# Patient Record
Sex: Male | Born: 1950 | Race: Black or African American | Hispanic: No | Marital: Single | Smoking: Current every day smoker
Health system: Southern US, Community
[De-identification: ages and names within clinical notes are randomized; demographics above are authoritative.]

## PROBLEM LIST (undated history)

## (undated) DIAGNOSIS — F209 Schizophrenia, unspecified: Secondary | ICD-10-CM

## (undated) DIAGNOSIS — E785 Hyperlipidemia, unspecified: Secondary | ICD-10-CM

## (undated) DIAGNOSIS — Z978 Presence of other specified devices: Secondary | ICD-10-CM

## (undated) DIAGNOSIS — R7309 Other abnormal glucose: Secondary | ICD-10-CM

## (undated) DIAGNOSIS — E119 Type 2 diabetes mellitus without complications: Secondary | ICD-10-CM

## (undated) DIAGNOSIS — I10 Essential (primary) hypertension: Secondary | ICD-10-CM

## (undated) DIAGNOSIS — R739 Hyperglycemia, unspecified: Secondary | ICD-10-CM

## (undated) HISTORY — DX: Type 2 diabetes mellitus without complications: E11.9

## (undated) HISTORY — PX: OTHER SURGICAL HISTORY: SHX169

---

## 2019-11-04 ENCOUNTER — Other Ambulatory Visit: Payer: Self-pay

## 2019-11-04 ENCOUNTER — Emergency Department (HOSPITAL_COMMUNITY)
Admission: EM | Admit: 2019-11-04 | Discharge: 2019-11-04 | Disposition: A | Discharge status: Home / Self Care | Payer: Medicare Other | Attending: Emergency Medicine | Admitting: Emergency Medicine

## 2019-11-04 ENCOUNTER — Encounter (HOSPITAL_COMMUNITY): Payer: Self-pay

## 2019-11-04 DIAGNOSIS — Z7689 Persons encountering health services in other specified circumstances: Secondary | ICD-10-CM

## 2019-11-04 DIAGNOSIS — F17210 Nicotine dependence, cigarettes, uncomplicated: Secondary | ICD-10-CM | POA: Insufficient documentation

## 2019-11-04 DIAGNOSIS — Z466 Encounter for fitting and adjustment of urinary device: Secondary | ICD-10-CM | POA: Diagnosis not present

## 2019-11-04 HISTORY — DX: Presence of other specified devices: Z97.8
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
## 2019-11-04 NOTE — Discharge Instructions (Addendum)
Your Foley bag was changed on today's visit.  You have a new appointment at the [facility], their address is attached to your chart.  This appointment is on 11/25/2019 at 9:30 am, please do not miss this appointment.

## 2019-11-04 NOTE — Discharge Planning (Signed)
Dore Oquin J. Clydene Laming, RN, BSN, [place] [phone]  [facility] set up appointment with [facility]  on 12/31 @ 0930.

## 2019-11-25 ENCOUNTER — Other Ambulatory Visit: Payer: Self-pay

## 2019-11-25 ENCOUNTER — Ambulatory Visit (INDEPENDENT_AMBULATORY_CARE_PROVIDER_SITE_OTHER): Payer: Medicare Other | Admitting: Primary Care

## 2019-11-25 DIAGNOSIS — Z9189 Other specified personal risk factors, not elsewhere classified: Secondary | ICD-10-CM | POA: Diagnosis not present

## 2019-11-25 DIAGNOSIS — Z09 Encounter for follow-up examination after completed treatment for conditions other than malignant neoplasm: Secondary | ICD-10-CM | POA: Diagnosis not present

## 2019-11-25 DIAGNOSIS — Z7689 Persons encountering health services in other specified circumstances: Secondary | ICD-10-CM

## 2019-11-25 DIAGNOSIS — R5381 Other malaise: Secondary | ICD-10-CM

## 2019-11-25 NOTE — Progress Notes (Signed)
Pt gives permission for cousin Woody Seller on his behalf
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
## 2019-12-03 ENCOUNTER — Telehealth: Payer: Self-pay

## 2019-12-03 NOTE — Telephone Encounter (Signed)
Call placed to patient # [phone] to discuss home health referral.  His cousin, Elise Benne answered. No DPR on file to speak to [name], he said that he is caring for the patient but was not at home at the time of this call. Instructed him to call this CM back when he is with the patient and he said he would call back to number provided.

## 2019-12-07 ENCOUNTER — Telehealth: Payer: Self-pay
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
## 2019-12-07 NOTE — Telephone Encounter (Signed)
Call received from patient' son, Loura Halt. Provided him with the information to apply for medicaid online : [email or web address]  Also instructed him to contact [facility] to inform them of patient's change in address from [facility] to [facility]. Also provided him the contact information for [facility]  # [phone].  instructed him to call regarding medicare advantage plans, medicare savings program and the extra help program.   Explained to him that he can call this CM back with any questions. He said that he lives in [place] and is trying to make sure that his father receives the services he needs

## 2019-12-08 ENCOUNTER — Telehealth: Payer: Self-pay

## 2019-12-08 NOTE — Telephone Encounter (Addendum)
Call placed to [facility] regarding the referral for SN and PT, spoke to [place] who said that they are not able to accept the referral.  Call placed to [facility], spoke to [place] who said that they are not accepting referrals   Call placed to [facility], spoke to Mike/sales rep who stated that he would reviewed patient information in Epic and determined that they are not able to accept the referral.  Call placed to [facility], spoke to [place], who  requested referral be faxed to # [phone] for review.  Referral faxed as requested

## 2019-12-09 ENCOUNTER — Telehealth: Payer: Self-pay

## 2019-12-09 NOTE — Telephone Encounter (Signed)
Call received from [place]/[facility] who stated that they are not able to accept the referral because they are not in network with patient's insurance.

## 2019-12-10 NOTE — Telephone Encounter (Signed)
Call placed to [facility], spoke to [facility] who said they are not able to accept the referral  Call placed to [facility], spoke to Maralyn Sago who said that they are not able to accept the referral.

## 2019-12-14 ENCOUNTER — Telehealth: Payer: Self-pay

## 2019-12-14 NOTE — Telephone Encounter (Signed)
Call placed to [facility], spoke to [facility] who checked with her manager and said that they can accept the referral.  She requested orders and demographic information be faxed to them # [phone].  Referral then faxed as requested.

## 2020-01-11 ENCOUNTER — Emergency Department (HOSPITAL_COMMUNITY)
Admission: EM | Admit: 2020-01-11 | Discharge: 2020-01-11 | Disposition: A | Discharge status: Home / Self Care | Payer: Medicare Other | Attending: Emergency Medicine | Admitting: Emergency Medicine

## 2020-01-11 ENCOUNTER — Encounter (HOSPITAL_COMMUNITY): Payer: Self-pay

## 2020-01-11 ENCOUNTER — Other Ambulatory Visit: Payer: Self-pay

## 2020-01-11 DIAGNOSIS — Z466 Encounter for fitting and adjustment of urinary device: Secondary | ICD-10-CM | POA: Diagnosis present

## 2020-01-11 DIAGNOSIS — F17210 Nicotine dependence, cigarettes, uncomplicated: Secondary | ICD-10-CM | POA: Diagnosis not present

## 2020-01-11 DIAGNOSIS — Z79899 Other long term (current) drug therapy: Secondary | ICD-10-CM | POA: Diagnosis not present

## 2020-01-11 NOTE — Discharge Instructions (Addendum)
We provided you with supplies today, please follow up for future catheter changes.

## 2020-01-11 NOTE — ED Provider Notes (Signed)
[facility] EMERGENCY DEPARTMENT Provider Note   CSN: [id number] Arrival date & time: 01/11/20  0848     History Chief Complaint  Patient presents with  . Colostomy Bag Leaking    Ricky Hanson is a 69 y.o. male.  69 y.o male with a PMH of chronic foley in place presents to the ED brought in by caregiver for replacement. Caregiver reports bag has been leaking for the past few fays, he is currently trying to establish appointment with a PCP. Caregiver reports urine catheter has not been change in about a month. He reports no complaints from patient, no fevers at home.   The history is provided by a caregiver.       Past Medical History:  Diagnosis Date  . Foley catheter in place     There are no problems to display for this patient.   History reviewed. No pertinent surgical history.     No family history on file.  Social History   Tobacco Use  . Smoking status: Current Every Day Smoker    Packs/day: 0.50    Types: Cigarettes  Substance Use Topics  . Alcohol use: Never  . Drug use: Never    Home Medications Prior to Admission medications   Medication Sig Start Date End Date Taking? Authorizing Provider  benztropine (COGENTIN) 1 MG tablet Take 1 mg by mouth at bedtime.    [provider]  haloperidol decanoate (HALDOL DECANOATE) 100 MG/ML injection Inject 100 mg into the muscle every 28 (twenty-eight) days. Inject 2 mLs every 4 weeks    [provider]    Allergies    Patient has no known allergies.  Review of Systems   Review of Systems  Unable to perform ROS: Patient nonverbal    Physical Exam Updated Vital Signs BP (!) 150/71 (BP Location: Right Arm)   Pulse 84   Temp 98.5 F (36.9 C) (Oral)   Resp 16   SpO2 99%   Physical Exam Vitals and nursing note reviewed. Exam conducted with a chaperone present.  Constitutional:      Appearance: Normal appearance.  HENT:     Head: Normocephalic and atraumatic.   Mouth/Throat:     Mouth: Mucous membranes are moist.  Cardiovascular:     Rate and Rhythm: Normal rate.  Pulmonary:     Effort: Pulmonary effort is normal.  Abdominal:     General: Abdomen is flat.     Tenderness: There is no abdominal tenderness.  Genitourinary:    Comments: Urine catheter in place.  Skin:    General: Skin is warm and dry.  Neurological:     Mental Status: Mental status is at baseline.     ED Results / Procedures / Treatments   Labs (all labs ordered are listed, but only abnormal results are displayed) Labs Reviewed - No data to display  EKG None  Radiology No results found.  Procedures Procedures (including critical care time)  Medications Ordered in ED Medications - No data to display  ED Course  I have reviewed the triage vital signs and the nursing notes.  Pertinent labs & imaging results that were available during my care of the patient were reviewed by me and considered in my medical decision making (see chart for details).    MDM Rules/Calculators/A&P  Patient with history of chronic urine catheter placement presents to the ED with request of catheter changes.  He is brought in by caregiver who reports back has been leaking not  at the site but in the middle of the connector.  He does report that patient is usually seen in the ED to have this bag removed.  I have reviewed his chart and see that he was attempted to be plugged into the system in order to obtain follow-up care.  Caregiver was instructed on how to change urine catheter, they are advised to continue to seek follow-up for recurrent needs.  They agree with plan and management, patient here with no complaints, fevers, none reported by caregiver.    Portions of this note were generated with Lobbyist. Dictation errors may occur despite best attempts at proofreading.  Final Clinical Impression(s) / ED Diagnoses Final diagnoses:  Catheter (urine) change required     Rx / DC Orders ED Discharge Orders    None       Janeece Fitting, PA-C 01/11/20 0930    Malvin Johns, MD 01/11/20 1008

## 2020-01-11 NOTE — ED Triage Notes (Signed)
Pt accompanied by caregiver who states they ran out of colostomy supplies and it is now leaking. Pt alert, nad noted, pt has no complaints

## 2020-01-11 NOTE — ED Notes (Signed)
Patient does not have colostomy bag, patient has foley catheter and leg bag is leaking. Bag changed out by RN and extra supplies given to caregiver for home use.
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
## 2020-02-02 ENCOUNTER — Other Ambulatory Visit: Payer: Self-pay

## 2020-02-02 ENCOUNTER — Ambulatory Visit: Payer: Medicare Other | Attending: Family Medicine | Admitting: Physician Assistant

## 2020-02-02 VITALS — BP 109/70 | HR 81 | Temp 97.5°F | Resp 16 | Ht 73.0 in | Wt 186.6 lb

## 2020-02-02 DIAGNOSIS — Z09 Encounter for follow-up examination after completed treatment for conditions other than malignant neoplasm: Secondary | ICD-10-CM

## 2020-02-02 DIAGNOSIS — Z978 Presence of other specified devices: Secondary | ICD-10-CM

## 2020-02-02 DIAGNOSIS — Z79899 Other long term (current) drug therapy: Secondary | ICD-10-CM

## 2020-02-02 DIAGNOSIS — Z13220 Encounter for screening for lipoid disorders: Secondary | ICD-10-CM

## 2020-02-02 DIAGNOSIS — Z960 Presence of urogenital implants: Secondary | ICD-10-CM | POA: Diagnosis not present

## 2020-02-02 DIAGNOSIS — H524 Presbyopia: Secondary | ICD-10-CM

## 2020-02-02 DIAGNOSIS — Q846 Other congenital malformations of nails: Secondary | ICD-10-CM

## 2020-02-02 DIAGNOSIS — Z131 Encounter for screening for diabetes mellitus: Secondary | ICD-10-CM

## 2020-02-03 LAB — LIPID PANEL
Chol/HDL Ratio: 2.8 ratio (ref 0.0–5.0)
Cholesterol, Total: 182 mg/dL (ref 100–199)
HDL: 64 mg/dL (ref >39)
LDL Chol Calc (NIH): 105 mg/dL — ABNORMAL HIGH (ref 0–99)
Triglycerides: 67 mg/dL (ref 0–149)
VLDL Cholesterol Cal: 13 mg/dL (ref 5–40)

## 2020-02-03 LAB — CBC WITH DIFFERENTIAL/PLATELET
Basophils Absolute: 0.1 10*3/uL (ref 0.0–0.2)
Basos: 1 %
EOS (ABSOLUTE): 0.2 10*3/uL (ref 0.0–0.4)
Eos: 3 %
Hematocrit: 37.3 % — ABNORMAL LOW (ref 37.5–51.0)
Hemoglobin: 12.1 g/dL — ABNORMAL LOW (ref 13.0–17.7)
Immature Grans (Abs): 0 10*3/uL (ref 0.0–0.1)
Immature Granulocytes: 0 %
Lymphocytes Absolute: 2 10*3/uL (ref 0.7–3.1)
Lymphs: 28 %
MCH: 28.7 pg (ref 26.6–33.0)
MCHC: 32.4 g/dL (ref 31.5–35.7)
MCV: 88 fL (ref 79–97)
Monocytes Absolute: 0.4 10*3/uL (ref 0.1–0.9)
Monocytes: 5 %
Neutrophils Absolute: 4.4 10*3/uL (ref 1.4–7.0)
Neutrophils: 63 %
Platelets: 221 10*3/uL (ref 150–450)
RBC: 4.22 x10E6/uL (ref 4.14–5.80)
RDW: 12.7 % (ref 11.6–15.4)
WBC: 7.1 10*3/uL (ref 3.4–10.8)

## 2020-02-03 LAB — COMPREHENSIVE METABOLIC PANEL
ALT: 9 IU/L (ref 0–44)
AST: 13 IU/L (ref 0–40)
Albumin/Globulin Ratio: 1.4 (ref 1.2–2.2)
Albumin: 4.1 g/dL (ref 3.8–4.8)
Alkaline Phosphatase: 102 IU/L (ref 39–117)
BUN/Creatinine Ratio: 12 (ref 10–24)
BUN: 16 mg/dL (ref 8–27)
Bilirubin Total: 0.5 mg/dL (ref 0.0–1.2)
CO2: 23 mmol/L (ref 20–29)
Calcium: 9.3 mg/dL (ref 8.6–10.2)
Chloride: 101 mmol/L (ref 96–106)
Creatinine, Ser: 1.32 mg/dL — ABNORMAL HIGH (ref 0.76–1.27)
GFR calc Af Amer: 65 mL/min/{1.73_m2} (ref >59)
GFR calc non Af Amer: 56 mL/min/{1.73_m2} — ABNORMAL LOW (ref >59)
Globulin, Total: 3 g/dL (ref 1.5–4.5)
Glucose: 137 mg/dL — ABNORMAL HIGH (ref 65–99)
Potassium: 4.6 mmol/L (ref 3.5–5.2)
Sodium: 140 mmol/L (ref 134–144)
Total Protein: 7.1 g/dL (ref 6.0–8.5)

## 2020-02-03 LAB — HEMOGLOBIN A1C
Est. average glucose Bld gHb Est-mCnc: 111 mg/dL
Hgb A1c MFr Bld: 5.5 % (ref 4.8–5.6)

## 2020-02-04 ENCOUNTER — Telehealth: Payer: Self-pay

## 2020-02-04 NOTE — Telephone Encounter (Signed)
Entry in error

## 2020-02-23 ENCOUNTER — Telehealth: Payer: Self-pay | Admitting: Licensed Clinical Social Worker

## 2020-02-23 NOTE — Telephone Encounter (Signed)
MSW Intern placed call to patient to follow up regarding social work referral placed by [employer] 02/02/20. MSW Intern introduced self and role at clinic. Patient states he is focusing on getting established with providers after recent move to [place]. This MSW Intern provided patient with contact information for Jenel Lucks, LCSW if additional support is needed.

## 2020-03-29 ENCOUNTER — Ambulatory Visit: Payer: Medicare Other | Attending: Family Medicine | Admitting: Family Medicine

## 2020-03-29 ENCOUNTER — Other Ambulatory Visit: Payer: Self-pay

## 2020-03-31 ENCOUNTER — Other Ambulatory Visit: Payer: Self-pay

## 2020-03-31 ENCOUNTER — Encounter: Payer: Self-pay | Admitting: Podiatry

## 2020-03-31 ENCOUNTER — Ambulatory Visit (INDEPENDENT_AMBULATORY_CARE_PROVIDER_SITE_OTHER): Payer: Medicare Other | Admitting: Podiatry

## 2020-03-31 DIAGNOSIS — M79675 Pain in left toe(s): Secondary | ICD-10-CM

## 2020-03-31 DIAGNOSIS — S98131S Complete traumatic amputation of one right lesser toe, sequela: Secondary | ICD-10-CM

## 2020-03-31 DIAGNOSIS — B351 Tinea unguium: Secondary | ICD-10-CM | POA: Diagnosis not present

## 2020-03-31 DIAGNOSIS — M2041 Other hammer toe(s) (acquired), right foot: Secondary | ICD-10-CM | POA: Insufficient documentation

## 2020-03-31 DIAGNOSIS — S98139A Complete traumatic amputation of one unspecified lesser toe, initial encounter: Secondary | ICD-10-CM | POA: Insufficient documentation

## 2020-03-31 DIAGNOSIS — M2042 Other hammer toe(s) (acquired), left foot: Secondary | ICD-10-CM

## 2020-03-31 DIAGNOSIS — M79674 Pain in right toe(s): Secondary | ICD-10-CM
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
## 2020-04-06 ENCOUNTER — Other Ambulatory Visit: Payer: Self-pay | Admitting: Podiatry

## 2020-04-06 ENCOUNTER — Other Ambulatory Visit: Payer: Self-pay

## 2020-04-06 ENCOUNTER — Ambulatory Visit (INDEPENDENT_AMBULATORY_CARE_PROVIDER_SITE_OTHER): Payer: Medicare Other

## 2020-04-06 ENCOUNTER — Encounter: Payer: Self-pay | Admitting: Podiatry

## 2020-04-06 ENCOUNTER — Telehealth: Payer: Self-pay | Admitting: *Deleted

## 2020-04-06 ENCOUNTER — Ambulatory Visit (INDEPENDENT_AMBULATORY_CARE_PROVIDER_SITE_OTHER): Payer: Medicare Other | Admitting: Podiatry

## 2020-04-06 VITALS — Temp 97.2°F

## 2020-04-06 DIAGNOSIS — M2042 Other hammer toe(s) (acquired), left foot: Secondary | ICD-10-CM

## 2020-04-06 DIAGNOSIS — L97511 Non-pressure chronic ulcer of other part of right foot limited to breakdown of skin: Secondary | ICD-10-CM

## 2020-04-06 DIAGNOSIS — I999 Unspecified disorder of circulatory system: Secondary | ICD-10-CM

## 2020-04-06 DIAGNOSIS — M79671 Pain in right foot: Secondary | ICD-10-CM

## 2020-04-06 DIAGNOSIS — M2041 Other hammer toe(s) (acquired), right foot: Secondary | ICD-10-CM | POA: Diagnosis not present

## 2020-04-06 MED ORDER — DOXYCYCLINE HYCLATE 100 MG PO TABS: 100.0000 mg | ORAL_TABLET | Freq: Two times a day (BID) | ORAL | 0 refills | Status: DC

## 2020-04-06 NOTE — Telephone Encounter (Signed)
Orders faxed to [facility]. 

## 2020-04-06 NOTE — Progress Notes (Signed)
Subjective:   Patient ID: Ricky Hanson, male   DOB: 69 y.o.   MRN: [id number]   HPI Patient presents stating he has developed some breakdown of tissue on the dorsum of the second digit right and he presents with his cousin who is his caregiver as he is not a good historian.  He does have diabetes and does not practice good hygiene   ROS      Objective:  Physical Exam  Neurovascular status showing diminishment of PT DP pulses right over left with moderate edema of the right foot and discoloration right second toe with a dorsal breakdown of tissue localized measuring 5 x 5 mm and is localized with skin breakdown with no subcutaneous exposure.  Has had amputation of the right hallux     Assessment:  Ulceration second digit right with probable trauma in a patient who is not a good historian     Plan:  H&P reviewed with cousin that this may require ultimate amputation and at this point I flushed the area I applied wet-to-dry dressing which she will do at home and surgical shoe to prevent pressure on this along with doxycycline.  We are getting get vascular studies to confirm circulatory status and they understand there is high risk that he is going to require amputation  X-rays were negative for signs of osteolysis currently appears so far to be soft tissue

## 2020-04-16 ENCOUNTER — Other Ambulatory Visit: Payer: Self-pay

## 2020-04-16 ENCOUNTER — Emergency Department (HOSPITAL_COMMUNITY): Payer: Medicare Other

## 2020-04-16 ENCOUNTER — Emergency Department (HOSPITAL_COMMUNITY)
Admission: EM | Admit: 2020-04-16 | Discharge: 2020-04-16 | Disposition: A | Discharge status: Home / Self Care | Payer: Medicare Other | Attending: Emergency Medicine | Admitting: Emergency Medicine

## 2020-04-16 ENCOUNTER — Encounter (HOSPITAL_COMMUNITY): Payer: Self-pay | Admitting: Emergency Medicine

## 2020-04-16 DIAGNOSIS — L97519 Non-pressure chronic ulcer of other part of right foot with unspecified severity: Secondary | ICD-10-CM | POA: Diagnosis not present

## 2020-04-16 DIAGNOSIS — E119 Type 2 diabetes mellitus without complications: Secondary | ICD-10-CM | POA: Diagnosis not present

## 2020-04-16 DIAGNOSIS — F17210 Nicotine dependence, cigarettes, uncomplicated: Secondary | ICD-10-CM | POA: Diagnosis not present

## 2020-04-16 DIAGNOSIS — Z466 Encounter for fitting and adjustment of urinary device: Secondary | ICD-10-CM | POA: Insufficient documentation

## 2020-04-16 DIAGNOSIS — T83018A Breakdown (mechanical) of other indwelling urethral catheter, initial encounter: Secondary | ICD-10-CM

## 2020-04-16 DIAGNOSIS — Z79899 Other long term (current) drug therapy: Secondary | ICD-10-CM | POA: Insufficient documentation

## 2020-04-16 LAB — CBG MONITORING, ED: Glucose-Capillary: 107 mg/dL — ABNORMAL HIGH (ref 70–99)
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
## 2020-04-16 NOTE — ED Notes (Signed)
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

## 2020-04-16 NOTE — ED Notes (Signed)
Pt refused blood work, dressing applied to wound and pt and family states they are ready for discharge.

## 2020-04-16 NOTE — Discharge Instructions (Addendum)
Follow-up closely with your foot/wound doctor and your urologist as previously arranged.  Return for fevers or new or worsening symptoms.

## 2020-04-16 NOTE — ED Triage Notes (Signed)
Patient requesting leg bag replacement that started leaking this afternoon .

## 2020-04-20 ENCOUNTER — Other Ambulatory Visit: Payer: Self-pay | Admitting: Podiatry

## 2020-04-20 ENCOUNTER — Ambulatory Visit (HOSPITAL_COMMUNITY)
Admission: RE | Admit: 2020-04-20 | Discharge: 2020-04-20 | Disposition: A | Discharge status: Home / Self Care | Payer: Medicare Other | Source: Ambulatory Visit | Attending: Cardiology | Admitting: Cardiology

## 2020-04-20 ENCOUNTER — Other Ambulatory Visit: Payer: Self-pay

## 2020-04-20 ENCOUNTER — Telehealth: Payer: Self-pay

## 2020-04-20 DIAGNOSIS — I999 Unspecified disorder of circulatory system: Secondary | ICD-10-CM

## 2020-04-20 DIAGNOSIS — L97511 Non-pressure chronic ulcer of other part of right foot limited to breakdown of skin: Secondary | ICD-10-CM | POA: Insufficient documentation

## 2020-04-20 NOTE — Telephone Encounter (Signed)
This CM spoke to patient's caregiver who was inquiring about personal care services for patient. He does not have medicaid.  Explained to him that that long term PCS are provided through medicaid.  He will first need to apply for medicaid for the patient. Provided him with the phone number to contact DSS for further information. He said that he did not have access to a computer to apply on line and requested that this CM mail him an application.   Application mailed as requested.

## 2020-04-21 ENCOUNTER — Ambulatory Visit (INDEPENDENT_AMBULATORY_CARE_PROVIDER_SITE_OTHER): Payer: Medicare Other | Admitting: Podiatry

## 2020-04-21 ENCOUNTER — Encounter: Payer: Self-pay | Admitting: Podiatry

## 2020-04-21 VITALS — Temp 97.0°F

## 2020-04-21 DIAGNOSIS — L97511 Non-pressure chronic ulcer of other part of right foot limited to breakdown of skin: Secondary | ICD-10-CM | POA: Diagnosis not present

## 2020-04-21 DIAGNOSIS — I999 Unspecified disorder of circulatory system: Secondary | ICD-10-CM

## 2020-04-25 ENCOUNTER — Ambulatory Visit: Payer: Medicare Other | Admitting: Cardiovascular Disease

## 2020-04-25 NOTE — Progress Notes (Signed)
Subjective:   Patient ID: Ricky Hanson, male   DOB: 69 y.o.   MRN: [id number]   HPI Patient presents stating that I still am having some breakdown of tissue on the second digit of the right foot and slightly on the top of the foot.  Patient states there is been not any pus or odors and the dressing is changed no fever chills is noted and patient is due to see a vascular doctor after review results of vascular studies   ROS      Objective:  Physical Exam  Neurovascular status significantly compromised with patient found to have a discoloration of the right second digit extending from the proximal phalanx distal with a small area of open dorsal tissue localized measuring about 4 x 4 millimeter with no proximal edema erythema drainage noted.  On top of the right foot on the medial side there is an approximate 1.5 cm x 1 cm area that is very superficial with no subcutaneous exposure it appears to be more due to irritation.  Patient is due to see the doctor on June 1     Assessment:  Patient with significant vascular disease who most likely is going to lose his second toe but it is stable currently and has a small skin ulceration dorsal     Plan:  H&P and cleaned all room wounds today and applied sterile dressings.  I reviewed with his caregiver that he is due to see the vascular doctor on Tuesday or hoping that increased blood flow can be brought to the area even though it is highly likely he is going to lose the second toe but it would be best if we can first get his circulation increased to ensure healing.  They understand this and they understand if anything were to worsen or if you develop any indication of systemic infection he is to go straight to the emergency room and they are given our numbers and are encouraged to call with questions concerns and we will see them back after he meets with the vascular surgeon on Tuesday

## 2020-04-28 ENCOUNTER — Telehealth: Payer: Self-pay

## 2020-04-28 NOTE — Telephone Encounter (Signed)
Patient called and he was given the address to the clinic so that he can mail back paperwork that was sent to him.

## 2020-05-08 LAB — HM DIABETES EYE EXAM

## 2020-05-15 ENCOUNTER — Other Ambulatory Visit: Payer: Self-pay

## 2020-05-15 ENCOUNTER — Encounter (HOSPITAL_COMMUNITY): Payer: Self-pay

## 2020-05-15 ENCOUNTER — Inpatient Hospital Stay (HOSPITAL_COMMUNITY)
Admission: AD | Admit: 2020-05-15 | Discharge: 2020-05-23 | DRG: 854 | Disposition: A | Discharge status: Skilled Nursing Facility | Payer: Medicare Other | Source: Ambulatory Visit | Attending: Family Medicine | Admitting: Family Medicine

## 2020-05-15 ENCOUNTER — Ambulatory Visit (INDEPENDENT_AMBULATORY_CARE_PROVIDER_SITE_OTHER): Payer: Medicare Other

## 2020-05-15 ENCOUNTER — Telehealth: Payer: Self-pay | Admitting: *Deleted

## 2020-05-15 ENCOUNTER — Ambulatory Visit (INDEPENDENT_AMBULATORY_CARE_PROVIDER_SITE_OTHER): Payer: Medicare Other | Admitting: Podiatry

## 2020-05-15 DIAGNOSIS — R609 Edema, unspecified: Secondary | ICD-10-CM | POA: Diagnosis present

## 2020-05-15 DIAGNOSIS — D509 Iron deficiency anemia, unspecified: Secondary | ICD-10-CM | POA: Diagnosis present

## 2020-05-15 DIAGNOSIS — I152 Hypertension secondary to endocrine disorders: Secondary | ICD-10-CM | POA: Diagnosis present

## 2020-05-15 DIAGNOSIS — A419 Sepsis, unspecified organism: Secondary | ICD-10-CM | POA: Diagnosis not present

## 2020-05-15 DIAGNOSIS — I1310 Hypertensive heart and chronic kidney disease without heart failure, with stage 1 through stage 4 chronic kidney disease, or unspecified chronic kidney disease: Secondary | ICD-10-CM | POA: Diagnosis present

## 2020-05-15 DIAGNOSIS — E861 Hypovolemia: Secondary | ICD-10-CM | POA: Diagnosis present

## 2020-05-15 DIAGNOSIS — E1159 Type 2 diabetes mellitus with other circulatory complications: Secondary | ICD-10-CM | POA: Diagnosis present

## 2020-05-15 DIAGNOSIS — N1831 Chronic kidney disease, stage 3a: Secondary | ICD-10-CM | POA: Diagnosis present

## 2020-05-15 DIAGNOSIS — R0602 Shortness of breath: Secondary | ICD-10-CM | POA: Diagnosis not present

## 2020-05-15 DIAGNOSIS — L03115 Cellulitis of right lower limb: Secondary | ICD-10-CM | POA: Diagnosis present

## 2020-05-15 DIAGNOSIS — L97519 Non-pressure chronic ulcer of other part of right foot with unspecified severity: Secondary | ICD-10-CM | POA: Diagnosis present

## 2020-05-15 DIAGNOSIS — E785 Hyperlipidemia, unspecified: Secondary | ICD-10-CM | POA: Diagnosis present

## 2020-05-15 DIAGNOSIS — D649 Anemia, unspecified: Secondary | ICD-10-CM | POA: Diagnosis present

## 2020-05-15 DIAGNOSIS — E1152 Type 2 diabetes mellitus with diabetic peripheral angiopathy with gangrene: Secondary | ICD-10-CM | POA: Diagnosis present

## 2020-05-15 DIAGNOSIS — L97511 Non-pressure chronic ulcer of other part of right foot limited to breakdown of skin: Secondary | ICD-10-CM

## 2020-05-15 DIAGNOSIS — Z89411 Acquired absence of right great toe: Secondary | ICD-10-CM

## 2020-05-15 DIAGNOSIS — E871 Hypo-osmolality and hyponatremia: Secondary | ICD-10-CM | POA: Diagnosis present

## 2020-05-15 DIAGNOSIS — I739 Peripheral vascular disease, unspecified: Secondary | ICD-10-CM | POA: Diagnosis not present

## 2020-05-15 DIAGNOSIS — I96 Gangrene, not elsewhere classified: Secondary | ICD-10-CM

## 2020-05-15 DIAGNOSIS — Z79899 Other long term (current) drug therapy: Secondary | ICD-10-CM

## 2020-05-15 DIAGNOSIS — F209 Schizophrenia, unspecified: Secondary | ICD-10-CM | POA: Diagnosis present

## 2020-05-15 DIAGNOSIS — E1122 Type 2 diabetes mellitus with diabetic chronic kidney disease: Secondary | ICD-10-CM | POA: Diagnosis present

## 2020-05-15 DIAGNOSIS — N183 Chronic kidney disease, stage 3 unspecified: Secondary | ICD-10-CM | POA: Diagnosis present

## 2020-05-15 DIAGNOSIS — E1169 Type 2 diabetes mellitus with other specified complication: Secondary | ICD-10-CM | POA: Diagnosis present

## 2020-05-15 DIAGNOSIS — N179 Acute kidney failure, unspecified: Secondary | ICD-10-CM | POA: Diagnosis present

## 2020-05-15 DIAGNOSIS — I70261 Atherosclerosis of native arteries of extremities with gangrene, right leg: Secondary | ICD-10-CM | POA: Diagnosis present

## 2020-05-15 DIAGNOSIS — R0989 Other specified symptoms and signs involving the circulatory and respiratory systems: Secondary | ICD-10-CM | POA: Diagnosis present

## 2020-05-15 DIAGNOSIS — E1165 Type 2 diabetes mellitus with hyperglycemia: Secondary | ICD-10-CM | POA: Diagnosis present

## 2020-05-15 DIAGNOSIS — E1140 Type 2 diabetes mellitus with diabetic neuropathy, unspecified: Secondary | ICD-10-CM | POA: Diagnosis present

## 2020-05-15 DIAGNOSIS — M869 Osteomyelitis, unspecified: Secondary | ICD-10-CM | POA: Diagnosis present

## 2020-05-15 DIAGNOSIS — Z20822 Contact with and (suspected) exposure to covid-19: Secondary | ICD-10-CM | POA: Diagnosis present

## 2020-05-15 DIAGNOSIS — F17210 Nicotine dependence, cigarettes, uncomplicated: Secondary | ICD-10-CM | POA: Diagnosis present

## 2020-05-15 HISTORY — DX: Hyperglycemia, unspecified: R73.9

## 2020-05-15 HISTORY — DX: Hyperlipidemia, unspecified: E78.5

## 2020-05-15 HISTORY — DX: Essential (primary) hypertension: I10

## 2020-05-15 HISTORY — DX: Schizophrenia, unspecified: F20.9

## 2020-05-15 HISTORY — DX: Other abnormal glucose: R73.09

## 2020-05-15 LAB — CBC WITH DIFFERENTIAL/PLATELET
Abs Immature Granulocytes: 0.11 10*3/uL — ABNORMAL HIGH (ref 0.00–0.07)
Basophils Absolute: 0.1 10*3/uL (ref 0.0–0.1)
Basophils Relative: 0 %
Eosinophils Absolute: 0.1 10*3/uL (ref 0.0–0.5)
Eosinophils Relative: 0 %
HCT: 31.8 % — ABNORMAL LOW (ref 39.0–52.0)
Hemoglobin: 10.3 g/dL — ABNORMAL LOW (ref 13.0–17.0)
Immature Granulocytes: 1 %
Lymphocytes Relative: 9 %
Lymphs Abs: 1.9 10*3/uL (ref 0.7–4.0)
MCH: 28 pg (ref 26.0–34.0)
MCHC: 32.4 g/dL (ref 30.0–36.0)
MCV: 86.4 fL (ref 80.0–100.0)
Monocytes Absolute: 1.3 10*3/uL — ABNORMAL HIGH (ref 0.1–1.0)
Monocytes Relative: 7 %
Neutro Abs: 17.1 10*3/uL — ABNORMAL HIGH (ref 1.7–7.7)
Neutrophils Relative %: 83 %
Platelets: 323 10*3/uL (ref 150–400)
RBC: 3.68 MIL/uL — ABNORMAL LOW (ref 4.22–5.81)
RDW: 13.4 % (ref 11.5–15.5)
WBC: 20.6 10*3/uL — ABNORMAL HIGH (ref 4.0–10.5)
nRBC: 0 % (ref 0.0–0.2)

## 2020-05-15 LAB — COMPREHENSIVE METABOLIC PANEL
ALT: 40 U/L (ref 0–44)
AST: 24 U/L (ref 15–41)
Albumin: 3 g/dL — ABNORMAL LOW (ref 3.5–5.0)
Alkaline Phosphatase: 140 U/L — ABNORMAL HIGH (ref 38–126)
Anion gap: 10 (ref 5–15)
BUN: 15 mg/dL (ref 8–23)
CO2: 21 mmol/L — ABNORMAL LOW (ref 22–32)
Calcium: 9 mg/dL (ref 8.9–10.3)
Chloride: 99 mmol/L (ref 98–111)
Creatinine, Ser: 1.57 mg/dL — ABNORMAL HIGH (ref 0.61–1.24)
GFR calc Af Amer: 52 mL/min — ABNORMAL LOW (ref >60)
GFR calc non Af Amer: 45 mL/min — ABNORMAL LOW (ref >60)
Glucose, Bld: 159 mg/dL — ABNORMAL HIGH (ref 70–99)
Potassium: 4.5 mmol/L (ref 3.5–5.1)
Sodium: 130 mmol/L — ABNORMAL LOW (ref 135–145)
Total Bilirubin: 0.8 mg/dL (ref 0.3–1.2)
Total Protein: 7.6 g/dL (ref 6.5–8.1)

## 2020-05-15 LAB — LACTIC ACID, PLASMA: Lactic Acid, Venous: 1.6 mmol/L (ref 0.5–1.9)

## 2020-05-15 MED ORDER — SODIUM CHLORIDE 0.9% FLUSH: 3.0000 mL | Freq: Once | INTRAVENOUS | Status: DC

## 2020-05-15 NOTE — ED Triage Notes (Signed)
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 Caregiver reports that patient has had ongoing foot infection since October and no improvement with antibiotics. Patient here for surgery, increased pain and swelling to toe

## 2020-05-15 NOTE — Telephone Encounter (Signed)
[facility] ED - Ricky Hanson states will transfer to Triage -Ricky Hanson was informed of pt's status on the way in private vehicle for gangrene in feet.

## 2020-05-15 NOTE — Progress Notes (Signed)
° °  Subjective: 69 year old male presents the office today for concerns of malodor, gangrene to his right second toe.  Been under the care of Dr. Charlsie Merles and last seen March 28.  He also had arterial studies performed on May 27.  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  Appears in the chart get an appointment with Dr. York Ram scheduled but it looks like the patient did not show up for this appointment. He presents today for concern of worsening of the wound, infection. Denies any systemic complaints such as fevers, chills, nausea, vomiting. No acute changes since last appointment, and no other complaints at this time.   Arterial duplex 04/20/2020 Summary:    Right: 30-49% stenosis noted in the superficial femoral artery. 50-74%  stenosis noted in the anterior tibial artery followed by suspected  occlusion vs. collateral flow distally. Total occlusion noted in the  posterior tibial artery. Total occlusion noted  in the peroneal artery.   Left: 50-74% stenosis noted in the superficial femoral artery. Total  occlusion noted in the anterior tibial artery with reconstitution of flow  in the distal anterior tibial artery. Total occlusion noted in the  posterior tibial artery. Total occlusion  noted in the peroneal artery.   Objective: NAD-presents the office with his cousin. Nonpalpable pulses present on the right side. Previous imitation of the hallux Extensive gangrenous changes present the second toe with malodor.  Also gangrenous changes present the fourth toe.  Odor coming from the wound.  Swelling to the foot as well as warmth of the foot.      Assessment: Gangrene right second toe, PAD, cellulitis  Plan: -All treatment options discussed with the patient including all alternatives, risks, complications.  -At this point given the extensive changes most concerning for infection I recommend him to be  admitted to the hospital.  He has been going to [facility] emergency department.  Vascular surgery needs to be consulted in order to evaluate circulation status and I will also be happy to follow the patient as well.  I discussed this at length with the patient as well as there is concern that this is a limb threatening issue.  Minimal need second toe amputation but possibly TMA versus proximal. -Patient encouraged to call the office with any questions, concerns, change in symptoms.   Vivi Barrack DPM

## 2020-05-16 ENCOUNTER — Emergency Department (HOSPITAL_COMMUNITY): Payer: Medicare Other

## 2020-05-16 ENCOUNTER — Telehealth: Payer: Self-pay | Admitting: Podiatry

## 2020-05-16 ENCOUNTER — Inpatient Hospital Stay (HOSPITAL_COMMUNITY): Payer: Medicare Other

## 2020-05-16 ENCOUNTER — Encounter (HOSPITAL_COMMUNITY): Payer: Self-pay | Admitting: Student in an Organized Health Care Education/Training Program

## 2020-05-16 DIAGNOSIS — M869 Osteomyelitis, unspecified: Secondary | ICD-10-CM | POA: Diagnosis present

## 2020-05-16 DIAGNOSIS — N1831 Chronic kidney disease, stage 3a: Secondary | ICD-10-CM

## 2020-05-16 DIAGNOSIS — Z20822 Contact with and (suspected) exposure to covid-19: Secondary | ICD-10-CM | POA: Diagnosis present

## 2020-05-16 DIAGNOSIS — L0390 Cellulitis, unspecified: Secondary | ICD-10-CM | POA: Diagnosis not present

## 2020-05-16 DIAGNOSIS — I10 Essential (primary) hypertension: Secondary | ICD-10-CM | POA: Diagnosis not present

## 2020-05-16 DIAGNOSIS — E1152 Type 2 diabetes mellitus with diabetic peripheral angiopathy with gangrene: Secondary | ICD-10-CM | POA: Diagnosis present

## 2020-05-16 DIAGNOSIS — E1140 Type 2 diabetes mellitus with diabetic neuropathy, unspecified: Secondary | ICD-10-CM | POA: Diagnosis present

## 2020-05-16 DIAGNOSIS — Z89411 Acquired absence of right great toe: Secondary | ICD-10-CM | POA: Diagnosis not present

## 2020-05-16 DIAGNOSIS — R0602 Shortness of breath: Secondary | ICD-10-CM | POA: Diagnosis present

## 2020-05-16 DIAGNOSIS — D649 Anemia, unspecified: Secondary | ICD-10-CM | POA: Diagnosis present

## 2020-05-16 DIAGNOSIS — N183 Chronic kidney disease, stage 3 unspecified: Secondary | ICD-10-CM | POA: Diagnosis present

## 2020-05-16 DIAGNOSIS — L97519 Non-pressure chronic ulcer of other part of right foot with unspecified severity: Secondary | ICD-10-CM | POA: Diagnosis present

## 2020-05-16 DIAGNOSIS — R609 Edema, unspecified: Secondary | ICD-10-CM | POA: Diagnosis present

## 2020-05-16 DIAGNOSIS — E1159 Type 2 diabetes mellitus with other circulatory complications: Secondary | ICD-10-CM | POA: Diagnosis not present

## 2020-05-16 DIAGNOSIS — E785 Hyperlipidemia, unspecified: Secondary | ICD-10-CM | POA: Diagnosis present

## 2020-05-16 DIAGNOSIS — M86679 Other chronic osteomyelitis, unspecified ankle and foot: Secondary | ICD-10-CM | POA: Diagnosis not present

## 2020-05-16 DIAGNOSIS — E1122 Type 2 diabetes mellitus with diabetic chronic kidney disease: Secondary | ICD-10-CM | POA: Diagnosis present

## 2020-05-16 DIAGNOSIS — N179 Acute kidney failure, unspecified: Secondary | ICD-10-CM | POA: Diagnosis not present

## 2020-05-16 DIAGNOSIS — I70261 Atherosclerosis of native arteries of extremities with gangrene, right leg: Secondary | ICD-10-CM | POA: Diagnosis present

## 2020-05-16 DIAGNOSIS — R6520 Severe sepsis without septic shock: Secondary | ICD-10-CM | POA: Diagnosis not present

## 2020-05-16 DIAGNOSIS — I96 Gangrene, not elsewhere classified: Secondary | ICD-10-CM

## 2020-05-16 DIAGNOSIS — M86171 Other acute osteomyelitis, right ankle and foot: Secondary | ICD-10-CM | POA: Diagnosis not present

## 2020-05-16 DIAGNOSIS — A419 Sepsis, unspecified organism: Principal | ICD-10-CM | POA: Diagnosis present

## 2020-05-16 DIAGNOSIS — E861 Hypovolemia: Secondary | ICD-10-CM | POA: Diagnosis present

## 2020-05-16 DIAGNOSIS — Z79899 Other long term (current) drug therapy: Secondary | ICD-10-CM | POA: Diagnosis not present

## 2020-05-16 DIAGNOSIS — E1169 Type 2 diabetes mellitus with other specified complication: Secondary | ICD-10-CM | POA: Diagnosis present

## 2020-05-16 DIAGNOSIS — I739 Peripheral vascular disease, unspecified: Secondary | ICD-10-CM | POA: Diagnosis not present

## 2020-05-16 DIAGNOSIS — R011 Cardiac murmur, unspecified: Secondary | ICD-10-CM | POA: Diagnosis not present

## 2020-05-16 DIAGNOSIS — L03115 Cellulitis of right lower limb: Secondary | ICD-10-CM | POA: Diagnosis present

## 2020-05-16 DIAGNOSIS — I152 Hypertension secondary to endocrine disorders: Secondary | ICD-10-CM | POA: Diagnosis present

## 2020-05-16 DIAGNOSIS — F17210 Nicotine dependence, cigarettes, uncomplicated: Secondary | ICD-10-CM | POA: Diagnosis present

## 2020-05-16 DIAGNOSIS — R0989 Other specified symptoms and signs involving the circulatory and respiratory systems: Secondary | ICD-10-CM | POA: Diagnosis present

## 2020-05-16 DIAGNOSIS — E871 Hypo-osmolality and hyponatremia: Secondary | ICD-10-CM | POA: Diagnosis present

## 2020-05-16 DIAGNOSIS — D509 Iron deficiency anemia, unspecified: Secondary | ICD-10-CM | POA: Diagnosis present

## 2020-05-16 DIAGNOSIS — E1165 Type 2 diabetes mellitus with hyperglycemia: Secondary | ICD-10-CM | POA: Diagnosis present

## 2020-05-16 DIAGNOSIS — F209 Schizophrenia, unspecified: Secondary | ICD-10-CM | POA: Diagnosis present

## 2020-05-16 DIAGNOSIS — I1310 Hypertensive heart and chronic kidney disease without heart failure, with stage 1 through stage 4 chronic kidney disease, or unspecified chronic kidney disease: Secondary | ICD-10-CM | POA: Diagnosis present

## 2020-05-16 LAB — URINALYSIS, ROUTINE W REFLEX MICROSCOPIC
Bilirubin Urine: NEGATIVE
Glucose, UA: NEGATIVE mg/dL
Ketones, ur: NEGATIVE mg/dL
Nitrite: NEGATIVE
Protein, ur: 30 mg/dL — AB
Specific Gravity, Urine: 1.01 (ref 1.005–1.030)
WBC, UA: >50 WBC/hpf — ABNORMAL HIGH (ref 0–5)
pH: 5 (ref 5.0–8.0)

## 2020-05-16 LAB — PROTIME-INR
INR: 1.2 (ref 0.8–1.2)
Prothrombin Time: 14.7 seconds (ref 11.4–15.2)

## 2020-05-16 LAB — LACTIC ACID, PLASMA
Lactic Acid, Venous: 1.7 mmol/L (ref 0.5–1.9)
Lactic Acid, Venous: 1.8 mmol/L (ref 0.5–1.9)

## 2020-05-16 LAB — SARS CORONAVIRUS 2 BY RT PCR (HOSPITAL ORDER, PERFORMED IN ~~LOC~~ HOSPITAL LAB): SARS Coronavirus 2: NEGATIVE

## 2020-05-16 LAB — TYPE AND SCREEN
ABO/RH(D): O POS
Antibody Screen: NEGATIVE

## 2020-05-16 LAB — CBC
HCT: 27.8 % — ABNORMAL LOW (ref 39.0–52.0)
Hemoglobin: 9.1 g/dL — ABNORMAL LOW (ref 13.0–17.0)
MCH: 27.2 pg (ref 26.0–34.0)
MCHC: 32.7 g/dL (ref 30.0–36.0)
MCV: 83.2 fL (ref 80.0–100.0)
Platelets: 319 10*3/uL (ref 150–400)
RBC: 3.34 MIL/uL — ABNORMAL LOW (ref 4.22–5.81)
RDW: 13.4 % (ref 11.5–15.5)
WBC: 17.5 10*3/uL — ABNORMAL HIGH (ref 4.0–10.5)
nRBC: 0 % (ref 0.0–0.2)

## 2020-05-16 LAB — BASIC METABOLIC PANEL
Anion gap: 12 (ref 5–15)
BUN: 20 mg/dL (ref 8–23)
CO2: 21 mmol/L — ABNORMAL LOW (ref 22–32)
Calcium: 9.1 mg/dL (ref 8.9–10.3)
Chloride: 98 mmol/L (ref 98–111)
Creatinine, Ser: 1.81 mg/dL — ABNORMAL HIGH (ref 0.61–1.24)
GFR calc Af Amer: 44 mL/min — ABNORMAL LOW (ref >60)
GFR calc non Af Amer: 38 mL/min — ABNORMAL LOW (ref >60)
Glucose, Bld: 179 mg/dL — ABNORMAL HIGH (ref 70–99)
Potassium: 4.7 mmol/L (ref 3.5–5.1)
Sodium: 131 mmol/L — ABNORMAL LOW (ref 135–145)

## 2020-05-16 LAB — GLUCOSE, CAPILLARY: Glucose-Capillary: 197 mg/dL — ABNORMAL HIGH (ref 70–99)

## 2020-05-16 LAB — HIV ANTIBODY (ROUTINE TESTING W REFLEX): HIV Screen 4th Generation wRfx: NONREACTIVE

## 2020-05-16 LAB — APTT: aPTT: 41 seconds — ABNORMAL HIGH (ref 24–36)

## 2020-05-16 LAB — ABO/RH: ABO/RH(D): O POS

## 2020-05-16 MED ORDER — INSULIN ASPART 100 UNIT/ML ~~LOC~~ SOLN
0.0000 [IU] | Freq: Three times a day (TID) | SUBCUTANEOUS | Status: DC
  Administered 2020-05-17: 1 [IU] via SUBCUTANEOUS
  Administered 2020-05-18 – 2020-05-19 (×3): 2 [IU] via SUBCUTANEOUS
  Administered 2020-05-20 – 2020-05-21 (×4): 1 [IU] via SUBCUTANEOUS
  Administered 2020-05-22: 2 [IU] via SUBCUTANEOUS
  Administered 2020-05-22 – 2020-05-23 (×3): 1 [IU] via SUBCUTANEOUS

## 2020-05-16 MED ORDER — SODIUM CHLORIDE 0.9 % IV SOLN
2.0000 g | Freq: Two times a day (BID) | INTRAVENOUS | Status: DC
  Administered 2020-05-16 – 2020-05-17 (×2): 2 g via INTRAVENOUS
  Filled 2020-05-16 (×2): qty 2

## 2020-05-16 MED ORDER — VANCOMYCIN HCL 750 MG/150ML IV SOLN: 750.0000 mg | Freq: Two times a day (BID) | INTRAVENOUS | Status: DC

## 2020-05-16 MED ORDER — VANCOMYCIN HCL IN DEXTROSE 1-5 GM/200ML-% IV SOLN: 1000.0000 mg | Freq: Once | INTRAVENOUS | Status: DC

## 2020-05-16 MED ORDER — BENZTROPINE MESYLATE 1 MG PO TABS
1.0000 mg | ORAL_TABLET | Freq: Every day | ORAL | Status: DC
  Administered 2020-05-16 – 2020-05-22 (×7): 1 mg via ORAL
  Filled 2020-05-16 (×11): qty 1

## 2020-05-16 MED ORDER — LISINOPRIL 10 MG PO TABS
10.0000 mg | ORAL_TABLET | Freq: Every day | ORAL | Status: DC
  Administered 2020-05-16: 10 mg via ORAL
  Filled 2020-05-16: qty 1

## 2020-05-16 MED ORDER — VANCOMYCIN HCL 2000 MG/400ML IV SOLN
2000.0000 mg | Freq: Once | INTRAVENOUS | Status: DC
  Administered 2020-05-16: 2000 mg via INTRAVENOUS
  Filled 2020-05-16: qty 400

## 2020-05-16 MED ORDER — ACETAMINOPHEN 500 MG PO TABS: 1000.0000 mg | ORAL_TABLET | Freq: Four times a day (QID) | ORAL | Status: DC | PRN

## 2020-05-16 MED ORDER — AMLODIPINE BESYLATE 10 MG PO TABS
10.0000 mg | ORAL_TABLET | Freq: Every day | ORAL | Status: DC
  Administered 2020-05-16: 10 mg via ORAL
  Filled 2020-05-16: qty 2

## 2020-05-16 MED ORDER — SODIUM CHLORIDE 0.9 % IV BOLUS (SEPSIS)
1000.0000 mL | Freq: Once | INTRAVENOUS | Status: AC
  Administered 2020-05-16: 1000 mL via INTRAVENOUS

## 2020-05-16 MED ORDER — ENOXAPARIN SODIUM 40 MG/0.4ML ~~LOC~~ SOLN
40.0000 mg | SUBCUTANEOUS | Status: DC
  Administered 2020-05-16: 40 mg via SUBCUTANEOUS
  Filled 2020-05-16: qty 0.4

## 2020-05-16 MED ORDER — AMLODIPINE BESYLATE 5 MG PO TABS: 5.0000 mg | ORAL_TABLET | Freq: Every day | ORAL | Status: DC

## 2020-05-16 MED ORDER — CHLORHEXIDINE GLUCONATE CLOTH 2 % EX PADS
6.0000 | MEDICATED_PAD | Freq: Every day | CUTANEOUS | Status: DC
  Administered 2020-05-17 – 2020-05-23 (×6): 6 via TOPICAL

## 2020-05-16 MED ORDER — VANCOMYCIN HCL 750 MG/150ML IV SOLN
750.0000 mg | Freq: Two times a day (BID) | INTRAVENOUS | Status: DC
  Administered 2020-05-16 – 2020-05-17 (×2): 750 mg via INTRAVENOUS
  Filled 2020-05-16 (×3): qty 150

## 2020-05-16 MED ORDER — SODIUM CHLORIDE 0.9 % IV SOLN
2.0000 g | Freq: Once | INTRAVENOUS | Status: AC
  Administered 2020-05-16: 2 g via INTRAVENOUS
  Filled 2020-05-16: qty 2

## 2020-05-16 MED ORDER — METRONIDAZOLE IN NACL 5-0.79 MG/ML-% IV SOLN
500.0000 mg | Freq: Three times a day (TID) | INTRAVENOUS | Status: AC
  Administered 2020-05-16 – 2020-05-20 (×12): 500 mg via INTRAVENOUS
  Filled 2020-05-16 (×14): qty 100

## 2020-05-16 MED ORDER — INSULIN ASPART 100 UNIT/ML ~~LOC~~ SOLN: 0.0000 [IU] | Freq: Every day | SUBCUTANEOUS | Status: DC

## 2020-05-16 MED ORDER — ATORVASTATIN CALCIUM 10 MG PO TABS
20.0000 mg | ORAL_TABLET | Freq: Every day | ORAL | Status: DC
  Administered 2020-05-16 – 2020-05-23 (×8): 20 mg via ORAL
  Filled 2020-05-16 (×8): qty 2

## 2020-05-16 MED ORDER — ASPIRIN 81 MG PO CHEW
81.0000 mg | CHEWABLE_TABLET | Freq: Every day | ORAL | Status: DC
  Administered 2020-05-16 – 2020-05-23 (×8): 81 mg via ORAL
  Filled 2020-05-16 (×8): qty 1

## 2020-05-16 MED ORDER — RIVAROXABAN 2.5 MG PO TABS
2.5000 mg | ORAL_TABLET | Freq: Two times a day (BID) | ORAL | Status: DC
  Administered 2020-05-16 – 2020-05-18 (×3): 2.5 mg via ORAL
  Filled 2020-05-16 (×5): qty 1

## 2020-05-16 NOTE — Consult Note (Addendum)
CARDIOLOGY CONSULT NOTE  Patient ID: Ricky Hanson MRN: [id number] DOB/AGE: [birth year] 67 y.o.  Admit date: 05/15/2020 Referring Physician  Jamelle Rushing Primary Physician:  Hoy Register, MD Reason for Consultation  PAD and wet gangrene  Patient ID: Ricky Hanson, male    DOB: 1951/03/11, 69 y.o.   MRN: [id number]  Chief Complaint  Patient presents with  . Foot Infection   HPI:    Ricky Hanson  is a 69 y.o. African-American male with history of hypertension, hyperlipidemia, diabetes mellitus, tobacco use disorder admitted via the emergency room after he was instructed by outpatient podiatry for worsening ulceration in his right second toe and foul-smelling discharge.  Patient has also been having pain in his foot for almost 2 weeks.  History is not very forthcoming but states that he started noticing bluish discoloration about 2 weeks ago.  He denies chest pain or shortness of breath.  His activity is limited, states that he lives in [place] with his cousin.  Smokes about 8 to 9 cigarettes a day.  Denies illicit drug use.  Past Medical History:  Diagnosis Date  . Foley catheter in place    History reviewed. No pertinent surgical history. Social History   Socioeconomic History  . Marital status: Single    Spouse name: Not on file  . Number of children: Not on file  . Years of education: Not on file  . Highest education level: Not on file  Occupational History  . Not on file  Tobacco Use  . Smoking status: Current Every Day Smoker    Packs/day: 0.50    Types: Cigarettes  . Smokeless tobacco: Never Used  Substance and Sexual Activity  . Alcohol use: Never  . Drug use: Never  . Sexual activity: Not on file  Other Topics Concern  . Not on file  Social History Narrative  . Not on file   Social Determinants of Health   Financial Resource Strain:   . Difficulty of Paying Living Expenses:   Food Insecurity:   . Worried About Programme researcher, broadcasting/film/video in the Last  Year:   . Barista in the Last Year:   Transportation Needs:   . Freight forwarder (Medical):   Marland Kitchen Lack of Transportation (Non-Medical):   Physical Activity:   . Days of Exercise per Week:   . Minutes of Exercise per Session:   Stress:   . Feeling of Stress :   Social Connections:   . Frequency of Communication with Friends and Family:   . Frequency of Social Gatherings with Friends and Family:   . Attends Religious Services:   . Active Member of Clubs or Organizations:   . Attends Banker Meetings:   Marland Kitchen Marital Status:   Intimate Partner Violence:   . Fear of Current or Ex-Partner:   . Emotionally Abused:   Marland Kitchen Physically Abused:   . Sexually Abused:    ROS  Review of Systems  Constitutional: Positive for malaise/fatigue.  Cardiovascular: Positive for leg swelling. Negative for chest pain and dyspnea on exertion.  Gastrointestinal: Negative for melena.  Genitourinary: Positive for bladder incontinence.  All other systems reviewed and are negative.  Objective   Vitals with BMI 05/16/2020 05/16/2020 05/16/2020  Height - - -  Weight - - -  BMI - - -  Systolic 116 151 993  Diastolic 90 60 67  Pulse 75 73 79    Blood pressure 116/90, pulse 75, temperature 98.9 F (37.2 C), temperature source  Oral, resp. rate 16, height 6\' 1"  (1.854 m), weight 90.7 kg, SpO2 100 %.    Physical Exam Constitutional:      General: He is not in acute distress.    Appearance: He is ill-appearing.  HENT:     Mouth/Throat:     Mouth: Mucous membranes are moist.  Cardiovascular:     Rate and Rhythm: Normal rate and regular rhythm.     Pulses: Intact distal pulses.          Carotid pulses are on the right side with bruit and on the left side with bruit.      Radial pulses are 2+ on the right side and 2+ on the left side.       Femoral pulses are 1+ on the right side and 1+ on the left side.      Popliteal pulses are 0 on the right side and 0 on the left side.        Dorsalis pedis pulses are 0 on the left side.       Posterior tibial pulses are 0 on the left side.     Heart sounds: Normal heart sounds. No murmur heard.  No gallop.      Comments: Bilateral legs appear chronically ischemic with thin shiny skin. Right foot ulcer noted as media and dressing not removed. Left leg skin intact. Right leg below knee 2 + pitting edema. Left leg is dry and wrinkled. Mild diffuse tenderness present Pulmonary:     Effort: Pulmonary effort is normal.     Breath sounds: Normal breath sounds.  Abdominal:     General: Bowel sounds are normal.     Palpations: Abdomen is soft.  Neurological:     Mental Status: He is alert.    Laboratory examination:    Recent Labs    02/02/20 1427 05/15/20 1559 05/16/20 0844  NA 140 130* 131*  K 4.6 4.5 4.7  CL 101 99 98  CO2 23 21* 21*  GLUCOSE 137* 159* 179*  BUN 16 15 20   CREATININE 1.32* 1.57* 1.81*  CALCIUM 9.3 9.0 9.1  GFRNONAA 56* 45* 38*  GFRAA 65 52* 44*   estimated creatinine clearance is 45.4 mL/min (A) (by C-G formula based on SCr of 1.81 mg/dL (H)).  CMP Latest Ref Rng & Units 05/16/2020 05/15/2020 02/02/2020  Glucose 70 - 99 mg/dL 179(H) 159(H) 137(H)  BUN 8 - 23 mg/dL 20 15 16   Creatinine 0.61 - 1.24 mg/dL 1.81(H) 1.57(H) 1.32(H)  Sodium 135 - 145 mmol/L 131(L) 130(L) 140  Potassium 3.5 - 5.1 mmol/L 4.7 4.5 4.6  Chloride 98 - 111 mmol/L 98 99 101  CO2 22 - 32 mmol/L 21(L) 21(L) 23  Calcium 8.9 - 10.3 mg/dL 9.1 9.0 9.3  Total Protein 6.5 - 8.1 g/dL - 7.6 7.1  Total Bilirubin 0.3 - 1.2 mg/dL - 0.8 0.5  Alkaline Phos 38 - 126 U/L - 140(H) 102  AST 15 - 41 U/L - 24 13  ALT 0 - 44 U/L - 40 9   CBC Latest Ref Rng & Units 05/16/2020 05/15/2020 02/02/2020  WBC 4.0 - 10.5 K/uL 17.5(H) 20.6(H) 7.1  Hemoglobin 13.0 - 17.0 g/dL 9.1(L) 10.3(L) 12.1(L)  Hematocrit 39 - 52 % 27.8(L) 31.8(L) 37.3(L)  Platelets 150 - 400 K/uL 319 323 221   Lipid Panel     Component Value Date/Time   CHOL 182 02/02/2020 1427    TRIG 67 02/02/2020 1427   HDL 64 02/02/2020 1427  CHOLHDL 2.8 02/02/2020 1427   LDLCALC 105 (H) 02/02/2020 1427   HEMOGLOBIN A1C Lab Results  Component Value Date   HGBA1C 5.5 02/02/2020   TSH No results for input(s): TSH in the last 8760 hours. BNP (last 3 results) No results for input(s): BNP in the last 8760 hours.  Medications and allergies  No Known Allergies    Current Outpatient Medications  Medication Instructions  . acetaminophen (TYLENOL) 1,000 mg, Oral, Every 6 hours PRN  . benztropine (COGENTIN) 1 mg, Oral, Daily at bedtime  . haloperidol decanoate (HALDOL DECANOATE) 100 mg, Intramuscular, Every 28 days, Inject 2 mLs every 4 weeks    Scheduled Meds: . amLODipine  10 mg Oral Daily  . atorvastatin  20 mg Oral Daily  . benztropine  1 mg Oral QHS  . enoxaparin (LOVENOX) injection  40 mg Subcutaneous Q24H  . sodium chloride flush  3 mL Intravenous Once   Continuous Infusions: . ceFEPime (MAXIPIME) IV    . metronidazole Stopped (05/16/20 1449)  . vancomycin     PRN Meds:.acetaminophen  No intake/output data recorded. Total I/O In: 7148.4 [IV Piggyback:7148.4] Out: -     Radiology:   Imaging: DG Chest 1 View  Result Date: 05/16/2020 CLINICAL DATA:  Shortness of breath. EXAM: CHEST  1 VIEW COMPARISON:  None. FINDINGS: There is no evidence of acute infiltrate, pleural effusion or pneumothorax. The heart size and mediastinal contours are within normal limits. There is mild calcification of the aortic arch. The visualized skeletal structures are unremarkable. IMPRESSION: No active disease. Electronically Signed   By: Aram Candela M.D.   On: 05/16/2020 16:36   DG Foot Complete Right  Result Date: 05/16/2020 CLINICAL DATA:  Foot pain EXAM: RIGHT FOOT COMPLETE - 3+ VIEW COMPARISON:  04/16/2020 FINDINGS: Postsurgical changes are noted with amputation of the first digit. Soft tissue swelling and subcutaneous air is noted in the second digit consistent with  localized infection. There has been interval bony destruction at the distal aspect of the second proximal phalanx. Persistent resorption at the phalangeal tuft of the second digit is noted. These changes are consistent with osteomyelitis. IMPRESSION: Progressive soft tissue swelling, subcutaneous air and bony destruction in the second digit as described. These changes are consistent with cellulitis and underlying osteomyelitis. Electronically Signed   By: Alcide Clever M.D.   On: 05/16/2020 08:44    Cardiac Studies:   Arterial duplex 04/20/2020 Summary:    Right: 30-49% stenosis noted in the superficial femoral artery. 50-74%  stenosis noted in the anterior tibial artery followed by suspected  occlusion vs. collateral flow distally. Total occlusion noted in the  posterior tibial artery. Total occlusion noted  in the peroneal artery.   Left: 50-74% stenosis noted in the superficial femoral artery. Total  occlusion noted in the anterior tibial artery with reconstitution of flow  in the distal anterior tibial artery. Total occlusion noted in the  posterior tibial artery. Total occlusion  noted in the peroneal artery.  Arterial wall calcification may preclude accurate ankle pressures and  ABIs.    Summary:  Right: Resting right ankle-brachial index indicates moderate right lower  extremity arterial disease.   Left: Resting left ankle-brachial index indicates mild left lower  extremity arterial disease. The left toe-brachial index is abnormal.  TPI bilateral is mildly abnormal and absent right 1st toe.  EKG:  EKG 05/16/2020: Normal sinus rhythm at rate of 84 bpm, normal axis, poor R wave progression, probably normal variant.  No evidence of ischemia.  Assessment   1.  Osteomyelitis of the right foot 2.  Peripheral arterial disease involving the small vessels with occluded small vessels below the knee, ABI moderately abnormal on the right and mildly abnormal on the left but medial  calcinosis noted and could be falsely elevated. 3.  Hypertension 4.  Hyperlipidemia 5.?  Diabetes mellitus/hyperglycemia 6.  Acute on chronic kidney disease, presently stage IIIb, previously stage IIIa. 7.  Chronic indwelling Foley catheter, abnormal UA 8.  Bilateral carotid artery bruit  Recommendations:   Patient has significant inflammation, foul-smelling discharge and wet gangrene of his right foot and includes osteomyelitis as well.  The tissue is not salvageable, the question is whether he needs transmetatarsal amputation or below-knee amputation.  With patient's elevated WBC, abnormal UA and acute renal failure, would probably recommend stabilization medically, and then proceed with amputation, we could certainly do peripheral arteriogram to see if we can limit tissue loss.  However in view of his acute metabolic issues and sepsis, will avoid instrumentation for now.  I will continue to follow along.  For now I would recommend starting ASA 81 mg daily,  Xarelto 2.5 mg p.o. twice daily in view of critical limb ischemia and gangrene, would reduce amlodipine to 1/2 tablet daily and have permissive hypertension and also to improve perfusion, I have discussed with Dr. Lajoyce Corners and informed him regarding evaluation for possible amputation as well.  He will be seeing the patient in the morning.  With regard to carotid artery disease, he will need Dopplers at some point.  At this time would like to address his acute issues and refrain from any cardiac testing.  There is no clinical evidence of heart failure, EKG is normal, denies symptoms suggestive of angina although he has high likelihood of underlying multivessel disease.  Check A1c and TSH. Venous duplex to exclude DVT.  Yates Decamp, MD, [facility] 05/16/2020, 6:28 PM [facility] [place] Pager: [phone] Office: [phone]

## 2020-05-16 NOTE — Progress Notes (Signed)
Notified provider of need to order lactic acid. ° °

## 2020-05-16 NOTE — Progress Notes (Signed)
FPTS Interim Progress Note  S:Patient states "'I'm fine"   O: BP 125/74 (BP Location: Right Arm)   Pulse 81   Temp 98.8 F (37.1 C) (Oral)   Resp 16   Ht 6\' 1"  (1.854 m)   Wt 90.7 kg   SpO2 100%   BMI 26.39 kg/m    GEN: resting comfortably in bed, in no acute distress  CVS: RRR  RESP: clear to ascultation bilaterally, no increased work of breathing  EXT: Right foot dressing, clean dry and intact   A/P: Right foot Gangrene  PM reassessment:  Pt stable. He refused his PM labs as he didn't want to get stuck again.  See full H&P for A/P.    , DO 05/16/2020, 11:05 PM PGY-1, [facility] Service pager [phone]

## 2020-05-16 NOTE — Telephone Encounter (Signed)
PA Graciella Freer from [facility] has called and really needs to consult with Dr.Wagoner on this pt she requested stat  Contact [facility] [phone]

## 2020-05-16 NOTE — Consult Note (Addendum)
Reason for Consult: gangrene/infection Referring Physician: Graciella Freer, PA-C  Lenward Able is an 69 y.o. male.  HPI:  69 year old male with symptomatic emergency room yesterday from clinic for concerns of infection, gangrene to his right second toe.  Is a history of previous hallux amputation.  This apparently is been ongoing for some time his previous had arterial studies which were abnormal.  He had an appointment with cardiology scheduled however he apparently did not show up.  Yesterday for worsening smell, swelling and pain to his foot.  White blood cell count significantly elevated and x-rays concerning for osteomyelitis.  Past Medical History:  Diagnosis Date  . Foley catheter in place     History reviewed. No pertinent surgical history.  No family history on file.  Social History:  reports that he has been smoking cigarettes. He has been smoking about 0.50 packs per day. He has never used smokeless tobacco. He reports that he does not drink alcohol and does not use drugs.  Allergies: No Known Allergies  Medications: I have reviewed the patient's current medications.  Results for orders placed or performed during the hospital encounter of 05/15/20 (from the past 48 hour(s))  Lactic acid, plasma     Status: None   Collection Time: 05/15/20  3:59 PM  Result Value Ref Range   Lactic Acid, Venous 1.6 0.5 - 1.9 mmol/L    Comment: Performed at [facility], [address], [place], [place] [address]  Comprehensive metabolic panel     Status: Abnormal   Collection Time: 05/15/20  3:59 PM  Result Value Ref Range   Sodium 130 (L) 135 - 145 mmol/L   Potassium 4.5 3.5 - 5.1 mmol/L   Chloride 99 98 - 111 mmol/L   CO2 21 (L) 22 - 32 mmol/L   Glucose, Bld 159 (H) 70 - 99 mg/dL    Comment: Glucose reference range applies only to samples taken after fasting for at least 8 hours.   BUN 15 8 - 23 mg/dL   Creatinine, Ser 2.44 (H) 0.61 - 1.24 mg/dL   Calcium 9.0 8.9 - 01.0  mg/dL   Total Protein 7.6 6.5 - 8.1 g/dL   Albumin 3.0 (L) 3.5 - 5.0 g/dL   AST 24 15 - 41 U/L   ALT 40 0 - 44 U/L   Alkaline Phosphatase 140 (H) 38 - 126 U/L   Total Bilirubin 0.8 0.3 - 1.2 mg/dL   GFR calc non Af Amer 45 (L) >60 mL/min   GFR calc Af Amer 52 (L) >60 mL/min   Anion gap 10 5 - 15    Comment: Performed at [facility], [address], [place], [place] [address]  CBC with Differential     Status: Abnormal   Collection Time: 05/15/20  3:59 PM  Result Value Ref Range   WBC 20.6 (H) 4.0 - 10.5 K/uL   RBC 3.68 (L) 4.22 - 5.81 MIL/uL   Hemoglobin 10.3 (L) 13.0 - 17.0 g/dL   HCT 66.4 (L) 39 - 52 %   MCV 86.4 80.0 - 100.0 fL   MCH 28.0 26.0 - 34.0 pg   MCHC 32.4 30.0 - 36.0 g/dL   RDW 40.3 47.4 - 25.9 %   Platelets 323 150 - 400 K/uL   nRBC 0.0 0.0 - 0.2 %   Neutrophils Relative % 83 %   Neutro Abs 17.1 (H) 1.7 - 7.7 K/uL   Lymphocytes Relative 9 %   Lymphs Abs 1.9 0.7 -  4.0 K/uL   Monocytes Relative 7 %   Monocytes Absolute 1.3 (H) 0 - 1 K/uL   Eosinophils Relative 0 %   Eosinophils Absolute 0.1 0 - 0 K/uL   Basophils Relative 0 %   Basophils Absolute 0.1 0 - 0 K/uL   Immature Granulocytes 1 %   Abs Immature Granulocytes 0.11 (H) 0.00 - 0.07 K/uL    Comment: Performed at [facility], [address] [place], [place] [address]  Lactic acid, plasma     Status: None   Collection Time: 05/16/20  8:44 AM  Result Value Ref Range   Lactic Acid, Venous 1.7 0.5 - 1.9 mmol/L    Comment: Performed at [facility], [address], [place], [place] [address]  SARS Coronavirus 2 by RT PCR (hospital order, performed in [facility] hospital lab) Nasopharyngeal Nasopharyngeal Swab     Status: None   Collection Time: 05/16/20  8:44 AM   Specimen: Nasopharyngeal Swab  Result Value Ref Range   SARS Coronavirus 2 NEGATIVE NEGATIVE    Comment: (NOTE) SARS-CoV-2 target nucleic acids are NOT DETECTED.  The SARS-CoV-2 RNA is generally detectable in upper and  lower respiratory specimens during the acute phase of infection. The lowest concentration of SARS-CoV-2 viral copies this assay can detect is 250 copies / mL. A negative result does not preclude SARS-CoV-2 infection and should not be used as the sole basis for treatment or other patient management decisions.  A negative result may occur with improper specimen collection / handling, submission of specimen other than nasopharyngeal swab, presence of viral mutation(s) within the areas targeted by this assay, and inadequate number of viral copies (<250 copies / mL). A negative result must be combined with clinical observations, patient history, and epidemiological information.  Fact Sheet for Patients:   BoilerBrush.com.cy  Fact Sheet for Healthcare Providers: https://pope.com/  This test is not yet approved or  cleared by the [place] FDA and has been authorized for detection and/or diagnosis of SARS-CoV-2 by FDA under an Emergency Use Authorization (EUA).  This EUA will remain in effect (meaning this test can be used) for the duration of the COVID-19 declaration under Section 564(b)(1) of the Act, 21 U.S.C. section 360bbb-3(b)(1), unless the authorization is terminated or revoked sooner.  Performed at [facility], [address], [place], [place] [address]   Basic metabolic panel     Status: Abnormal   Collection Time: 05/16/20  8:44 AM  Result Value Ref Range   Sodium 131 (L) 135 - 145 mmol/L   Potassium 4.7 3.5 - 5.1 mmol/L   Chloride 98 98 - 111 mmol/L   CO2 21 (L) 22 - 32 mmol/L   Glucose, Bld 179 (H) 70 - 99 mg/dL    Comment: Glucose reference range applies only to samples taken after fasting for at least 8 hours.   BUN 20 8 - 23 mg/dL   Creatinine, Ser 4.25 (H) 0.61 - 1.24 mg/dL   Calcium 9.1 8.9 - 95.6 mg/dL   GFR calc non Af Amer 38 (L) >60 mL/min   GFR calc Af Amer 44 (L) >60 mL/min   Anion gap 12 5 - 15     Comment: Performed at [facility], [address], [place], [place] [address]  APTT     Status: Abnormal   Collection Time: 05/16/20  8:44 AM  Result Value Ref Range   aPTT 41 (H) 24 - 36 seconds  Comment:        IF BASELINE aPTT IS ELEVATED, SUGGEST PATIENT RISK ASSESSMENT BE USED TO DETERMINE APPROPRIATE ANTICOAGULANT THERAPY. Performed at [facility], [place] [address], [place], [place] [address]   Protime-INR     Status: None   Collection Time: 05/16/20  8:44 AM  Result Value Ref Range   Prothrombin Time 14.7 11.4 - 15.2 seconds   INR 1.2 0.8 - 1.2    Comment: (NOTE) INR goal varies based on device and disease states. Performed at [facility], [place] [address], [place], [place] [address]   Urinalysis, Routine w reflex microscopic     Status: Abnormal   Collection Time: 05/16/20  9:45 AM  Result Value Ref Range   Color, Urine YELLOW YELLOW   APPearance CLOUDY (A) CLEAR   Specific Gravity, Urine 1.010 1.005 - 1.030   pH 5.0 5.0 - 8.0   Glucose, UA NEGATIVE NEGATIVE mg/dL   Hgb urine dipstick SMALL (A) NEGATIVE   Bilirubin Urine NEGATIVE NEGATIVE   Ketones, ur NEGATIVE NEGATIVE mg/dL   Protein, ur 30 (A) NEGATIVE mg/dL   Nitrite NEGATIVE NEGATIVE   Leukocytes,Ua LARGE (A) NEGATIVE   RBC / HPF 11-20 0 - 5 RBC/hpf   WBC, UA >50 (H) 0 - 5 WBC/hpf   Bacteria, UA MANY (A) NONE SEEN   Squamous Epithelial / LPF 0-5 0 - 5   WBC Clumps PRESENT    Mucus PRESENT    Hyaline Casts, UA PRESENT    Granular Casts, UA PRESENT     Comment: Performed at [facility], [place] [address], [place], [place] [address]    DG Foot Complete Right  Result Date: 05/16/2020 CLINICAL DATA:  Foot pain EXAM: RIGHT FOOT COMPLETE - 3+ VIEW COMPARISON:  04/16/2020 FINDINGS: Postsurgical changes are noted with amputation of the first digit. Soft tissue swelling and subcutaneous air is noted in the second digit consistent with localized infection. There has been interval bony  destruction at the distal aspect of the second proximal phalanx. Persistent resorption at the phalangeal tuft of the second digit is noted. These changes are consistent with osteomyelitis. IMPRESSION: Progressive soft tissue swelling, subcutaneous air and bony destruction in the second digit as described. These changes are consistent with cellulitis and underlying osteomyelitis. Electronically Signed   By: Inez Catalina M.D.   On: 05/16/2020 08:44    Review of Systems Blood pressure (!) 157/78, pulse 90, temperature 98.9 F (37.2 C), temperature source Oral, resp. rate 20, height 6\' 1"  (1.854 m), weight 90.7 kg, SpO2 99 %. Physical Exam  Dermatological: Significant gangrenous changes present of the second toe with malodor.  Also starting gangrenous changes of the fourth toe.  There is hyperpigmented changes on the forefoot.  Mild purulence coming from the second toe.  Superficial wound breakdown the dorsal aspect the foot.  There is no fluctuation crepitation.       Vascular: Nonpalpable pulses.  There is no pain with calf compression, swelling, warmth, erythema.   Neruologic: Decrease in station  Musculoskeletal: No tenderness.  Assessment/Plan: Gangrene, PAD right foot with cellulitis/osteomyelitis   I had a discussion with the patient today.  I also discussed with him and his cousin yesterday office need for amputation.  At minimum will need toe amputation but most likely TMA vs BKA.  I would like for him to be evaluated by vascular surgery prior to any definitive surgical plans.  Continue IV Antibiotics for now we will continue to  monitor closely. NPO for right now until surgical plans decided and vascular has not seen the patient yet.  Consider MRI to proximal infection, abscess.  X-ray is concerning for subcutaneous air of the second toe however there is extensive gangrenous change for the toe.  Vivi Barrack 05/16/2020, 12:07 PM   O: [phone] C: [phone]

## 2020-05-16 NOTE — Plan of Care (Signed)

## 2020-05-16 NOTE — ED Provider Notes (Addendum)
[facility] EMERGENCY DEPARTMENT Provider Note   CSN: [id number] Arrival date & time: 05/15/20  1505     History Chief Complaint  Patient presents with   Foot Infection    Ricky Hanson is a 69 y.o. male with PMH/o diabetes who presents today for evaluation of worsening pain, wound to second toe of right lower extremity.  He saw podiatry yesterday for evaluation of malodorous, gangrene to his right second toe.  They have been monitoring skin breakdown/wound to the right second toe.  He saw them yesterday for worsening concerns and was sent to the ED for further evaluation.  He states that it has gotten worse specifically in the last few weeks.  He had been referred to cardiology but did not show to the appointment.  He states he has not had any fevers, chills or vomiting.  He denies any chest pain, difficulty breathing.  He does have history of diabetes.  Patient is poor historian.  The history is provided by the patient.       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 No pertinent surgical history.     No family history on file.  Social History   Tobacco Use   Smoking status: Current Every Day Smoker    Packs/day: 0.50    Types: Cigarettes   Smokeless tobacco: Never Used  Substance Use Topics   Alcohol use: Never   Drug use: Never    Home Medications Prior to Admission medications   Medication Sig Start Date End Date Taking? Authorizing Provider  acetaminophen (TYLENOL) 500 MG tablet Take 1,000 mg by mouth every 6 (six) hours as needed for mild pain.   Yes [provider]  benztropine (COGENTIN) 1 MG  tablet Take 1 mg by mouth at bedtime.   Yes [provider]  haloperidol decanoate (HALDOL DECANOATE) 100 MG/ML injection Inject 100 mg into the muscle every 28 (twenty-eight) days. Inject 2 mLs every 4 weeks   Yes [provider]    Allergies    Patient has no known allergies.  Review of Systems   Review of Systems  Constitutional: Negative for fever.  Respiratory: Negative for shortness of breath.   Cardiovascular: Negative for chest pain.  Gastrointestinal: Negative for abdominal pain, nausea and vomiting.  Skin: Positive for color change and wound.  All other systems reviewed and are negative.   Physical Exam Updated Vital Signs BP 138/80 (BP Location: Right Arm)    Pulse 89    Temp 98.9 F (37.2 C) (Oral)    Resp 16    Ht 6\' 1"  (1.854 m)    Wt 90.7 kg    SpO2 98%    BMI 26.39 kg/m   Physical Exam Vitals and nursing note reviewed.  Constitutional:      Appearance: Normal appearance. He is well-developed.  HENT:     Head: Normocephalic and atraumatic.  Eyes:     General: Lids are normal.     Conjunctiva/sclera: Conjunctivae normal.     Pupils: Pupils are equal, round, and reactive to light.  Cardiovascular:     Rate and Rhythm: Normal rate and regular  rhythm.     Pulses:          Radial pulses are 2+ on the right side and 2+ on the left side.       Dorsalis pedis pulses are detected w/ Doppler on the right side and 1+ on the left side.     Heart sounds: Normal heart sounds. No murmur heard.  No friction rub. No gallop.      Comments: Difficulty obtaining palpable DP pulse on right lower extremity.  Faint DP pulse noted with Doppler. Pulmonary:     Effort: Pulmonary effort is normal.     Breath sounds: Normal breath sounds.     Comments: Lungs clear to auscultation bilaterally.  Symmetric chest rise.  No wheezing, rales, rhonchi. Abdominal:     Palpations: Abdomen is soft. Abdomen is not rigid.     Tenderness: There is no abdominal tenderness. There  is no guarding.     Comments: Abdomen is soft, non-distended, non-tender. No rigidity, No guarding. No peritoneal signs.  Musculoskeletal:        General: Normal range of motion.     Cervical back: Full passive range of motion without pain.  Skin:    General: Skin is warm and dry.     Capillary Refill: Capillary refill takes less than 2 seconds.  Neurological:     Mental Status: He is alert and oriented to person, place, and time.  Psychiatric:        Speech: Speech normal.          ED Results / Procedures / Treatments   Labs (all labs ordered are listed, but only abnormal results are displayed) Labs Reviewed  COMPREHENSIVE METABOLIC PANEL - Abnormal; Notable for the following components:      Result Value   Sodium 130 (*)    CO2 21 (*)    Glucose, Bld 159 (*)    Creatinine, Ser 1.57 (*)    Albumin 3.0 (*)    Alkaline Phosphatase 140 (*)    GFR calc non Af Amer 45 (*)    GFR calc Af Amer 52 (*)    All other components within normal limits  CBC WITH DIFFERENTIAL/PLATELET - Abnormal; Notable for the following components:   WBC 20.6 (*)    RBC 3.68 (*)    Hemoglobin 10.3 (*)    HCT 31.8 (*)    Neutro Abs 17.1 (*)    Monocytes Absolute 1.3 (*)    Abs Immature Granulocytes 0.11 (*)    All other components within normal limits  BASIC METABOLIC PANEL - Abnormal; Notable for the following components:   Sodium 131 (*)    CO2 21 (*)    Glucose, Bld 179 (*)    Creatinine, Ser 1.81 (*)    GFR calc non Af Amer 38 (*)    GFR calc Af Amer 44 (*)    All other components within normal limits  APTT - Abnormal; Notable for the following components:   aPTT 41 (*)    All other components within normal limits  URINALYSIS, ROUTINE W REFLEX MICROSCOPIC - Abnormal; Notable for the following components:   APPearance CLOUDY (*)    Hgb urine dipstick SMALL (*)    Protein, ur 30 (*)    Leukocytes,Ua LARGE (*)    WBC, UA >50 (*)    Bacteria, UA MANY (*)    All other components within  normal limits  SARS CORONAVIRUS 2 BY RT PCR (HOSPITAL ORDER, PERFORMED IN [facility]  LAB)  CULTURE, BLOOD (ROUTINE X 2)  CULTURE, BLOOD (ROUTINE X 2)  URINE CULTURE  LACTIC ACID, PLASMA  LACTIC ACID, PLASMA  PROTIME-INR  LACTIC ACID, PLASMA  LACTIC ACID, PLASMA  HIV ANTIBODY (ROUTINE TESTING W REFLEX)  CBC    EKG EKG Interpretation  Date/Time:  Tuesday May 16 2020 08:32:29 EDT Ventricular Rate:  84 PR Interval:    QRS Duration: 83 QT Interval:  368 QTC Calculation: 435 R Axis:   36 Text Interpretation: Sinus rhythm Anteroseptal infarct, old No STEMI Confirmed by Alvester Chou [phone]) on 05/16/2020 8:41:24 AM   Radiology DG Foot Complete Right  Result Date: 05/16/2020 CLINICAL DATA:  Foot pain EXAM: RIGHT FOOT COMPLETE - 3+ VIEW COMPARISON:  04/16/2020 FINDINGS: Postsurgical changes are noted with amputation of the first digit. Soft tissue swelling and subcutaneous air is noted in the second digit consistent with localized infection. There has been interval bony destruction at the distal aspect of the second proximal phalanx. Persistent resorption at the phalangeal tuft of the second digit is noted. These changes are consistent with osteomyelitis. IMPRESSION: Progressive soft tissue swelling, subcutaneous air and bony destruction in the second digit as described. These changes are consistent with cellulitis and underlying osteomyelitis. Electronically Signed   By: Alcide Clever M.D.   On: 05/16/2020 08:44    Procedures .Critical Care Performed by: Maxwell Caul, PA-C Authorized by: Maxwell Caul, PA-C   Critical care provider statement:    Critical care time (minutes):  35   Critical care was necessary to treat or prevent imminent or life-threatening deterioration of the following conditions:  Sepsis   Critical care was time spent personally by me on the following activities:  Discussions with consultants, evaluation of patient's response to treatment,  examination of patient, ordering and performing treatments and interventions, ordering and review of laboratory studies, ordering and review of radiographic studies, pulse oximetry, re-evaluation of patient's condition, obtaining history from patient or surrogate and review of old charts   (including critical care time)  Medications Ordered in ED Medications  sodium chloride flush (NS) 0.9 % injection 3 mL (has no administration in time range)  sodium chloride 0.9 % bolus 1,000 mL (0 mLs Intravenous Stopped 05/16/20 1037)    And  sodium chloride 0.9 % bolus 1,000 mL (1,000 mLs Intravenous New Bag/Given 05/16/20 1259)    And  sodium chloride 0.9 % bolus 1,000 mL (0 mLs Intravenous Stopped 05/16/20 1253)  ceFEPIme (MAXIPIME) 2 g in sodium chloride 0.9 % 100 mL IVPB (has no administration in time range)  metroNIDAZOLE (FLAGYL) IVPB 500 mg (500 mg Intravenous New Bag/Given 05/16/20 1300)  acetaminophen (TYLENOL) tablet 1,000 mg (has no administration in time range)  benztropine (COGENTIN) tablet 1 mg (has no administration in time range)  amLODipine (NORVASC) tablet 10 mg (10 mg Oral Given 05/16/20 1301)  atorvastatin (LIPITOR) tablet 20 mg (20 mg Oral Given 05/16/20 1301)  lisinopril (ZESTRIL) tablet 10 mg (10 mg Oral Given 05/16/20 1301)  enoxaparin (LOVENOX) injection 40 mg (has no administration in time range)  ceFEPIme (MAXIPIME) 2 g in sodium chloride 0.9 % 100 mL IVPB (0 g Intravenous Stopped 05/16/20 0947)    ED Course  I have reviewed the triage vital signs and the nursing notes.  Pertinent labs & imaging results that were available during my care of the patient were reviewed by me and considered in my medical decision making (see chart for details).  Clinical Course as of May 17 1315  Tue  May 16, 2020  [id number] 69 year old male with a history of diabetes presenting to ED with gangrenous right second toe.  He has a history of toe amputation on the right side for similar issues.  He was  referred in by his podiatrist for hospital admission for recommended amputation of this toe, which is clearly gangrenous.  On exam the patient appears overall comfortable.  He does have a blackened necrotic second toe.  There is some warmth of the surrounding tissue of the foot.  Labs are notable for WBC 20.6, lactate 1.6, CMP with Cr 1.57.  Plan to initiate sepsis work-up broad-spectrum antibiotics.  He will need admission and likely to amputation.   [MT]    Clinical Course User Index [MT] Trifan, Kermit Balo, MD   MDM Rules/Calculators/A&P                          69 year old male who presents for evaluation of discolored, malodorous right second toe.  He had wounds on the toe and been working with podiatry.  Saw podiatry yesterday and noticed gangrenous appearance.  He was sent to the ED for further evaluation.  Patient does have history of prior right first toe amputation that he states was done in [place].  He does have history of diabetes.  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  On initial arrival, he is afebrile, nontoxic-appearing.  Vitals are stable.  Difficulty obtaining DP pulses palpably but faint DP pulse noted with Doppler.  Concern for osteo-/gangrene.  Labs ordered at triage.  We will plan for x-ray.  Patient started on broad-spectrum antibiotics.  Will likely need admission.  CBC shows leukocytosis of 20.6.  Hemoglobin of 10.3.  Lactic is normal.  CMP shows BUN of 15, creatinine 1.57.  XR shows progressive soft tissue swelling with subcutaneous air and bony destruction consistent with osteomyelitis.   Discussed patient with Earney [place], PA-C (Ortho). He recommends we contact [facility] for management since patient is well known to them.   Discussed patient with Dr. Dareen Piano (Family Med) who accepts patient for admission.   Discussed patient with Dr. Ardelle Anton ([facility]). He  will plan to consult on patient and contact admitting team.  Admitting team notified.   Arterial duplex 04/20/2020 Summary:    Right: 30-49% stenosis noted in the superficial femoral artery. 50-74%  stenosis noted in the anterior tibial artery followed by suspected  occlusion vs. collateral flow distally. Total occlusion noted in the  posterior tibial artery. Total occlusion noted  in the peroneal artery.   Left: 50-74% stenosis noted in the superficial femoral artery. Total  occlusion noted in the anterior tibial artery with reconstitution of flow  in the distal anterior tibial artery. Total occlusion noted in the  posterior tibial artery. Total occlusion  noted in the peroneal artery.   Portions of this note were generated with Scientist, clinical (histocompatibility and immunogenetics). Dictation errors may occur despite best attempts at proofreading.  Final Clinical Impression(s) / ED Diagnoses Final diagnoses:  Osteomyelitis of right foot, unspecified type [facility])    Rx / DC Orders ED Discharge Orders    None       Maxwell Caul, PA-C 05/16/20 1316    Maxwell Caul, PA-C 05/16/20 1347    Terald Sleeper, MD 05/16/20 1407
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
## 2020-05-16 NOTE — Progress Notes (Signed)
Notified bedside nurse of need to draw lactic acid.  

## 2020-05-16 NOTE — ED Notes (Signed)
Pt was given sprite just prior to NPO order placed. Pt aware and will be NPO staring now.

## 2020-05-16 NOTE — Progress Notes (Signed)
Pharmacy Antibiotic Note  Ricky Hanson is a 69 y.o. male admitted on 05/15/2020 with wound infection.  Pharmacy has been consulted for vancomycin and cefepime dosing for R 2nd toe infection. Patient was seen as an outpatient on 6/21 with concern of malodor and gangrene R 2nd toe and was recommended to be admitted to be seen by vascular surgery. X-ray of foot indicative of cellulitis with underlying osteomyelitis. WBC 20.6. Tmax 100.8. Scr 1.81 with baseline Scr unknown.   Plan: Vancomycin 2000mg  x1 followed by vancomycin 750mg  IV q12h Cefepime 2g x1 followed by cefepime 2g IV q12h  Monitor renal function closely and need to change to vancomycin by random level  Follow up renal function, cultures/sensitivites, and clinical progression  Follow up vascular recommendations      Temp (24hrs), Avg:99.7 F (37.6 C), Min:98.9 F (37.2 C), Max:100.8 F (38.2 C)  Recent Labs  Lab 05/15/20 1559  WBC 20.6*  CREATININE 1.57*  LATICACIDVEN 1.6    CrCl cannot be calculated (Unknown ideal weight.).    No Known Allergies  Antimicrobials this admission: Vancomycin 6/22 >>  Cefepime 6/22 >>   Dose adjustments this admission: N/A  Microbiology results:   Thank you for allowing pharmacy to be a part of this patient's care.  7/22, PharmD PGY1 Pharmacy Resident [employer]: [phone]  05/16/2020 8:04 AM

## 2020-05-17 ENCOUNTER — Encounter (HOSPITAL_COMMUNITY)
Admission: AD | Disposition: A | Discharge status: Skilled Nursing Facility | Payer: Self-pay | Source: Ambulatory Visit | Attending: Family Medicine

## 2020-05-17 ENCOUNTER — Other Ambulatory Visit: Payer: Self-pay | Admitting: Physician Assistant

## 2020-05-17 ENCOUNTER — Telehealth: Payer: Self-pay | Admitting: Podiatry

## 2020-05-17 ENCOUNTER — Inpatient Hospital Stay (HOSPITAL_COMMUNITY): Payer: Medicare Other

## 2020-05-17 DIAGNOSIS — I96 Gangrene, not elsewhere classified: Secondary | ICD-10-CM

## 2020-05-17 DIAGNOSIS — M869 Osteomyelitis, unspecified: Secondary | ICD-10-CM

## 2020-05-17 DIAGNOSIS — R011 Cardiac murmur, unspecified: Secondary | ICD-10-CM

## 2020-05-17 HISTORY — PX: PERIPHERAL VASCULAR BALLOON ANGIOPLASTY: CATH118281

## 2020-05-17 HISTORY — PX: ABDOMINAL AORTOGRAM W/LOWER EXTREMITY: CATH118223

## 2020-05-17 LAB — BASIC METABOLIC PANEL
Anion gap: 9 (ref 5–15)
BUN: 16 mg/dL (ref 8–23)
CO2: 19 mmol/L — ABNORMAL LOW (ref 22–32)
Calcium: 8.4 mg/dL — ABNORMAL LOW (ref 8.9–10.3)
Chloride: 109 mmol/L (ref 98–111)
Creatinine, Ser: 1.24 mg/dL (ref 0.61–1.24)
GFR calc Af Amer: >60 mL/min (ref >60)
GFR calc non Af Amer: >60 mL/min (ref >60)
Glucose, Bld: 128 mg/dL — ABNORMAL HIGH (ref 70–99)
Potassium: 4.4 mmol/L (ref 3.5–5.1)
Sodium: 137 mmol/L (ref 135–145)

## 2020-05-17 LAB — IRON AND TIBC
Iron: 19 ug/dL — ABNORMAL LOW (ref 45–182)
Saturation Ratios: 12 % — ABNORMAL LOW (ref 17.9–39.5)
TIBC: 154 ug/dL — ABNORMAL LOW (ref 250–450)
UIBC: 135 ug/dL

## 2020-05-17 LAB — LIPID PANEL
Cholesterol: 114 mg/dL (ref 0–200)
HDL: 37 mg/dL — ABNORMAL LOW (ref >40)
LDL Cholesterol: 71 mg/dL (ref 0–99)
Total CHOL/HDL Ratio: 3.1 RATIO
Triglycerides: 31 mg/dL (ref <150)
VLDL: 6 mg/dL (ref 0–40)

## 2020-05-17 LAB — CBC
HCT: 25 % — ABNORMAL LOW (ref 39.0–52.0)
Hemoglobin: 8.3 g/dL — ABNORMAL LOW (ref 13.0–17.0)
MCH: 27.7 pg (ref 26.0–34.0)
MCHC: 33.2 g/dL (ref 30.0–36.0)
MCV: 83.3 fL (ref 80.0–100.0)
Platelets: 287 10*3/uL (ref 150–400)
RBC: 3 MIL/uL — ABNORMAL LOW (ref 4.22–5.81)
RDW: 13.4 % (ref 11.5–15.5)
WBC: 14.9 10*3/uL — ABNORMAL HIGH (ref 4.0–10.5)
nRBC: 0 % (ref 0.0–0.2)

## 2020-05-17 LAB — ECHOCARDIOGRAM COMPLETE
Height: 73 in
Weight: 3200 oz

## 2020-05-17 LAB — URINE CULTURE

## 2020-05-17 LAB — GLUCOSE, CAPILLARY
Glucose-Capillary: 125 mg/dL — ABNORMAL HIGH (ref 70–99)
Glucose-Capillary: 127 mg/dL — ABNORMAL HIGH (ref 70–99)
Glucose-Capillary: 113 mg/dL — ABNORMAL HIGH (ref 70–99)

## 2020-05-17 LAB — POCT ACTIVATED CLOTTING TIME
Activated Clotting Time: 213 seconds
Activated Clotting Time: 224 seconds
Activated Clotting Time: 224 seconds

## 2020-05-17 LAB — HEPATITIS C ANTIBODY: HCV Ab: NONREACTIVE

## 2020-05-17 LAB — SURGICAL PCR SCREEN
MRSA, PCR: NEGATIVE
Staphylococcus aureus: NEGATIVE

## 2020-05-17 LAB — FERRITIN: Ferritin: 625 ng/mL — ABNORMAL HIGH (ref 24–336)

## 2020-05-17 SURGERY — ABDOMINAL AORTOGRAM W/LOWER EXTREMITY
Anesthesia: LOCAL

## 2020-05-17 SURGERY — AMPUTATION, FOOT, TRANSMETATARSAL
Anesthesia: General | Site: Toe | Laterality: Right

## 2020-05-17 MED ORDER — SODIUM CHLORIDE 0.9 % IV SOLN: 250.0000 mL | INTRAVENOUS | Status: DC | PRN

## 2020-05-17 MED ORDER — HEPARIN SODIUM (PORCINE) 1000 UNIT/ML IJ SOLN
INTRAMUSCULAR | Status: AC
  Filled 2020-05-17: qty 1

## 2020-05-17 MED ORDER — FENTANYL CITRATE (PF) 100 MCG/2ML IJ SOLN
INTRAMUSCULAR | Status: DC | PRN
  Administered 2020-05-17 (×2): 25 ug via INTRAVENOUS

## 2020-05-17 MED ORDER — SODIUM CHLORIDE 0.9 % IV SOLN
2.0000 g | Freq: Three times a day (TID) | INTRAVENOUS | Status: AC
  Administered 2020-05-18 – 2020-05-20 (×8): 2 g via INTRAVENOUS
  Filled 2020-05-17 (×10): qty 2

## 2020-05-17 MED ORDER — SODIUM CHLORIDE 0.9% FLUSH
3.0000 mL | Freq: Two times a day (BID) | INTRAVENOUS | Status: DC
  Administered 2020-05-17: 3 mL via INTRAVENOUS

## 2020-05-17 MED ORDER — HEPARIN SODIUM (PORCINE) 1000 UNIT/ML IJ SOLN
INTRAMUSCULAR | Status: DC | PRN
  Administered 2020-05-17: 3000 [IU] via INTRAVENOUS
  Administered 2020-05-17: 7000 [IU] via INTRAVENOUS
  Administered 2020-05-17: 3000 [IU] via INTRAVENOUS

## 2020-05-17 MED ORDER — SODIUM CHLORIDE 0.9% FLUSH: 3.0000 mL | INTRAVENOUS | Status: DC | PRN

## 2020-05-17 MED ORDER — ACETAMINOPHEN 500 MG PO TABS
1000.0000 mg | ORAL_TABLET | Freq: Four times a day (QID) | ORAL | Status: DC
  Administered 2020-05-17: 1000 mg via ORAL
  Filled 2020-05-17: qty 2

## 2020-05-17 MED ORDER — LIDOCAINE HCL (PF) 1 % IJ SOLN
INTRAMUSCULAR | Status: AC
  Filled 2020-05-17: qty 30

## 2020-05-17 MED ORDER — OXYCODONE HCL 5 MG PO TABS
5.0000 mg | ORAL_TABLET | ORAL | Status: DC
  Administered 2020-05-17 – 2020-05-20 (×13): 5 mg via ORAL
  Filled 2020-05-17 (×13): qty 1

## 2020-05-17 MED ORDER — MIDAZOLAM HCL 2 MG/2ML IJ SOLN
INTRAMUSCULAR | Status: AC
  Filled 2020-05-17: qty 2

## 2020-05-17 MED ORDER — HEPARIN (PORCINE) IN NACL 1000-0.9 UT/500ML-% IV SOLN
INTRAVENOUS | Status: AC
  Filled 2020-05-17: qty 1000

## 2020-05-17 MED ORDER — SODIUM CHLORIDE 0.9 % WEIGHT BASED INFUSION: 1.0000 mL/kg/h | INTRAVENOUS | Status: AC

## 2020-05-17 MED ORDER — IODIXANOL 320 MG/ML IV SOLN
INTRAVENOUS | Status: DC | PRN
  Administered 2020-05-17: 130 mL via INTRA_ARTERIAL

## 2020-05-17 MED ORDER — ACETAMINOPHEN 325 MG PO TABS: 650.0000 mg | ORAL_TABLET | ORAL | Status: DC | PRN

## 2020-05-17 MED ORDER — ACETAMINOPHEN 500 MG PO TABS
1000.0000 mg | ORAL_TABLET | Freq: Three times a day (TID) | ORAL | Status: DC
  Administered 2020-05-17 – 2020-05-23 (×17): 1000 mg via ORAL
  Filled 2020-05-17 (×17): qty 2

## 2020-05-17 MED ORDER — ONDANSETRON HCL 4 MG/2ML IJ SOLN
4.0000 mg | Freq: Four times a day (QID) | INTRAMUSCULAR | Status: DC | PRN
  Administered 2020-05-19: 4 mg via INTRAVENOUS
  Filled 2020-05-17: qty 2

## 2020-05-17 MED ORDER — LIDOCAINE HCL (PF) 1 % IJ SOLN
INTRAMUSCULAR | Status: DC | PRN
  Administered 2020-05-17: 15 mL via SUBCUTANEOUS

## 2020-05-17 MED ORDER — FENTANYL CITRATE (PF) 100 MCG/2ML IJ SOLN
INTRAMUSCULAR | Status: AC
  Filled 2020-05-17: qty 2

## 2020-05-17 MED ORDER — VANCOMYCIN HCL IN DEXTROSE 1-5 GM/200ML-% IV SOLN
1000.0000 mg | Freq: Two times a day (BID) | INTRAVENOUS | Status: AC
  Administered 2020-05-17 – 2020-05-20 (×6): 1000 mg via INTRAVENOUS
  Filled 2020-05-17 (×7): qty 200

## 2020-05-17 MED ORDER — HEPARIN (PORCINE) IN NACL 1000-0.9 UT/500ML-% IV SOLN
INTRAVENOUS | Status: DC | PRN
  Administered 2020-05-17 (×2): 500 mL

## 2020-05-17 MED ORDER — SODIUM CHLORIDE 0.9 % IV SOLN: INTRAVENOUS | Status: DC

## 2020-05-17 MED ORDER — MIDAZOLAM HCL 2 MG/2ML IJ SOLN
INTRAMUSCULAR | Status: DC | PRN
  Administered 2020-05-17: 1 mg via INTRAVENOUS

## 2020-05-17 MED ORDER — OXYCODONE HCL 5 MG PO TABS: 5.0000 mg | ORAL_TABLET | ORAL | Status: DC | PRN

## 2020-05-17 MED ORDER — SODIUM CHLORIDE 0.9 % IV BOLUS
500.0000 mL | Freq: Once | INTRAVENOUS | Status: AC
  Administered 2020-05-17: 500 mL via INTRAVENOUS

## 2020-05-17 MED ORDER — SODIUM CHLORIDE 0.9% FLUSH
3.0000 mL | Freq: Two times a day (BID) | INTRAVENOUS | Status: DC
  Administered 2020-05-19 – 2020-05-22 (×2): 3 mL via INTRAVENOUS

## 2020-05-17 SURGICAL SUPPLY — 29 items
CATH CXI 2.3F 150 ST (CATHETERS) ×1 IMPLANT
CATH CXI 2.6F 65 ANG (CATHETERS) ×1
CATH OMNI FLUSH 5F 65CM (CATHETERS) ×1 IMPLANT
CATH SPRT ANG 65X2.3FR PLATN (CATHETERS) IMPLANT
CLOSURE MYNX CONTROL 6F/7F (Vascular Products) ×1 IMPLANT
DEVICE TORQUE .014-.018 (MISCELLANEOUS) IMPLANT
FILTER CO2 0.2 MICRON (VASCULAR PRODUCTS) ×1 IMPLANT
GUIDEWIRE ZILIENT 12G 014X300 (WIRE) ×1 IMPLANT
GUIDEWIRE ZILIENT 30G 014 (WIRE) ×1 IMPLANT
KIT ENCORE 26 ADVANTAGE (KITS) ×1 IMPLANT
KIT MICROPUNCTURE NIT STIFF (SHEATH) ×1 IMPLANT
KIT PV (KITS) ×3 IMPLANT
PATCH THROMBIX TOPICAL PLAIN (HEMOSTASIS) ×1 IMPLANT
RESERVOIR CO2 (VASCULAR PRODUCTS) ×1 IMPLANT
SET FLUSH CO2 (MISCELLANEOUS) ×1 IMPLANT
SHEATH GLIDE SLENDER 4/5FR (SHEATH) ×1 IMPLANT
SHEATH MICROPUNCTURE PEDAL 4FR (SHEATH) ×1 IMPLANT
SHEATH PINNACLE 5F 10CM (SHEATH) ×1 IMPLANT
SHEATH PINNACLE 6F 10CM (SHEATH) ×1 IMPLANT
SHEATH PINNACLE ST 6F 45CM (SHEATH) ×1 IMPLANT
SHEATH PROBE COVER 6X72 (BAG) ×1 IMPLANT
STOPCOCK MORSE 400PSI 3WAY (MISCELLANEOUS) ×1 IMPLANT
SYR MEDRAD MARK 7 150ML (SYRINGE) ×3 IMPLANT
TORQUE DEVICE .014-.018 (MISCELLANEOUS) ×3
TRANSDUCER W/STOPCOCK (MISCELLANEOUS) ×3 IMPLANT
TRAY PV CATH (CUSTOM PROCEDURE TRAY) ×3 IMPLANT
TUBING CIL FLEX 10 FLL-RA (TUBING) ×1 IMPLANT
WIRE HI TORQ COMMND ES.014X300 (WIRE) ×2 IMPLANT
WIRE STARTER BENTSON 035X150 (WIRE) ×1 IMPLANT

## 2020-05-17 NOTE — Telephone Encounter (Signed)
I spoke to her this morning. Thanks.

## 2020-05-17 NOTE — Progress Notes (Signed)
Dr. Lajoyce Corners asked vascular surgery to see this patient for second opinion regarding critical limb ischemia of the right lower extremity with tissue loss.  Plan was to proceed with arteriogram today with possible intervention.  He subsequently has been posted by Dr. Jacinto Halim for arteriogram who initially evaluated the patient yesterday.  Please call vascular if we can be of any further assistance.  Cephus Shelling, MD [facility] Office: [phone]   Cephus Shelling

## 2020-05-17 NOTE — Progress Notes (Signed)
Pt refused am labs.  MD notified.

## 2020-05-17 NOTE — Progress Notes (Signed)
PHARMACY NOTE:  ANTIMICROBIAL RENAL DOSAGE ADJUSTMENT  Current antimicrobial regimen includes a mismatch between antimicrobial dosage and estimated renal function.  As per policy approved by the Pharmacy & Therapeutics and Medical Executive Committees, the antimicrobial dosage will be adjusted accordingly.  Current antimicrobial dosage:   Vancomycin 750 mg IV q12hr Cefepime 2 gm IV q12hr  Indication: Osteomyelitis   Renal Function:  Estimated Creatinine Clearance: 66.2 mL/min (by C-G formula based on SCr of 1.24 mg/dL). Improving from admission.     Antimicrobial dosage has been changed to:   Vancomycin 1000 mg IV q12hr Cefepime 2 gm IV q8hr   Thank you for allowing pharmacy to be a part of this patient's care.  Charlett Nose, PharmD  PGY1 Acute Care Pharmacy Resident 05/17/2020 3:05 PM

## 2020-05-17 NOTE — Telephone Encounter (Signed)
Patient was admitted to [facility] and Provider would like to spoeak to Dr. Ardelle Anton about future appointments with orthopedics

## 2020-05-17 NOTE — Progress Notes (Signed)
Spoke with Dr. Lajoyce Corners.  Surgery for transmetatarsal amputation canceled for today as patient has three-vessel blockage and to the right leg.  Visited with patient and explained to him to remain n.p.o. as may have vascular intervention today.  Patient was sleepy but did respond to that he understood

## 2020-05-17 NOTE — Progress Notes (Signed)
I have discussed with the patient regarding recommendation from Dr. Duda and to proceed with Peripheral arteriogram to see if we can improve circulation so he can have transmetatarsal amputation. I have set him up for PV angio. Discussed with the patient and he is willing.   Will schedule for peripheral arteriogram and possible angioplasty given symptoms. Patient understands the risks, benefits, alternatives including medical therapy, CT angiography. Patient understands <1-2% risk of death, embolic complications, bleeding, infection, renal failure, urgent surgical revascularization, but not limited to these and wants to proceed.    Dela Sweeny, MD, FACC 05/17/2020, 9:36 AM Office: [phone]  

## 2020-05-17 NOTE — H&P (View-Only) (Signed)
° °ORTHOPAEDIC CONSULTATION ° °REQUESTING PHYSICIAN: Brown, Carina M, MD ° °Chief Complaint: Painful gangrene right foot second toe status post great toe amputation. ° °HPI: °Ricky Hanson is a 69 y.o. male who presents with painful gangrene of the second toe right foot with an ischemic ulcer over the dorsum of the right foot.  Patient is status post great toe amputation on the right that is healed well.  Patient has type 2 diabetes and is a smoker.  Patient has been seen by cardiology and not felt to be a good candidate for endovascular evaluation secondary to the gangrene of the foot. ° °Past Medical History:  °Diagnosis Date  °• Elevated random blood glucose level   °• Foley catheter in place   °• Hyperlipidemia   °• Hypertension   °• Schizophrenia (HCC)   ° °Past Surgical History:  °Procedure Laterality Date  °• urologic procedure after trauma    ° °Social History  ° °Socioeconomic History  °• Marital status: Single  °  Spouse name: Not on file  °• Number of children: Not on file  °• Years of education: Not on file  °• Highest education level: Not on file  °Occupational History  °• Not on file  °Tobacco Use  °• Smoking status: Current Every Day Smoker  °  Packs/day: 0.50  °  Types: Cigarettes  °• Smokeless tobacco: Never Used  °Substance and Sexual Activity  °• Alcohol use: Never  °• Drug use: Never  °• Sexual activity: Not on file  °Other Topics Concern  °• Not on file  °Social History Narrative  °• Not on file  ° °Social Determinants of Health  ° °Financial Resource Strain:   °• Difficulty of Paying Living Expenses:   °Food Insecurity:   °• Worried About Running Out of Food in the Last Year:   °• Ran Out of Food in the Last Year:   °Transportation Needs:   °• Lack of Transportation (Medical):   °• Lack of Transportation (Non-Medical):   °Physical Activity:   °• Days of Exercise per Week:   °• Minutes of Exercise per Session:   °Stress:   °• Feeling of Stress :   °Social Connections:   °• Frequency of  Communication with Friends and Family:   °• Frequency of Social Gatherings with Friends and Family:   °• Attends Religious Services:   °• Active Member of Clubs or Organizations:   °• Attends Club or Organization Meetings:   °• Marital Status:   ° °History reviewed. No pertinent family history. °- negative except otherwise stated in the family history section °No Known Allergies °Prior to Admission medications   °Medication Sig Start Date End Date Taking? Authorizing Provider  °acetaminophen (TYLENOL) 500 MG tablet Take 1,000 mg by mouth every 6 (six) hours as needed for mild pain.   Yes [provider]  °benztropine (COGENTIN) 1 MG tablet Take 1 mg by mouth at bedtime.   Yes [provider]  °haloperidol decanoate (HALDOL DECANOATE) 100 MG/ML injection Inject 100 mg into the muscle every 28 (twenty-eight) days. Inject 2 mLs every 4 weeks   Yes [provider]  ° °DG Chest 1 View ° °Result Date: 05/16/2020 °CLINICAL DATA:  Shortness of breath. EXAM: CHEST  1 VIEW COMPARISON:  None. FINDINGS: There is no evidence of acute infiltrate, pleural effusion or pneumothorax. The heart size and mediastinal contours are within normal limits. There is mild calcification of the aortic arch. The visualized skeletal structures are unremarkable. IMPRESSION: No   active disease. Electronically Signed   By: Thaddeus  Houston M.D.   On: 05/16/2020 16:36  ° °DG Foot Complete Right ° °Result Date: 05/16/2020 °CLINICAL DATA:  Foot pain EXAM: RIGHT FOOT COMPLETE - 3+ VIEW COMPARISON:  04/16/2020 FINDINGS: Postsurgical changes are noted with amputation of the first digit. Soft tissue swelling and subcutaneous air is noted in the second digit consistent with localized infection. There has been interval bony destruction at the distal aspect of the second proximal phalanx. Persistent resorption at the phalangeal tuft of the second digit is noted. These changes are consistent with osteomyelitis. IMPRESSION: Progressive  soft tissue swelling, subcutaneous air and bony destruction in the second digit as described. These changes are consistent with cellulitis and underlying osteomyelitis. Electronically Signed   By: Mark  Lukens M.D.   On: 05/16/2020 08:44  ° °- pertinent xrays, CT, MRI studies were reviewed and independently interpreted ° °Positive ROS: All other systems have been reviewed and were otherwise negative with the exception of those mentioned in the HPI and as above. ° °Physical Exam: °General: Alert, no acute distress °Psychiatric: Patient is competent for consent with normal mood and affect °Lymphatic: No axillary or cervical lymphadenopathy °Cardiovascular: No pedal edema °Respiratory: No cyanosis, no use of accessory musculature °GI: No organomegaly, abdomen is soft and non-tender ° ° ° °Images: ° °@ENCIMAGES@ ° °Labs: ° °Lab Results  °Component Value Date  ° HGBA1C 5.5 02/02/2020  ° ° °Lab Results  °Component Value Date  ° ALBUMIN 3.0 (L) 05/15/2020  ° ALBUMIN 4.1 02/02/2020  ° ° °Neurologic: Patient does not have protective sensation bilateral lower extremities. ° ° °MUSCULOSKELETAL:  ° °Skin: Examination patient has an ischemic ulcer dorsally over the right foot with dry gangrene of the second toe, with foul-smelling drainage, there is no ascending cellulitis. ° °Patient has a strong femoral pulse I cannot palpate a popliteal pulse or anterior tibial pulse.  Patient's ankle-brachial indices shows monophasic flow at the ankle with an ABI of approximately 60% on the right 70% on the left. 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 ° °Plan: °Cardiology does not feel that it is safe to proceed with endovascular evaluation secondary to the gangrene to the right foot.   With occlusion of all 3 tibial vessels and the ischemic ulcer dorsally over the  midfoot patient is a high risk for not healing a transmetatarsal amputation or a transtibial amputation.  After discussing treatment options with the patient I have recommended vascular study to try to improve circulation to the foot with an effort to heal a transmetatarsal amputation.  I will consult vascular vein surgery.  Will hold on transmetatarsal amputation at this time. ° ° °Thank you for the consult and the opportunity to see Mr. Porcelli ° °Kyilee Gregg, MD °[facility] °[phone] °7:15 AM ° ° ° ° ° °

## 2020-05-17 NOTE — Progress Notes (Signed)
  Echocardiogram 2D Echocardiogram has been performed.  Ricky Hanson G Saunders [place] 05/17/2020, 10:50 AM

## 2020-05-17 NOTE — Interval H&P Note (Signed)
History and Physical Interval Note:  05/17/2020 3:22 PM  Ricky Hanson  has presented today for surgery, with the diagnosis of PAD.  The various methods of treatment have been discussed with the patient and family. After consideration of risks, benefits and other options for treatment, the patient has consented to  Procedure(s): ABDOMINAL AORTOGRAM W/LOWER EXTREMITY (N/A) and possible angioplasty as a surgical intervention.  The patient's history has been reviewed, patient examined, no change in status, stable for surgery.  I have reviewed the patient's chart and labs.  Questions were answered to the patient's satisfaction.     Yates Decamp

## 2020-05-17 NOTE — Progress Notes (Signed)
Family Medicine Teaching Service Daily Progress Note Intern Pager: [phone]  Patient name: Ricky Hanson Medical record number: [id number] Date of birth: 04/14/1951 Age: 69 y.o. Gender: male  Primary Care Provider: Hoy Register, MD Consultants: Orthopedics, vascular, cardiology Code Status: Full code  Pt Overview and Major Events to Date:  6/22-patient admitted for gangrene of second toe on right lower extremity  Assessment and Plan: Ricky Hanson is a 69 y.o. male presenting with infection of right second toe. 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  He has continued on vancomycin, cefepime, Zosyn.  WBC improved at 14.9 from 17 yesterday.  Patient is followed by Dr. Shella Spearing.  After admission there is a consult for Dr. Jacinto Halim to evaluate with peripheral arteriogram to see if circumflex chelation can be improved prior to amputation.  Dr. Lajoyce Corners also on board and may be performing amputation depending on timing.  Peripheral arteriogram and possible angioplasty is being scheduled. -Ortho following, appreciate recommendations -Cardiology following, appreciate recommendations -Follow-up on peripheral arteriogram -Continue IV vancomycin and cefepime -N.p.o. at this time given possible procedures -Maintenance IV fluids while n.p.o. -Scheduled Tylenol 1000 mg every 8 hours -5 mg oxycodone every 4 hours as needed  Peripheral vascular disease Dr. Jacinto Halim following and consulted for possible revascularization prior to amputation.  Cardiology also following. -Aspirin 81 mg and rivaroxaban per cardiology -Follow-up on peripheral arteriogram  Hypertension Medications include amlodipine 10 mg daily, lisinopril 10 mg daily.  Patient's cousin helps with his medications and reports good adherence. -Continue home amlodipine and lisinopril -Vitals per  protocol  Hyperlipidemia Lipid panel on 01/2020 shows elevated total cholesterol at 182, HDL 64, LDL 1 5.  Patient is currently on atorvastatin 10 mg daily. -Atorvastatin increased to 20 mg daily on admission  Prediabetes Patient's hemoglobin A1c in March 2021 was 5.5. -Sliding scale insulin -Heart healthy diabetic diet  AKI on CKD 3A Creatinine 1.81 on admission.  This morning improved to 1.24. -Continue IV hydration -Morning BMPs -Avoid nephrotoxic agents  Normocytic anemia Hemoglobin 8.3 today.  Iron panel significant for low iron at 19, decreased total iron-binding capacity at 154, decreased saturation ratio and increased ferritin is consistent with iron deficiency anemia. -Consider Feraheme -Morning CBCs  Chronic indwelling Foley Patient denies any symptoms of UTI on admission.  Culture is still pending.  Patient has apparent soft tissue source.  Hx of presumed schizophrenia Records requested.  Patient on haloperidol per med rec.  Reports last injection was in May.   FEN/GI: N.p.o. at this time PPx: Patient is on Xarelto with SCD on one leg  Disposition: Pending evaluation by vascular then possible amputation by orthopedics  Subjective:  Patient is laying in bed looking uncomfortable and in the room.  He is unwilling to speak at length about anything.  He says that he is in pain and that is why he does not want to talk.  Rates the pain a 5 out of 10 on the pain scale.  Is requesting more pain medication.  Denies any shortness of breath or chest pain, denies any chills.  Objective: Temp:  [98.8 F (37.1 C)-99.4 F (37.4 C)] 99.4 F (37.4 C) (06/23 0332) Pulse Rate:  [71-90] 71 (06/23 0332) Resp:  [14-20] 14 (06/23 0332) BP: (100-157)/(52-90) 100/52 (06/23 0332) SpO2:  [97 %-100 %] 97 % (06/23 0332) Weight:  [90.7 kg] 90.7  kg (06/22 0800) Physical Exam: General: Under the covers, looking uncomfortable Cardiovascular: Regular rate and rhythm, no murmurs  appreciated Respiratory: Normal work of breathing, oxygen saturation Abdomen: Soft, nontender, positive bowel sounds Extremities: Right lower extremity with blackened second toe, right lower extremity is edematous and warm compared to the left lower extremity  Laboratory: Recent Labs  Lab 05/15/20 1559 05/16/20 1800  WBC 20.6* 17.5*  HGB 10.3* 9.1*  HCT 31.8* 27.8*  PLT 323 319   Recent Labs  Lab 05/15/20 1559 05/16/20 0844  NA 130* 131*  K 4.5 4.7  CL 99 98  CO2 21* 21*  BUN 15 20  CREATININE 1.57* 1.81*  CALCIUM 9.0 9.1  PROT 7.6  --   BILITOT 0.8  --   ALKPHOS 140*  --   ALT 40  --   AST 24  --   GLUCOSE 159* 179*   Results for STEED, KANAAN (MRN [id number]) as of 05/17/2020 14:27  Ref. Range 05/17/2020 12:05  Total CHOL/HDL Ratio Latest Units: RATIO 3.1  Cholesterol Latest Ref Range: 0 - 200 mg/dL 446  HDL Cholesterol Latest Ref Range: >40 mg/dL 37 (L)  LDL (calc) Latest Ref Range: 0 - 99 mg/dL 71  Triglycerides Latest Ref Range: <150 mg/dL 31  VLDL Latest Ref Range: 0 - 40 mg/dL 6  Iron Latest Ref Range: 45 - 182 ug/dL 19 (L)  UIBC Latest Units: ug/dL 286  TIBC Latest Ref Range: 250 - 450 ug/dL 381 (L)  Saturation Ratios Latest Ref Range: 17.9 - 39.5 % 12 (L)  Ferritin Latest Ref Range: 24 - 336 ng/mL 625 (H)    Imaging/Diagnostic Tests: DG Chest 1 View  Result Date: 05/16/2020 CLINICAL DATA:  Shortness of breath. EXAM: CHEST  1 VIEW COMPARISON:  None. FINDINGS: There is no evidence of acute infiltrate, pleural effusion or pneumothorax. The heart size and mediastinal contours are within normal limits. There is mild calcification of the aortic arch. The visualized skeletal structures are unremarkable. IMPRESSION: No active disease. Electronically Signed   By: Aram Candela M.D.   On: 05/16/2020 16:36   DG Foot Complete Right  Result Date: 05/16/2020 CLINICAL DATA:  Foot pain EXAM: RIGHT FOOT COMPLETE - 3+ VIEW COMPARISON:  04/16/2020 FINDINGS:  Postsurgical changes are noted with amputation of the first digit. Soft tissue swelling and subcutaneous air is noted in the second digit consistent with localized infection. There has been interval bony destruction at the distal aspect of the second proximal phalanx. Persistent resorption at the phalangeal tuft of the second digit is noted. These changes are consistent with osteomyelitis. IMPRESSION: Progressive soft tissue swelling, subcutaneous air and bony destruction in the second digit as described. These changes are consistent with cellulitis and underlying osteomyelitis. Electronically Signed   By: Alcide Clever M.D.   On: 05/16/2020 08:44   ECHOCARDIOGRAM COMPLETE  Result Date: 05/17/2020    ECHOCARDIOGRAM REPORT   Patient Name:   KAYDAN WONG Date of Exam: 05/17/2020 Medical Rec #:  [id number]      Height:       73.0 in Accession #:    [id number]     Weight:       200.0 lb Date of Birth:  [birth year]      BSA:          2.152 m Patient Age:    68 years       BP:           100/52 mmHg Patient Gender: M  HR:           65 bpm. Exam Location:  Inpatient Procedure: 2D Echo, Cardiac Doppler and Color Doppler Indications:    R01.1 Murmur  History:        Patient has no prior history of Echocardiogram examinations.  Sonographer:    Tiffany Dance Referring Phys: [id number] CARINA M BROWN IMPRESSIONS  1. Left ventricular ejection fraction, by estimation, is 65 to 70%. The left ventricle has normal function. The left ventricle has no regional wall motion abnormalities. There is mild asymmetric left ventricular hypertrophy. Left ventricular diastolic parameters were normal.  2. Right ventricular systolic function is normal. The right ventricular size is normal.  3. The mitral valve is normal in structure. Trivial mitral valve regurgitation. No evidence of mitral stenosis.  4. The aortic valve is normal in structure. Aortic valve regurgitation is not visualized. No aortic stenosis is present. FINDINGS  Left  Ventricle: Left ventricular ejection fraction, by estimation, is 65 to 70%. The left ventricle has normal function. The left ventricle has no regional wall motion abnormalities. The left ventricular internal cavity size was normal in size. There is  mild asymmetric left ventricular hypertrophy. Left ventricular diastolic parameters were normal. Right Ventricle: The right ventricular size is normal. No increase in right ventricular wall thickness. Right ventricular systolic function is normal. Left Atrium: Left atrial size was normal in size. Right Atrium: Right atrial size was normal in size. Pericardium: There is no evidence of pericardial effusion. Mitral Valve: The mitral valve is normal in structure. Trivial mitral valve regurgitation. No evidence of mitral valve stenosis. Tricuspid Valve: The tricuspid valve is normal in structure. Tricuspid valve regurgitation is not demonstrated. No evidence of tricuspid stenosis. Aortic Valve: The aortic valve is normal in structure. Aortic valve regurgitation is not visualized. No aortic stenosis is present. Pulmonic Valve: The pulmonic valve was normal in structure. Pulmonic valve regurgitation is not visualized. No evidence of pulmonic stenosis. Aorta: The aortic root and ascending aorta are structurally normal, with no evidence of dilitation. IAS/Shunts: The atrial septum is grossly normal.  LEFT VENTRICLE PLAX 2D LVIDd:         4.20 cm  Diastology LVIDs:         2.60 cm  LV e' lateral:   8.49 cm/s LV PW:         1.30 cm  LV E/e' lateral: 13.3 LV IVS:        1.10 cm  LV e' medial:    6.53 cm/s LVOT diam:     2.30 cm  LV E/e' medial:  17.3 LV SV:         98 LV SV Index:   46 LVOT Area:     4.15 cm  RIGHT VENTRICLE             IVC RV Basal diam:  2.20 cm     IVC diam: 1.60 cm RV S prime:     11.85 cm/s TAPSE (M-mode): 2.3 cm LEFT ATRIUM             Index       RIGHT ATRIUM           Index LA diam:        3.40 cm 1.58 cm/m  RA Area:     12.30 cm LA Vol (A2C):   72.1 ml  33.51 ml/m RA Volume:   24.40 ml  11.34 ml/m LA Vol (A4C):   39.6 ml 18.40 ml/m LA Biplane Vol: 55.3  ml 25.70 ml/m  AORTIC VALVE LVOT Vmax:   107.00 cm/s LVOT Vmean:  66.200 cm/s LVOT VTI:    0.237 m  AORTA Ao Root diam: 3.80 cm Ao Asc diam:  3.60 cm MITRAL VALVE MV Area (PHT): 3.65 cm     SHUNTS MV Decel Time: 208 msec     Systemic VTI:  0.24 m MV E velocity: 113.00 cm/s  Systemic Diam: 2.30 cm MV A velocity: 118.00 cm/s MV E/A ratio:  0.96 Kristeen Miss MD Electronically signed by Kristeen Miss MD Signature Date/Time: 05/17/2020/12:58:52 PM    Final      Derrel Nip, MD 05/17/2020, 5:38 AM PGY-1, [place] FPTS Intern pager: [phone], text pages welcome

## 2020-05-17 NOTE — Consult Note (Addendum)
ORTHOPAEDIC CONSULTATION  REQUESTING PHYSICIAN: Westley Chandler, MD  Chief Complaint: Painful gangrene right foot second toe status post great toe amputation.  HPI: Ricky Hanson is a 69 y.o. male who presents with painful gangrene of the second toe right foot with an ischemic ulcer over the dorsum of the right foot.  Patient is status post great toe amputation on the right that is healed well.  Patient has type 2 diabetes and is a smoker.  Patient has been seen by cardiology and not felt to be a good candidate for endovascular evaluation secondary to the gangrene of the foot.  Past Medical History:  Diagnosis Date   Elevated random blood glucose level    Foley catheter in place    Hyperlipidemia    Hypertension    Schizophrenia (HCC)    Past Surgical History:  Procedure Laterality Date   urologic procedure after trauma     Social History   Socioeconomic History   Marital status: Single    Spouse name: Not on file   Number of children: Not on file   Years of education: Not on file   Highest education level: Not on file  Occupational History   Not on file  Tobacco Use   Smoking status: Current Every Day Smoker    Packs/day: 0.50    Types: Cigarettes   Smokeless tobacco: Never Used  Substance and Sexual Activity   Alcohol use: Never   Drug use: Never   Sexual activity: Not on file  Other Topics Concern   Not on file  Social History Narrative   Not on file   Social Determinants of Health   Financial Resource Strain:    Difficulty of Paying Living Expenses:   Food Insecurity:    Worried About Programme researcher, broadcasting/film/video in the Last Year:    Barista in the Last Year:   Transportation Needs:    Freight forwarder (Medical):    Lack of Transportation (Non-Medical):   Physical Activity:    Days of Exercise per Week:    Minutes of Exercise per Session:   Stress:    Feeling of Stress :   Social Connections:    Frequency of  Communication with Friends and Family:    Frequency of Social Gatherings with Friends and Family:    Attends Religious Services:    Active Member of Clubs or Organizations:    Attends Banker Meetings:    Marital Status:    History reviewed. No pertinent family history. - negative except otherwise stated in the family history section No Known Allergies Prior to Admission medications   Medication Sig Start Date End Date Taking? Authorizing Provider  acetaminophen (TYLENOL) 500 MG tablet Take 1,000 mg by mouth every 6 (six) hours as needed for mild pain.   Yes [provider]  benztropine (COGENTIN) 1 MG tablet Take 1 mg by mouth at bedtime.   Yes [provider]  haloperidol decanoate (HALDOL DECANOATE) 100 MG/ML injection Inject 100 mg into the muscle every 28 (twenty-eight) days. Inject 2 mLs every 4 weeks   Yes [provider]   DG Chest 1 View  Result Date: 05/16/2020 CLINICAL DATA:  Shortness of breath. EXAM: CHEST  1 VIEW COMPARISON:  None. FINDINGS: There is no evidence of acute infiltrate, pleural effusion or pneumothorax. The heart size and mediastinal contours are within normal limits. There is mild calcification of the aortic arch. The visualized skeletal structures are unremarkable. IMPRESSION: No  active disease. Electronically Signed   By: Virgina Norfolk M.D.   On: 05/16/2020 16:36   DG Foot Complete Right  Result Date: 05/16/2020 CLINICAL DATA:  Foot pain EXAM: RIGHT FOOT COMPLETE - 3+ VIEW COMPARISON:  04/16/2020 FINDINGS: Postsurgical changes are noted with amputation of the first digit. Soft tissue swelling and subcutaneous air is noted in the second digit consistent with localized infection. There has been interval bony destruction at the distal aspect of the second proximal phalanx. Persistent resorption at the phalangeal tuft of the second digit is noted. These changes are consistent with osteomyelitis. IMPRESSION: Progressive  soft tissue swelling, subcutaneous air and bony destruction in the second digit as described. These changes are consistent with cellulitis and underlying osteomyelitis. Electronically Signed   By: Inez Catalina M.D.   On: 05/16/2020 08:44   - pertinent xrays, CT, MRI studies were reviewed and independently interpreted  Positive ROS: All other systems have been reviewed and were otherwise negative with the exception of those mentioned in the HPI and as above.  Physical Exam: General: Alert, no acute distress Psychiatric: Patient is competent for consent with normal mood and affect Lymphatic: No axillary or cervical lymphadenopathy Cardiovascular: No pedal edema Respiratory: No cyanosis, no use of accessory musculature GI: No organomegaly, abdomen is soft and non-tender    Images:  @ENCIMAGES @  Labs:  Lab Results  Component Value Date   HGBA1C 5.5 02/02/2020    Lab Results  Component Value Date   ALBUMIN 3.0 (L) 05/15/2020   ALBUMIN 4.1 02/02/2020    Neurologic: Patient does not have protective sensation bilateral lower extremities.   MUSCULOSKELETAL:   Skin: Examination patient has an ischemic ulcer dorsally over the right foot with dry gangrene of the second toe, with foul-smelling drainage, there is no ascending cellulitis.  Patient has a strong femoral pulse I cannot palpate a popliteal pulse or anterior tibial pulse.  Patient's ankle-brachial indices shows monophasic flow at the ankle with an ABI of approximately 60% on the right 70% on the left.  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  Plan: Cardiology does not feel that it is safe to proceed with endovascular evaluation secondary to the gangrene to the right foot.   With occlusion of all 3 tibial vessels and the ischemic ulcer dorsally over the  midfoot patient is a high risk for not healing a transmetatarsal amputation or a transtibial amputation.  After discussing treatment options with the patient I have recommended vascular study to try to improve circulation to the foot with an effort to heal a transmetatarsal amputation.  I will consult vascular vein surgery.  Will hold on transmetatarsal amputation at this time.   Thank you for the consult and the opportunity to see Mr. Vidal Lampkins, [place] [phone] 7:15 AM

## 2020-05-17 NOTE — H&P (View-Only) (Signed)
I have discussed with the patient regarding recommendation from Dr. Lajoyce Corners and to proceed with Peripheral arteriogram to see if we can improve circulation so he can have transmetatarsal amputation. I have set him up for PV angio. Discussed with the patient and he is willing.   Will schedule for peripheral arteriogram and possible angioplasty given symptoms. Patient understands the risks, benefits, alternatives including medical therapy, CT angiography. Patient understands <1-2% risk of death, embolic complications, bleeding, infection, renal failure, urgent surgical revascularization, but not limited to these and wants to proceed.    Yates Decamp, MD, [facility] 05/17/2020, 9:36 AM Office: [phone]

## 2020-05-17 NOTE — Progress Notes (Signed)
Saw the patient as he was getting back from his angio.  Since I saw him yesterday orthopedics has been consulted as well as vascular surgery.  I can attempt a transmetatarsal amputation but not sure this will heal based on current circulation status. Please let me know if I can assist in any way.   Ouida Sills, [place] O: [phone] C: [phone]

## 2020-05-18 ENCOUNTER — Inpatient Hospital Stay (HOSPITAL_COMMUNITY): Payer: Medicare Other

## 2020-05-18 ENCOUNTER — Encounter (HOSPITAL_COMMUNITY): Payer: Self-pay | Admitting: Cardiology

## 2020-05-18 ENCOUNTER — Other Ambulatory Visit: Payer: Self-pay | Admitting: Physician Assistant

## 2020-05-18 DIAGNOSIS — M869 Osteomyelitis, unspecified: Secondary | ICD-10-CM

## 2020-05-18 DIAGNOSIS — L0390 Cellulitis, unspecified: Secondary | ICD-10-CM

## 2020-05-18 DIAGNOSIS — R609 Edema, unspecified: Secondary | ICD-10-CM

## 2020-05-18 DIAGNOSIS — I10 Essential (primary) hypertension: Secondary | ICD-10-CM

## 2020-05-18 DIAGNOSIS — E1159 Type 2 diabetes mellitus with other circulatory complications: Secondary | ICD-10-CM

## 2020-05-18 DIAGNOSIS — M86171 Other acute osteomyelitis, right ankle and foot: Secondary | ICD-10-CM

## 2020-05-18 DIAGNOSIS — I739 Peripheral vascular disease, unspecified: Secondary | ICD-10-CM

## 2020-05-18 DIAGNOSIS — I96 Gangrene, not elsewhere classified: Secondary | ICD-10-CM

## 2020-05-18 LAB — CBC
HCT: 24.4 % — ABNORMAL LOW (ref 39.0–52.0)
Hemoglobin: 8.1 g/dL — ABNORMAL LOW (ref 13.0–17.0)
MCH: 28 pg (ref 26.0–34.0)
MCHC: 33.2 g/dL (ref 30.0–36.0)
MCV: 84.4 fL (ref 80.0–100.0)
Platelets: 306 10*3/uL (ref 150–400)
RBC: 2.89 MIL/uL — ABNORMAL LOW (ref 4.22–5.81)
RDW: 13.8 % (ref 11.5–15.5)
WBC: 13.4 10*3/uL — ABNORMAL HIGH (ref 4.0–10.5)
nRBC: 0 % (ref 0.0–0.2)

## 2020-05-18 LAB — BASIC METABOLIC PANEL
Anion gap: 8 (ref 5–15)
BUN: 11 mg/dL (ref 8–23)
CO2: 19 mmol/L — ABNORMAL LOW (ref 22–32)
Calcium: 8.3 mg/dL — ABNORMAL LOW (ref 8.9–10.3)
Chloride: 109 mmol/L (ref 98–111)
Creatinine, Ser: 1.21 mg/dL (ref 0.61–1.24)
GFR calc Af Amer: >60 mL/min (ref >60)
GFR calc non Af Amer: >60 mL/min (ref >60)
Glucose, Bld: 124 mg/dL — ABNORMAL HIGH (ref 70–99)
Potassium: 3.8 mmol/L (ref 3.5–5.1)
Sodium: 136 mmol/L (ref 135–145)

## 2020-05-18 LAB — GLUCOSE, CAPILLARY
Glucose-Capillary: 115 mg/dL — ABNORMAL HIGH (ref 70–99)
Glucose-Capillary: 197 mg/dL — ABNORMAL HIGH (ref 70–99)
Glucose-Capillary: 191 mg/dL — ABNORMAL HIGH (ref 70–99)
Glucose-Capillary: 161 mg/dL — ABNORMAL HIGH (ref 70–99)

## 2020-05-18 MED ORDER — HALOPERIDOL DECANOATE 100 MG/ML IM SOLN
100.0000 mg | Freq: Once | INTRAMUSCULAR | Status: DC
  Filled 2020-05-18: qty 1

## 2020-05-18 MED ORDER — CEFAZOLIN SODIUM-DEXTROSE 2-4 GM/100ML-% IV SOLN
2.0000 g | INTRAVENOUS | Status: AC
  Administered 2020-05-19: 2 g via INTRAVENOUS
  Filled 2020-05-18 (×2): qty 100

## 2020-05-18 MED ORDER — AMLODIPINE BESYLATE 10 MG PO TABS
10.0000 mg | ORAL_TABLET | Freq: Every day | ORAL | Status: DC
  Administered 2020-05-18 – 2020-05-23 (×6): 10 mg via ORAL
  Filled 2020-05-18 (×6): qty 1

## 2020-05-18 NOTE — TOC Initial Note (Signed)
Transition of Care [facility]) - Initial/Assessment Note    Patient Details  Name: Ricky Hanson MRN: [id number] Date of Birth: 1951-03-22  Transition of Care [facility]) CM/SW Contact:    Gala Lewandowsky, RN Phone Number: 05/18/2020, 11:26 AM  Clinical Narrative:  Patient presented for sepsis, gangrene, and osteomyelitis. Case Manager received a consult for medication assistance about his injections- where does he receive  injections and if he has a psych provider. Case Manager spoke with patient and he gave permission to the Case Manager to call his cousin Mr. [name]. Mr. [name] has been the caregiver for [name] since Julien Nordmann was previously in [place]. Per cousin- he takes the patient to [facility]  [address], [place], [place] [address] [phone]. Patient's next appointment for the injection is Friday 05-19-20. Case Manager tried to contact [facility]-unable to get in touch with agency on hold over 30 minutes. Patients' PCP is Dr. Alvis Lemmings. Prior to arrival cousin states patient was independent and did not use durable medical equipment (DME) for assistance. Case Manager will continue to follow for additional transition of care needs.     Expected Discharge Plan: Home w Home Health Services Barriers to Discharge: Continued Medical Work up   Expected Discharge Plan and Services Expected Discharge Plan: Home w Home Health Services In-house Referral: NA Discharge Planning Services: CM Consult Post Acute Care Choice: Home Health Living arrangements for the past 2 months: Single Family Home   Prior Living Arrangements/Services Living arrangements for the past 2 months: Single Family Home Lives with:: Relatives (Patient has lived with his cousin since October- Previously from [place]) Patient language and need for interpreter reviewed:: Yes Do you feel safe going back to the place where you live?: Yes      Need for Family Participation in Patient Care: Yes (Comment)      Criminal Activity/Legal Involvement Pertinent to Current Situation/Hospitalization: No - Comment as needed   Permission Sought/Granted Permission sought to share information with : Family Supports, Case Manager    Emotional Assessment Appearance:: Appears stated age Attitude/Demeanor/Rapport: Guarded Affect (typically observed): Quiet Orientation: : Oriented to Self, Oriented to Place Alcohol / Substance Use: Not Applicable Psych Involvement: No (comment)  Admission diagnosis:  SOB (shortness of breath) [R06.02] Osteomyelitis (HCC) [M86.9] Osteomyelitis of right foot, unspecified type [facility]) [M86.9] Patient Active Problem List   Diagnosis Date Noted  . Osteomyelitis (HCC) 05/16/2020  . Sepsis (HCC) 05/16/2020  . Dry gangrene (HCC) 05/16/2020  . Hypertension associated with diabetes (HCC) 05/16/2020  . Dyslipidemia 05/16/2020  . Peripheral vascular disease (HCC) 05/16/2020  . Normocytic anemia 05/16/2020  . CKD (chronic kidney disease), stage III 05/16/2020  . Acute kidney injury (HCC) 05/16/2020  . Pain due to onychomycosis of toenails of both feet 03/31/2020  . 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 Traumatic amputation of toe (HCC) 03/31/2020  . Chronic indwelling Foley catheter 02/02/2020   PCP:  Hoy Register, MD Pharmacy:   [facility] - [place], [place] - [place] [address] [place] [address] Phone: [phone] Fax: [phone]   Readmission Risk Interventions No flowsheet data found.

## 2020-05-18 NOTE — Progress Notes (Signed)
D/W Dr. Lajoyce Corners. Probably best option is lower 1/3rd trans-tibial amputation due to extensive micro collaterals at this site. Probably best healing option. If not, I could revascularize the popliteal artery later if there is no flap healing. Scheduled for amputation tomorrow.   Yates Decamp, MD, [facility] 05/18/2020, 10:12 PM Office: [phone]

## 2020-05-18 NOTE — Progress Notes (Signed)
Patient ID: Ricky Hanson, male   DOB: 26-Mar-1951, 69 y.o.   MRN: [id number] I have reviewed patient's arteriogram and have discussed patient arteriogram study with Dr. Jacinto Halim this morning.  All 3 vessels at the proximal calf are occluded.  Patient has a high-grade lesion of the popliteal vessel proximally.  Patient does have good collateral circulation.  I feel patient should be able to heal a transtibial amputation.  There is insufficient circulation to heal any type of foot salvage intervention.  I have discussed this with the patient and we will plan for a below the knee amputation tomorrow Friday.

## 2020-05-18 NOTE — Progress Notes (Signed)
Lower extremity venous RT study completed.   See Cv Proc for preliminary results.   Ricky Hanson  

## 2020-05-18 NOTE — Progress Notes (Signed)
Family Medicine Teaching Service Daily Progress Note Intern Pager: [phone]  Patient name: Ricky Hanson Medical record number: [id number] Date of birth: Aug 03, 1951 Age: 69 y.o. Gender: male  Primary Care Provider: Hoy Register, MD Consultants: Orthopedics, vascular, cardiology Code Status: Full code  Pt Overview and Major Events to Date:  6/22-patient admitted for gangrene of second toe on right lower extremity  Assessment and Plan: Ricky Hanson is a 69 y.o. male presenting with infection of right second toe. 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  He has continued on vancomycin, cefepime, Zosyn.  WBC improved at 13.4 from 14.9 yesterday patient is followed by Dr. Shella Spearing.  Patient underwent arteriogram with Dr. Jacinto Halim which showed all 3 vessels at proximal calf occluded.  Dr. Lajoyce Corners has also been consulted and feels that he will be able to heal a transtibial amputation but that any foot salvage is not possible given poor distal circulation.  BKA scheduled for 6/25. -Ortho following, appreciate recommendations -Cardiology following, appreciate recommendations -Continue IV vancomycin and cefepime until after BKA -Patient may have a diet at this time but n.p.o. at midnight for BKA tomorrow -Maintenance IV fluids while n.p.o. -Scheduled Tylenol 1000 mg every 8 hours -Continue 5 mg oxycodone every 4 hours  Peripheral vascular disease Dr. Jacinto Halim following and consulted for possible revascularization prior to amputation.  Cardiology also following. -Aspirin 81 mg and rivaroxaban per cardiology -Peripheral arteriogram showing occlusion of 3 vessels in proximal calf.  Hypertension Blood pressures have ranged from 127/63-1 44/62.  Medications include amlodipine 10 mg daily, lisinopril 10 mg daily.  Patient's cousin helps with his medications and  reports good adherence. -Continue home amlodipine and lisinopril -Vitals per protocol  Hyperlipidemia Lipid panel on 01/2020 shows elevated total cholesterol at 182, HDL 64, LDL 1 5.  Patient is currently on atorvastatin 10 mg daily. -Atorvastatin increased to 20 mg daily on admission  Prediabetes Patient's hemoglobin A1c in March 2021 was 5.5. -Sliding scale insulin -Heart healthy diabetic diet  AKI on CKD 3A Creatinine 1.81 on admission.  Creatinine is stable at 1.21 1 this morning -Continue IV hydration -Morning BMPs -Avoid nephrotoxic agents  Normocytic anemia Hemoglobin 8.1 today from 8.3 yesterday iron panel significant for low iron at 19, decreased total iron-binding capacity at 154, decreased saturation ratio and increased ferritin is consistent with iron deficiency anemia. -Consider Feraheme -Morning CBCs  Chronic indwelling Foley Patient denies any symptoms of UTI on admission.  Culture is still pending.  Patient has apparent soft tissue source.  Hx of presumed schizophrenia Records requested.  Patient on haloperidol per med rec.  Reports last injection was in May.  Spoke with patient's cousin today who said that [facility] had reached out to him about the patient receiving his next dose of haloperidol tomorrow 6/25. -Contact [facility] regarding dosage   FEN/GI: N.p.o. at this time PPx: Patient is on Xarelto with SCD on one leg  Disposition: Pending evaluation by vascular then possible amputation by orthopedics  Subjective:  Patient will answer very few questions for me this morning.  I asked how his pain is doing he says "it is okay".  He will not respond to other questions.  He was staring off but with prodding he will respond or look at you.  No acute distress at this time  Objective: Temp:  [97.6 F (36.4 C)-99 F (  37.2 C)] 98.5 F (36.9 C) (06/24 0753) Pulse Rate:  [55-69] 69 (06/24 0753) Resp:  [4-54] 13 (06/24 0753) BP: (98-142)/(49-69) 127/63 (06/24  0753) SpO2:  [0 %-100 %] 97 % (06/24 0753) Weight:  [84.5 kg] 84.5 kg (06/24 0444) Physical Exam: General: Sitting in bed, getting ready to have blood draw in another room Cardiovascular: Regular rate and rhythm, no murmurs appreciated Respiratory: Regular respiratory rate, normal work of breathing Abdomen: Soft, nontender, positive bowel sounds Extremities: Right lower extremity foot is wrapped in gauze, there is still marked edema and erythema and warmth of the right lower extremity at the ankle to the mid calf.  Laboratory: Recent Labs  Lab 05/16/20 1800 05/17/20 0856 05/18/20 0824  WBC 17.5* 14.9* 13.4*  HGB 9.1* 8.3* 8.1*  HCT 27.8* 25.0* 24.4*  PLT 319 287 306   Recent Labs  Lab 05/15/20 1559 05/15/20 1559 05/16/20 0844 05/17/20 0856 05/18/20 0824  NA 130*   < > 131* 137 136  K 4.5   < > 4.7 4.4 3.8  CL 99   < > 98 109 109  CO2 21*   < > 21* 19* 19*  BUN 15   < > 20 16 11   CREATININE 1.57*   < > 1.81* 1.24 1.21  CALCIUM 9.0   < > 9.1 8.4* 8.3*  PROT 7.6  --   --   --   --   BILITOT 0.8  --   --   --   --   ALKPHOS 140*  --   --   --   --   ALT 40  --   --   --   --   AST 24  --   --   --   --   GLUCOSE 159*   < > 179* 128* 124*   < > = values in this interval not displayed.   Results for DEAKON, FRIX (MRN [id number]) as of 05/17/2020 14:27  Ref. Range 05/17/2020 12:05  Total CHOL/HDL Ratio Latest Units: RATIO 3.1  Cholesterol Latest Ref Range: 0 - 200 mg/dL 114  HDL Cholesterol Latest Ref Range: >40 mg/dL 37 (L)  LDL (calc) Latest Ref Range: 0 - 99 mg/dL 71  Triglycerides Latest Ref Range: <150 mg/dL 31  VLDL Latest Ref Range: 0 - 40 mg/dL 6  Iron Latest Ref Range: 45 - 182 ug/dL 19 (L)  UIBC Latest Units: ug/dL 135  TIBC Latest Ref Range: 250 - 450 ug/dL 154 (L)  Saturation Ratios Latest Ref Range: 17.9 - 39.5 % 12 (L)  Ferritin Latest Ref Range: 24 - 336 ng/mL 625 (H)    Imaging/Diagnostic Tests: DG Chest 1 View  Result Date: 05/16/2020 CLINICAL  DATA:  Shortness of breath. EXAM: CHEST  1 VIEW COMPARISON:  None. FINDINGS: There is no evidence of acute infiltrate, pleural effusion or pneumothorax. The heart size and mediastinal contours are within normal limits. There is mild calcification of the aortic arch. The visualized skeletal structures are unremarkable. IMPRESSION: No active disease. Electronically Signed   By: Virgina Norfolk M.D.   On: 05/16/2020 16:36   PERIPHERAL VASCULAR CATHETERIZATION  Result Date: 05/17/2020  Prox R Popliteal to Mid R Popliteal lesion is 70% stenosed.  Mid R ATA to Dist R ATA lesion is 100% stenosed.  Prox R TP Trunk to Dist R TP Trunk lesion is 100% stenosed.  Ost R PTA to Prox R PTA lesion is 100% stenosed.  Mid R Peroneal to Dist R Peroneal lesion  is 100% stenosed.  Abdominal aortogram and limited bifemoral arteriogram and right femoral arteriogram with distal runoff 05/17/2020: Abdominal aorta is widely patent, 2 renal arteries 1 on either sides widely patent.  Minimal atherosclerotic changes noted.  Bilateral aortoiliac vessels are widely patent. Right femoral artery is widely patent.  Right superficial femoral artery has mild disease throughout.  Right distal popliteal artery has a 70 to 75% stenosis with brisk flow. Right anterior tibial artery occludes in the mid segment and reconstitutes just above the right ankle, extensive collaterals are noted from below. Right TP trunk is occluded.  Peroneal artery is diffusely diseased and is occluded in the proximal segment.  Posterior tibial artery reconstitutes at the level of the left mid calf.  The collateralized anterior tibial and posterior tibial arteries do form pedal arch with brisk flow. Interventional data: Unsuccessful attempt at both antegrade and retrograde recanalization of CTO right TP trunk and posterior tibial artery. Extensive collaterals are evident and we could certainly attempt transmetatarsal amputation and if the wound does not heal, we could  certainly reattempt revascularization of the right PT now that we know the anatomy and possibly right AT/DP.  Right AT appears to be difficult in view of extensive collateralization even with pedal access. 130 mL contrast utilized.  Patient tolerated the procedure well.  Access closed with minx on the left, with excellent hemostasis.  Manual pressure held for the right posterior tibial access.   DG Foot Complete Right  Result Date: 05/17/2020 Please see detailed radiograph report in office note.  ECHOCARDIOGRAM COMPLETE  Result Date: 05/17/2020    ECHOCARDIOGRAM REPORT   Patient Name:   LUCA DYAR Date of Exam: 05/17/2020 Medical Rec #:  [id number]      Height:       73.0 in Accession #:    [id number]     Weight:       200.0 lb Date of Birth:  01-Dec-1950      BSA:          2.152 m Patient Age:    68 years       BP:           100/52 mmHg Patient Gender: M              HR:           65 bpm. Exam Location:  Inpatient Procedure: 2D Echo, Cardiac Doppler and Color Doppler Indications:    R01.1 Murmur  History:        Patient has no prior history of Echocardiogram examinations.  Sonographer:    Tiffany Dance Referring Phys: [id number] CARINA M BROWN IMPRESSIONS  1. Left ventricular ejection fraction, by estimation, is 65 to 70%. The left ventricle has normal function. The left ventricle has no regional wall motion abnormalities. There is mild asymmetric left ventricular hypertrophy. Left ventricular diastolic parameters were normal.  2. Right ventricular systolic function is normal. The right ventricular size is normal.  3. The mitral valve is normal in structure. Trivial mitral valve regurgitation. No evidence of mitral stenosis.  4. The aortic valve is normal in structure. Aortic valve regurgitation is not visualized. No aortic stenosis is present. FINDINGS  Left Ventricle: Left ventricular ejection fraction, by estimation, is 65 to 70%. The left ventricle has normal function. The left ventricle has no regional  wall motion abnormalities. The left ventricular internal cavity size was normal in size. There is  mild asymmetric left ventricular hypertrophy. Left ventricular diastolic parameters were normal. Right Ventricle:  The right ventricular size is normal. No increase in right ventricular wall thickness. Right ventricular systolic function is normal. Left Atrium: Left atrial size was normal in size. Right Atrium: Right atrial size was normal in size. Pericardium: There is no evidence of pericardial effusion. Mitral Valve: The mitral valve is normal in structure. Trivial mitral valve regurgitation. No evidence of mitral valve stenosis. Tricuspid Valve: The tricuspid valve is normal in structure. Tricuspid valve regurgitation is not demonstrated. No evidence of tricuspid stenosis. Aortic Valve: The aortic valve is normal in structure. Aortic valve regurgitation is not visualized. No aortic stenosis is present. Pulmonic Valve: The pulmonic valve was normal in structure. Pulmonic valve regurgitation is not visualized. No evidence of pulmonic stenosis. Aorta: The aortic root and ascending aorta are structurally normal, with no evidence of dilitation. IAS/Shunts: The atrial septum is grossly normal.  LEFT VENTRICLE PLAX 2D LVIDd:         4.20 cm  Diastology LVIDs:         2.60 cm  LV e' lateral:   8.49 cm/s LV PW:         1.30 cm  LV E/e' lateral: 13.3 LV IVS:        1.10 cm  LV e' medial:    6.53 cm/s LVOT diam:     2.30 cm  LV E/e' medial:  17.3 LV SV:         98 LV SV Index:   46 LVOT Area:     4.15 cm  RIGHT VENTRICLE             IVC RV Basal diam:  2.20 cm     IVC diam: 1.60 cm RV S prime:     11.85 cm/s TAPSE (M-mode): 2.3 cm LEFT ATRIUM             Index       RIGHT ATRIUM           Index LA diam:        3.40 cm 1.58 cm/m  RA Area:     12.30 cm LA Vol (A2C):   72.1 ml 33.51 ml/m RA Volume:   24.40 ml  11.34 ml/m LA Vol (A4C):   39.6 ml 18.40 ml/m LA Biplane Vol: 55.3 ml 25.70 ml/m  AORTIC VALVE LVOT Vmax:   107.00  cm/s LVOT Vmean:  66.200 cm/s LVOT VTI:    0.237 m  AORTA Ao Root diam: 3.80 cm Ao Asc diam:  3.60 cm MITRAL VALVE MV Area (PHT): 3.65 cm     SHUNTS MV Decel Time: 208 msec     Systemic VTI:  0.24 m MV E velocity: 113.00 cm/s  Systemic Diam: 2.30 cm MV A velocity: 118.00 cm/s MV E/A ratio:  0.96 Kristeen Miss MD Electronically signed by Kristeen Miss MD Signature Date/Time: 05/17/2020/12:58:52 PM    Final      Derrel Nip, MD 05/18/2020, 11:47 AM PGY-1, [facility] FPTS Intern pager: [phone], text pages welcome

## 2020-05-19 ENCOUNTER — Inpatient Hospital Stay (HOSPITAL_COMMUNITY): Payer: Medicare Other | Admitting: Certified Registered"

## 2020-05-19 ENCOUNTER — Encounter (HOSPITAL_COMMUNITY)
Admission: AD | Disposition: A | Discharge status: Skilled Nursing Facility | Payer: Self-pay | Source: Ambulatory Visit | Attending: Family Medicine

## 2020-05-19 ENCOUNTER — Encounter (HOSPITAL_COMMUNITY): Payer: Self-pay | Admitting: Family Medicine

## 2020-05-19 HISTORY — PX: AMPUTATION: SHX166

## 2020-05-19 LAB — GLUCOSE, CAPILLARY
Glucose-Capillary: 183 mg/dL — ABNORMAL HIGH (ref 70–99)
Glucose-Capillary: 278 mg/dL — ABNORMAL HIGH (ref 70–99)
Glucose-Capillary: 152 mg/dL — ABNORMAL HIGH (ref 70–99)
Glucose-Capillary: 159 mg/dL — ABNORMAL HIGH (ref 70–99)
Glucose-Capillary: 150 mg/dL — ABNORMAL HIGH (ref 70–99)

## 2020-05-19 LAB — CBC
HCT: 25.9 % — ABNORMAL LOW (ref 39.0–52.0)
Hemoglobin: 8.6 g/dL — ABNORMAL LOW (ref 13.0–17.0)
MCH: 27.7 pg (ref 26.0–34.0)
MCHC: 33.2 g/dL (ref 30.0–36.0)
MCV: 83.3 fL (ref 80.0–100.0)
Platelets: 349 10*3/uL (ref 150–400)
RBC: 3.11 MIL/uL — ABNORMAL LOW (ref 4.22–5.81)
RDW: 13.9 % (ref 11.5–15.5)
WBC: 15.7 10*3/uL — ABNORMAL HIGH (ref 4.0–10.5)
nRBC: 0 % (ref 0.0–0.2)

## 2020-05-19 LAB — BASIC METABOLIC PANEL
Anion gap: 9 (ref 5–15)
BUN: 5 mg/dL — ABNORMAL LOW (ref 8–23)
CO2: 20 mmol/L — ABNORMAL LOW (ref 22–32)
Calcium: 8.7 mg/dL — ABNORMAL LOW (ref 8.9–10.3)
Chloride: 105 mmol/L (ref 98–111)
Creatinine, Ser: 1.17 mg/dL (ref 0.61–1.24)
GFR calc Af Amer: >60 mL/min (ref >60)
GFR calc non Af Amer: >60 mL/min (ref >60)
Glucose, Bld: 138 mg/dL — ABNORMAL HIGH (ref 70–99)
Potassium: 3.7 mmol/L (ref 3.5–5.1)
Sodium: 134 mmol/L — ABNORMAL LOW (ref 135–145)

## 2020-05-19 SURGERY — AMPUTATION BELOW KNEE
Anesthesia: General | Site: Knee | Laterality: Right

## 2020-05-19 MED ORDER — LACTATED RINGERS IV SOLN: INTRAVENOUS | Status: DC

## 2020-05-19 MED ORDER — HALOPERIDOL DECANOATE 100 MG/ML IM SOLN
100.0000 mg | Freq: Once | INTRAMUSCULAR | Status: AC
  Administered 2020-05-19: 100 mg via INTRAMUSCULAR
  Filled 2020-05-19 (×2): qty 1

## 2020-05-19 MED ORDER — FENTANYL CITRATE (PF) 100 MCG/2ML IJ SOLN: 25.0000 ug | INTRAMUSCULAR | Status: DC | PRN

## 2020-05-19 MED ORDER — METOCLOPRAMIDE HCL 5 MG PO TABS: 5.0000 mg | ORAL_TABLET | Freq: Three times a day (TID) | ORAL | Status: DC | PRN

## 2020-05-19 MED ORDER — EPHEDRINE SULFATE-NACL 50-0.9 MG/10ML-% IV SOSY
PREFILLED_SYRINGE | INTRAVENOUS | Status: DC | PRN
  Administered 2020-05-19: 10 mg via INTRAVENOUS
  Administered 2020-05-19: 5 mg via INTRAVENOUS

## 2020-05-19 MED ORDER — OXYCODONE HCL 5 MG PO TABS: 5.0000 mg | ORAL_TABLET | Freq: Once | ORAL | Status: DC | PRN

## 2020-05-19 MED ORDER — FENTANYL CITRATE (PF) 100 MCG/2ML IJ SOLN
INTRAMUSCULAR | Status: AC
  Administered 2020-05-19: 50 ug via INTRAVENOUS
  Filled 2020-05-19: qty 2

## 2020-05-19 MED ORDER — ACETAMINOPHEN 325 MG PO TABS: 325.0000 mg | ORAL_TABLET | Freq: Four times a day (QID) | ORAL | Status: DC | PRN

## 2020-05-19 MED ORDER — INSULIN ASPART 100 UNIT/ML ~~LOC~~ SOLN
8.0000 [IU] | Freq: Once | SUBCUTANEOUS | Status: AC
  Filled 2020-05-19: qty 0.08

## 2020-05-19 MED ORDER — INSULIN ASPART 100 UNIT/ML ~~LOC~~ SOLN
SUBCUTANEOUS | Status: AC
  Administered 2020-05-19: 8 [IU] via SUBCUTANEOUS
  Filled 2020-05-19: qty 1

## 2020-05-19 MED ORDER — SODIUM CHLORIDE 0.9 % IV SOLN: INTRAVENOUS | Status: DC

## 2020-05-19 MED ORDER — ENSURE PRE-SURGERY PO LIQD
296.0000 mL | Freq: Once | ORAL | Status: AC
  Administered 2020-05-19: 296 mL via ORAL
  Filled 2020-05-19: qty 296

## 2020-05-19 MED ORDER — MIDAZOLAM HCL 2 MG/2ML IJ SOLN
INTRAMUSCULAR | Status: AC
  Filled 2020-05-19: qty 2

## 2020-05-19 MED ORDER — EPHEDRINE 5 MG/ML INJ
INTRAVENOUS | Status: AC
  Filled 2020-05-19: qty 10

## 2020-05-19 MED ORDER — HYDROMORPHONE HCL 1 MG/ML IJ SOLN: 0.5000 mg | INTRAMUSCULAR | Status: DC | PRN

## 2020-05-19 MED ORDER — SODIUM CHLORIDE 0.9 % IR SOLN
Status: DC | PRN
  Administered 2020-05-19: 1000 mL

## 2020-05-19 MED ORDER — DOCUSATE SODIUM 100 MG PO CAPS
100.0000 mg | ORAL_CAPSULE | Freq: Two times a day (BID) | ORAL | Status: DC
  Administered 2020-05-19 – 2020-05-23 (×8): 100 mg via ORAL
  Filled 2020-05-19 (×8): qty 1

## 2020-05-19 MED ORDER — PROPOFOL 10 MG/ML IV BOLUS
INTRAVENOUS | Status: AC
  Filled 2020-05-19: qty 20

## 2020-05-19 MED ORDER — PHENYLEPHRINE 40 MCG/ML (10ML) SYRINGE FOR IV PUSH (FOR BLOOD PRESSURE SUPPORT)
PREFILLED_SYRINGE | INTRAVENOUS | Status: AC
  Filled 2020-05-19: qty 10

## 2020-05-19 MED ORDER — PHENYLEPHRINE 40 MCG/ML (10ML) SYRINGE FOR IV PUSH (FOR BLOOD PRESSURE SUPPORT)
PREFILLED_SYRINGE | INTRAVENOUS | Status: AC
  Filled 2020-05-19: qty 20

## 2020-05-19 MED ORDER — METOCLOPRAMIDE HCL 5 MG/ML IJ SOLN: 5.0000 mg | Freq: Three times a day (TID) | INTRAMUSCULAR | Status: DC | PRN

## 2020-05-19 MED ORDER — FENTANYL CITRATE (PF) 100 MCG/2ML IJ SOLN: 50.0000 ug | Freq: Once | INTRAMUSCULAR | Status: AC

## 2020-05-19 MED ORDER — BUPIVACAINE LIPOSOME 1.3 % IJ SUSP
INTRAMUSCULAR | Status: DC | PRN
  Administered 2020-05-19 (×2): 5 mL via PERINEURAL

## 2020-05-19 MED ORDER — CHLORHEXIDINE GLUCONATE 0.12 % MT SOLN
15.0000 mL | Freq: Once | OROMUCOSAL | Status: AC
  Administered 2020-05-19: 15 mL via OROMUCOSAL

## 2020-05-19 MED ORDER — PROMETHAZINE HCL 25 MG/ML IJ SOLN: 6.2500 mg | INTRAMUSCULAR | Status: DC | PRN

## 2020-05-19 MED ORDER — BUPIVACAINE-EPINEPHRINE (PF) 0.5% -1:200000 IJ SOLN
INTRAMUSCULAR | Status: DC | PRN
  Administered 2020-05-19: 10 mL via PERINEURAL
  Administered 2020-05-19: 20 mL via PERINEURAL

## 2020-05-19 MED ORDER — OXYCODONE HCL 5 MG/5ML PO SOLN: 5.0000 mg | Freq: Once | ORAL | Status: DC | PRN

## 2020-05-19 MED ORDER — PHENYLEPHRINE 40 MCG/ML (10ML) SYRINGE FOR IV PUSH (FOR BLOOD PRESSURE SUPPORT)
PREFILLED_SYRINGE | INTRAVENOUS | Status: DC | PRN
  Administered 2020-05-19 (×3): 80 ug via INTRAVENOUS

## 2020-05-19 MED ORDER — SUCCINYLCHOLINE CHLORIDE 200 MG/10ML IV SOSY
PREFILLED_SYRINGE | INTRAVENOUS | Status: DC | PRN
  Administered 2020-05-19: 100 mg via INTRAVENOUS

## 2020-05-19 MED ORDER — LIDOCAINE 2% (20 MG/ML) 5 ML SYRINGE
INTRAMUSCULAR | Status: DC | PRN
  Administered 2020-05-19: 100 mg via INTRAVENOUS

## 2020-05-19 MED ORDER — ORAL CARE MOUTH RINSE: 15.0000 mL | Freq: Once | OROMUCOSAL | Status: AC

## 2020-05-19 MED ORDER — ONDANSETRON HCL 4 MG/2ML IJ SOLN
INTRAMUSCULAR | Status: DC | PRN
  Administered 2020-05-19: 4 mg via INTRAVENOUS

## 2020-05-19 MED ORDER — PROPOFOL 10 MG/ML IV BOLUS
INTRAVENOUS | Status: DC | PRN
  Administered 2020-05-19: 40 mg via INTRAVENOUS
  Administered 2020-05-19: 50 mg via INTRAVENOUS
  Administered 2020-05-19: 160 mg via INTRAVENOUS
  Administered 2020-05-19: 40 mg via INTRAVENOUS
  Administered 2020-05-19: 50 mg via INTRAVENOUS

## 2020-05-19 SURGICAL SUPPLY — 39 items
BLADE SAW RECIP 87.9 MT (BLADE) ×2 IMPLANT
BLADE SURG 21 STRL SS (BLADE) ×2 IMPLANT
BNDG COHESIVE 6X5 TAN STRL LF (GAUZE/BANDAGES/DRESSINGS) IMPLANT
CANISTER WOUND CARE 500ML ATS (WOUND CARE) ×2 IMPLANT
COVER SURGICAL LIGHT HANDLE (MISCELLANEOUS) ×2 IMPLANT
COVER WAND RF STERILE (DRAPES) IMPLANT
CUFF TOURN SGL QUICK 34 (TOURNIQUET CUFF) ×1
CUFF TRNQT CYL 34X4.125X (TOURNIQUET CUFF) ×1 IMPLANT
DRAPE INCISE IOBAN 66X45 STRL (DRAPES) ×2 IMPLANT
DRAPE U-SHAPE 47X51 STRL (DRAPES) ×2 IMPLANT
DRESSING PREVENA PLUS CUSTOM (GAUZE/BANDAGES/DRESSINGS) ×1 IMPLANT
DRSG PREVENA PLUS CUSTOM (GAUZE/BANDAGES/DRESSINGS) ×2
DURAPREP 26ML APPLICATOR (WOUND CARE) ×2 IMPLANT
ELECT REM PT RETURN 9FT ADLT (ELECTROSURGICAL) ×2
ELECTRODE REM PT RTRN 9FT ADLT (ELECTROSURGICAL) ×1 IMPLANT
GLOVE BIOGEL PI IND STRL 9 (GLOVE) ×1 IMPLANT
GLOVE BIOGEL PI INDICATOR 9 (GLOVE) ×1
GLOVE SURG ORTHO 9.0 STRL STRW (GLOVE) ×2 IMPLANT
GOWN STRL REUS W/ TWL XL LVL3 (GOWN DISPOSABLE) ×2 IMPLANT
GOWN STRL REUS W/TWL XL LVL3 (GOWN DISPOSABLE) ×2
KIT BASIN OR (CUSTOM PROCEDURE TRAY) ×2 IMPLANT
KIT TURNOVER KIT B (KITS) ×2 IMPLANT
MANIFOLD NEPTUNE II (INSTRUMENTS) ×2 IMPLANT
NS IRRIG 1000ML POUR BTL (IV SOLUTION) ×2 IMPLANT
PACK ORTHO EXTREMITY (CUSTOM PROCEDURE TRAY) ×2 IMPLANT
PAD ARMBOARD 7.5X6 YLW CONV (MISCELLANEOUS) ×1 IMPLANT
PREVENA RESTOR ARTHOFORM 46X30 (CANNISTER) ×2 IMPLANT
SPONGE LAP 18X18 RF (DISPOSABLE) ×1 IMPLANT
STAPLER VISISTAT 35W (STAPLE) ×1 IMPLANT
STOCKINETTE IMPERVIOUS LG (DRAPES) ×2 IMPLANT
SUT ETHILON 2 0 PSLX (SUTURE) ×1 IMPLANT
SUT SILK 2 0 (SUTURE) ×1
SUT SILK 2-0 18XBRD TIE 12 (SUTURE) ×1 IMPLANT
SUT VIC AB 1 CTX 27 (SUTURE) ×4 IMPLANT
SUT VIC AB 1 CTX 36 (SUTURE) ×2
SUT VIC AB 1 CTX36XBRD ANBCTR (SUTURE) IMPLANT
TOWEL GREEN STERILE (TOWEL DISPOSABLE) ×2 IMPLANT
TUBE CONNECTING 12X1/4 (SUCTIONS) ×2 IMPLANT
YANKAUER SUCT BULB TIP NO VENT (SUCTIONS) ×2 IMPLANT

## 2020-05-19 NOTE — Anesthesia Procedure Notes (Signed)
Anesthesia Regional Block: Popliteal block   Pre-Anesthetic Checklist: ,, timeout performed, Correct Patient, Correct Site, Correct Laterality, Correct Procedure, Correct Position, site marked, Risks and benefits discussed, pre-op evaluation,  At surgeon's request and post-op pain management  Laterality: Right  Prep: Maximum Sterile Barrier Precautions used, chloraprep       Needles:  Injection technique: Single-shot  Needle Type: Echogenic Stimulator Needle     Needle Length: 9cm  Needle Gauge: 22     Additional Needles:   Procedures:,,,, ultrasound used (permanent image in chart),,,,  Narrative:  Start time: 05/19/2020 10:56 AM End time: 05/19/2020 10:58 AM Injection made incrementally with aspirations every 5 mL.  Performed by: Personally  Anesthesiologist: Kaylyn Layer, MD  Additional Notes: Risks, benefits, and alternative discussed. Patient gave consent for procedure. Patient prepped and draped in sterile fashion. Sedation administered, patient remains easily responsive to voice. Relevant anatomy identified with ultrasound guidance. Local anesthetic given in 5cc increments with no signs or symptoms of intravascular injection. No pain or paraesthesias with injection. Patient monitored throughout procedure with signs of LAST or immediate complications. Tolerated well. Ultrasound image placed in chart.  Ricky Greenhouse, MD

## 2020-05-19 NOTE — Progress Notes (Signed)
Surgical notes reviewed.  I can f/u in the OP basis for CV risk modification once discharged.   Yates Decamp, MD, [facility] 05/19/2020, 4:19 PM Office: [phone]

## 2020-05-19 NOTE — Anesthesia Postprocedure Evaluation (Signed)
Anesthesia Post Note  Patient: Ricky Hanson  Procedure(s) Performed: RIGHT BELOW KNEE AMPUTATION (Right Knee)     Patient location during evaluation: PACU Anesthesia Type: General Level of consciousness: awake and alert and oriented Pain management: pain level controlled Vital Signs Assessment: post-procedure vital signs reviewed and stable Respiratory status: spontaneous breathing, nonlabored ventilation and respiratory function stable Cardiovascular status: blood pressure returned to baseline Postop Assessment: no apparent nausea or vomiting Anesthetic complications: no   No complications documented.  Last Vitals:  Vitals:   05/19/20 1330 05/19/20 1345  BP: 116/70   Pulse: 74   Resp: 19   Temp:  (!) 36.1 C  SpO2: 100%     Last Pain:  Vitals:   05/19/20 1345  TempSrc:   PainSc: 0-No pain                 Kaylyn Layer

## 2020-05-19 NOTE — Anesthesia Procedure Notes (Signed)
Anesthesia Regional Block: Adductor canal block   Pre-Anesthetic Checklist: ,, timeout performed, Correct Patient, Correct Site, Correct Laterality, Correct Procedure, Correct Position, site marked, Risks and benefits discussed, pre-op evaluation,  At surgeon's request and post-op pain management  Laterality: Right  Prep: Maximum Sterile Barrier Precautions used, chloraprep       Needles:  Injection technique: Single-shot  Needle Type: Echogenic Stimulator Needle     Needle Length: 9cm  Needle Gauge: 22     Additional Needles:   Procedures:,,,, ultrasound used (permanent image in chart),,,,  Narrative:  Start time: 05/19/2020 10:53 AM End time: 05/19/2020 10:55 AM Injection made incrementally with aspirations every 5 mL.  Performed by: Personally  Anesthesiologist: Kaylyn Layer, MD  Additional Notes: Risks, benefits, and alternative discussed. Patient gave consent for procedure. Patient prepped and draped in sterile fashion. Sedation administered, patient remains easily responsive to voice. Relevant anatomy identified with ultrasound guidance. Local anesthetic given in 5cc increments with no signs or symptoms of intravascular injection. No pain or paraesthesias with injection. Patient monitored throughout procedure with signs of LAST or immediate complications. Tolerated well. Ultrasound image placed in chart.  Amalia Greenhouse, MD

## 2020-05-19 NOTE — Anesthesia Procedure Notes (Signed)
Procedure Name: Intubation Date/Time: 05/19/2020 12:28 PM Performed by: Marny Lowenstein, CRNA Pre-anesthesia Checklist: Patient identified, Emergency Drugs available, Suction available and Patient being monitored Patient Re-evaluated:Patient Re-evaluated prior to induction Oxygen Delivery Method: Circle system utilized Preoxygenation: Pre-oxygenation with 100% oxygen Induction Type: IV induction Ventilation: Mask ventilation without difficulty and Oral airway inserted - appropriate to patient size Laryngoscope Size: Hyacinth Meeker and 2 Grade View: Grade I Tube type: Oral Tube size: 7.0 mm Number of attempts: 1 Airway Equipment and Method: Stylet Placement Confirmation: ETT inserted through vocal cords under direct vision,  positive ETCO2 and breath sounds checked- equal and bilateral Secured at: 21 cm Tube secured with: Tape Dental Injury: Teeth and Oropharynx as per pre-operative assessment

## 2020-05-19 NOTE — Op Note (Signed)
   Date of Surgery: 05/19/2020  INDICATIONS: Ricky Hanson is a 69 y.o.-year-old male who has dry gangrene of the right foot with osteomyelitis.  Revascularization was attempted and patient did not have any revascularization options with complete occlusion of all 3 vessels distal to the popliteal fossa.  Recommendation was to proceed with a transtibial amputation discussed that patient is still an increased risk of the wound not healing.  This was discussed with the patient and his son who is the power of attorney they both state they understand and wish to proceed with surgery.Marland Kitchen  PREOPERATIVE DIAGNOSIS: Gangrene right foot with complete occlusion of all 3 vessels to the foot  POSTOPERATIVE DIAGNOSIS: Same.  PROCEDURE: Transtibial amputation Application of Prevena wound VAC  SURGEON: Lajoyce Corners, M.D.  ANESTHESIA:  general  IV FLUIDS AND URINE: See anesthesia.  ESTIMATED BLOOD LOSS: Minimal mL.  COMPLICATIONS: None.  DESCRIPTION OF PROCEDURE: The patient was brought to the operating room and underwent a general anesthetic. After adequate levels of anesthesia were obtained patient's lower extremity was prepped using DuraPrep draped into a sterile field. A timeout was called. The foot was draped out of the sterile field with impervious stockinette. A transverse incision was made 11 cm distal to the tibial tubercle. This curved proximally and a large posterior flap was created. The tibia was transected 1 cm proximal to the skin incision. The fibula was transected just proximal to the tibial incision. The tibia was beveled anteriorly. A large posterior flap was created. The sciatic nerve was pulled cut and allowed to retract. The vascular bundles were suture ligated with 2-0 silk. The deep and superficial fascial layers were closed using #1 Vicryl. The skin was closed using staples and 2-0 nylon. The wound was covered with a Prevena wound VAC. There was a good suction fit. A prosthetic shrinker was applied.  Patient was extubated taken to the PACU in stable condition.   DISCHARGE PLANNING:  Antibiotic duration: 24 hours  Weightbearing: Nonweightbearing on the right  Pain medication: Opioid pathway  Dressing care/ Wound VAC: Wound VAC for 1 week  Discharge to: Home.  Medial gastrocnemius muscle was gray without contractility.  Follow-up: In the office 1 week post operative.  Ricky Baker, MD [facility] 1:10 PM

## 2020-05-19 NOTE — Anesthesia Preprocedure Evaluation (Addendum)
Anesthesia Evaluation  Patient identified by MRN, date of birth, ID band Patient awake    Reviewed: Allergy & Precautions, NPO status , Patient's Chart, lab work & pertinent test results  History of Anesthesia Complications Negative for: history of anesthetic complications  Airway Mallampati: II  TM Distance: >3 FB Neck ROM: Full    Dental  (+) Poor Dentition   Pulmonary Current Smoker,    Pulmonary exam normal        Cardiovascular hypertension, Pt. on medications + Peripheral Vascular Disease  Normal cardiovascular exam     Neuro/Psych Schizophrenia negative neurological ROS     GI/Hepatic negative GI ROS, Neg liver ROS,   Endo/Other  diabetes, Type 2  Renal/GU Renal InsufficiencyRenal disease  negative genitourinary   Musculoskeletal Sepsis due to wet gangrene and osteomyelitis of right foot   Abdominal   Peds  Hematology  (+) anemia , Hgb 8.6   Anesthesia Other Findings Day of surgery medications reviewed with patient.  Reproductive/Obstetrics negative OB ROS                            Anesthesia Physical Anesthesia Plan  ASA: III  Anesthesia Plan: General   Post-op Pain Management: GA combined w/ Regional for post-op pain   Induction: Intravenous  PONV Risk Score and Plan: 2 and Treatment may vary due to age or medical condition, Ondansetron and Midazolam  Airway Management Planned: LMA  Additional Equipment: None  Intra-op Plan:   Post-operative Plan: Extubation in OR  Informed Consent: I have reviewed the patients History and Physical, chart, labs and discussed the procedure including the risks, benefits and alternatives for the proposed anesthesia with the patient or authorized representative who has indicated his/her understanding and acceptance.     Dental advisory given  Plan Discussed with: CRNA  Anesthesia Plan Comments:        Anesthesia Quick  Evaluation

## 2020-05-19 NOTE — Interval H&P Note (Signed)
History and Physical Interval Note:  05/19/2020 6:42 AM  Ricky Hanson  has presented today for surgery, with the diagnosis of Gangrene Right Foot.  The various methods of treatment have been discussed with the patient and family. After consideration of risks, benefits and other options for treatment, the patient has consented to  Procedure(s): RIGHT BELOW KNEE AMPUTATION (Right) as a surgical intervention.  The patient's history has been reviewed, patient examined, no change in status, stable for surgery.  I have reviewed the patient's chart and labs.  Questions were answered to the patient's satisfaction.     Nadara Mustard

## 2020-05-19 NOTE — Progress Notes (Signed)
Family Medicine Teaching Service Daily Progress Note Intern Pager: [phone]  Patient name: Ricky Hanson Medical record number: [id number] Date of birth: August 24, 1951 Age: 69 y.o. Gender: male  Primary Care Provider: Charlott Rakes, MD Consultants: Orthopedics, vascular, cardiology Code Status: Full code  Pt Overview and Major Events to Date:  6/22-patient admitted for gangrene of second toe on right lower extremity  Assessment and Plan: Ricky Hanson is a 69 y.o. male presenting with infection of right second toe. 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  Responds yes and no to questions.  Reports his pain is being well managed.  The longest sentence he said to me throughout the entire interaction was "can you turn off the lights".  He has continued on vancomycin, cefepime, Zosyn.  WBC elevated at 15.7 from 14 yesterday.  Patient is followed by Dr. Carman Ching.  Patient underwent arteriogram with Dr. Einar Gip which showed all 3 vessels at proximal calf occluded.  Dr. Sharol Given has also been consulted and feels that he will be able to heal a transtibial amputation but that any foot salvage is not possible given poor distal circulation.  Patient scheduled for BKA today. -Ortho following, appreciate recommendations -Cardiology following, appreciate recommendations -Continue IV vancomycin and cefepime until after BKA, follow-up on surgery's recommendations for antibiotic duration -Patient n.p.o. for procedure but may have diet after -Maintenance IV fluids while n.p.o. -Scheduled Tylenol 1000 mg every 8 hours -Continue 5 mg oxycodone every 4 hours  Peripheral vascular disease Dr. Einar Gip following and consulted for possible revascularization prior to amputation.  Cardiology also following.  -Aspirin 81 mg and rivaroxaban per cardiology -Peripheral arteriogram showing occlusion of 3 vessels in proximal  calf. -Recommend BKA given poor likelihood of healing  Hypertension Blood pressures have ranged from 127/63-1 44/62.  Medications include amlodipine 10 mg daily, lisinopril 10 mg daily.  Patient's cousin helps with his medications and reports good adherence. -Continue home amlodipine and lisinopril -Vitals per protocol  Hyperlipidemia Lipid panel on 01/2020 shows elevated total cholesterol at 182, HDL 64, LDL 1 5.  Patient is currently on atorvastatin 10 mg daily. -Atorvastatin increased to 20 mg daily on admission  Prediabetes Patient's hemoglobin A1c in March 2021 was 5.5. -Sliding scale insulin -Heart healthy diabetic diet  AKI on CKD 3A Creatinine 1.81 on admission.  Creatinine is stable at 1.21 1 this morning -Continue IV hydration -Morning BMPs -Avoid nephrotoxic agents  Normocytic anemia Hemoglobin 8.6 today from 8.1 yesterday iron panel significant for low iron at 19, decreased total iron-binding capacity at 154, decreased saturation ratio and increased ferritin is consistent with iron deficiency anemia. -Consider Feraheme -Morning CBCs  Chronic indwelling Foley Patient denies any symptoms of UTI on admission.  Culture is still pending.  Patient has apparent soft tissue source.  Hx of presumed schizophrenia Records requested.  Patient on haloperidol per med rec.  Reports last injection was in May.  Beverly Sessions was contacted who reported that his next dose was due today 6/25.  Pharmacy has ordered dose for after his procedure -Patient should receive dose of haloperidol 100 mg after procedure   FEN/GI: N.p.o. at this time PPx: Patient is on Xarelto with SCD on one leg  Disposition: Pending BKA by orthopedics  Subjective:  Patient with very flat affect today.  He spends a lot of time staring at the wall but will respond if prodded.  Patient reports pain is controlled at this  time.  Scheduled for BKA today  Objective: Temp:  [98 F (36.7 C)-99.3 F (37.4 C)] 98 F (36.7  C) (06/24 2338) Pulse Rate:  [69-77] 71 (06/24 2354) Resp:  [13-17] 17 (06/24 2338) BP: (121-144)/(53-63) 121/56 (06/24 2338) SpO2:  [93 %-100 %] 95 % (06/24 2354) Physical Exam: General: Lying in bed in no acute distress Cardiovascular: Regular rate and rhythm Respiratory: Normal work of breathing with regular respiratory rate  Abdomen: Soft, nontender, positive bowel sounds Extremities: Right lower extremity foot is wrapped in gauze, considerable warmth and erythema in right lower extremity compared to left, reports tenderness when palpating right lower extremity Psych: Very flat affect  Laboratory: Recent Labs  Lab 05/16/20 1800 05/17/20 0856 05/18/20 0824  WBC 17.5* 14.9* 13.4*  HGB 9.1* 8.3* 8.1*  HCT 27.8* 25.0* 24.4*  PLT 319 287 306   Recent Labs  Lab 05/15/20 1559 05/15/20 1559 05/16/20 0844 05/17/20 0856 05/18/20 0824  NA 130*   < > 131* 137 136  K 4.5   < > 4.7 4.4 3.8  CL 99   < > 98 109 109  CO2 21*   < > 21* 19* 19*  BUN 15   < > 20 16 11   CREATININE 1.57*   < > 1.81* 1.24 1.21  CALCIUM 9.0   < > 9.1 8.4* 8.3*  PROT 7.6  --   --   --   --   BILITOT 0.8  --   --   --   --   ALKPHOS 140*  --   --   --   --   ALT 40  --   --   --   --   AST 24  --   --   --   --   GLUCOSE 159*   < > 179* 128* 124*   < > = values in this interval not displayed.   Results for DAMONIE, ELLENWOOD (MRN Philip Aspen) as of 05/17/2020 14:27  Ref. Range 05/17/2020 12:05  Total CHOL/HDL Ratio Latest Units: RATIO 3.1  Cholesterol Latest Ref Range: 0 - 200 mg/dL 05/19/2020  HDL Cholesterol Latest Ref Range: >40 mg/dL 37 (L)  LDL (calc) Latest Ref Range: 0 - 99 mg/dL 71  Triglycerides Latest Ref Range: <150 mg/dL 31  VLDL Latest Ref Range: 0 - 40 mg/dL 6  Iron Latest Ref Range: 45 - 182 ug/dL 19 (L)  UIBC Latest Units: ug/dL 626  TIBC Latest Ref Range: 250 - 450 ug/dL 948 (L)  Saturation Ratios Latest Ref Range: 17.9 - 39.5 % 12 (L)  Ferritin Latest Ref Range: 24 - 336 ng/mL 625 (H)     Imaging/Diagnostic Tests: PERIPHERAL VASCULAR CATHETERIZATION  Result Date: 05/17/2020  Prox R Popliteal to Mid R Popliteal lesion is 70% stenosed.  Mid R ATA to Dist R ATA lesion is 100% stenosed.  Prox R TP Trunk to Dist R TP Trunk lesion is 100% stenosed.  Ost R PTA to Prox R PTA lesion is 100% stenosed.  Mid R Peroneal to Dist R Peroneal lesion is 100% stenosed.  Abdominal aortogram and limited bifemoral arteriogram and right femoral arteriogram with distal runoff 05/17/2020: Abdominal aorta is widely patent, 2 renal arteries 1 on either sides widely patent.  Minimal atherosclerotic changes noted.  Bilateral aortoiliac vessels are widely patent. Right femoral artery is widely patent.  Right superficial femoral artery has mild disease throughout.  Right distal popliteal artery has a 70 to 75% stenosis with brisk flow. Right anterior tibial  artery occludes in the mid segment and reconstitutes just above the right ankle, extensive collaterals are noted from below. Right TP trunk is occluded.  Peroneal artery is diffusely diseased and is occluded in the proximal segment.  Posterior tibial artery reconstitutes at the level of the left mid calf.  The collateralized anterior tibial and posterior tibial arteries do form pedal arch with brisk flow. Interventional data: Unsuccessful attempt at both antegrade and retrograde recanalization of CTO right TP trunk and posterior tibial artery. Extensive collaterals are evident and we could certainly attempt transmetatarsal amputation and if the wound does not heal, we could certainly reattempt revascularization of the right PT now that we know the anatomy and possibly right AT/DP.  Right AT appears to be difficult in view of extensive collateralization even with pedal access. 130 mL contrast utilized.  Patient tolerated the procedure well.  Access closed with minx on the left, with excellent hemostasis.  Manual pressure held for the right posterior tibial access.    DG Foot Complete Right  Result Date: 05/17/2020 Please see detailed radiograph report in office note.  ECHOCARDIOGRAM COMPLETE  Result Date: 05/17/2020    ECHOCARDIOGRAM REPORT   Patient Name:   LILY KERNEN Date of Exam: 05/17/2020 Medical Rec #:  [id number]      Height:       73.0 in Accession #:    [id number]     Weight:       200.0 lb Date of Birth:  [birth year]      BSA:          2.152 m Patient Age:    68 years       BP:           100/52 mmHg Patient Gender: M              HR:           65 bpm. Exam Location:  Inpatient Procedure: 2D Echo, Cardiac Doppler and Color Doppler Indications:    R01.1 Murmur  History:        Patient has no prior history of Echocardiogram examinations.  Sonographer:    Tiffany Dance Referring Phys: [id number] CARINA M BROWN IMPRESSIONS  1. Left ventricular ejection fraction, by estimation, is 65 to 70%. The left ventricle has normal function. The left ventricle has no regional wall motion abnormalities. There is mild asymmetric left ventricular hypertrophy. Left ventricular diastolic parameters were normal.  2. Right ventricular systolic function is normal. The right ventricular size is normal.  3. The mitral valve is normal in structure. Trivial mitral valve regurgitation. No evidence of mitral stenosis.  4. The aortic valve is normal in structure. Aortic valve regurgitation is not visualized. No aortic stenosis is present. FINDINGS  Left Ventricle: Left ventricular ejection fraction, by estimation, is 65 to 70%. The left ventricle has normal function. The left ventricle has no regional wall motion abnormalities. The left ventricular internal cavity size was normal in size. There is  mild asymmetric left ventricular hypertrophy. Left ventricular diastolic parameters were normal. Right Ventricle: The right ventricular size is normal. No increase in right ventricular wall thickness. Right ventricular systolic function is normal. Left Atrium: Left atrial size was normal in size.  Right Atrium: Right atrial size was normal in size. Pericardium: There is no evidence of pericardial effusion. Mitral Valve: The mitral valve is normal in structure. Trivial mitral valve regurgitation. No evidence of mitral valve stenosis. Tricuspid Valve: The tricuspid valve is normal in structure. Tricuspid valve regurgitation  is not demonstrated. No evidence of tricuspid stenosis. Aortic Valve: The aortic valve is normal in structure. Aortic valve regurgitation is not visualized. No aortic stenosis is present. Pulmonic Valve: The pulmonic valve was normal in structure. Pulmonic valve regurgitation is not visualized. No evidence of pulmonic stenosis. Aorta: The aortic root and ascending aorta are structurally normal, with no evidence of dilitation. IAS/Shunts: The atrial septum is grossly normal.  LEFT VENTRICLE PLAX 2D LVIDd:         4.20 cm  Diastology LVIDs:         2.60 cm  LV e' lateral:   8.49 cm/s LV PW:         1.30 cm  LV E/e' lateral: 13.3 LV IVS:        1.10 cm  LV e' medial:    6.53 cm/s LVOT diam:     2.30 cm  LV E/e' medial:  17.3 LV SV:         98 LV SV Index:   46 LVOT Area:     4.15 cm  RIGHT VENTRICLE             IVC RV Basal diam:  2.20 cm     IVC diam: 1.60 cm RV S prime:     11.85 cm/s TAPSE (M-mode): 2.3 cm LEFT ATRIUM             Index       RIGHT ATRIUM           Index LA diam:        3.40 cm 1.58 cm/m  RA Area:     12.30 cm LA Vol (A2C):   72.1 ml 33.51 ml/m RA Volume:   24.40 ml  11.34 ml/m LA Vol (A4C):   39.6 ml 18.40 ml/m LA Biplane Vol: 55.3 ml 25.70 ml/m  AORTIC VALVE LVOT Vmax:   107.00 cm/s LVOT Vmean:  66.200 cm/s LVOT VTI:    0.237 m  AORTA Ao Root diam: 3.80 cm Ao Asc diam:  3.60 cm MITRAL VALVE MV Area (PHT): 3.65 cm     SHUNTS MV Decel Time: 208 msec     Systemic VTI:  0.24 m MV E velocity: 113.00 cm/s  Systemic Diam: 2.30 cm MV A velocity: 118.00 cm/s MV E/A ratio:  0.96 Kristeen Miss MD Electronically signed by Kristeen Miss MD Signature Date/Time: 05/17/2020/12:58:52  PM    Final    VAS [place] LOWER EXTREMITY VENOUS (DVT)  Result Date: 05/18/2020  Lower Venous DVTStudy Indications: Edema, Swelling, and RT cellulitis, osteomyelitis, and gangrene.  Limitations: Poor ultrasound/tissue interface. Comparison Study: No prior studies. Performing Technologist: Jean Rosenthal  Examination Guidelines: A complete evaluation includes B-mode imaging, spectral Doppler, color Doppler, and power Doppler as needed of all accessible portions of each vessel. Bilateral testing is considered an integral part of a complete examination. Limited examinations for reoccurring indications may be performed as noted. The reflux portion of the exam is performed with the patient in reverse Trendelenburg.  +---------+---------------+---------+-----------+----------+------------------+ RIGHT    CompressibilityPhasicitySpontaneityPropertiesThrombus Aging     +---------+---------------+---------+-----------+----------+------------------+ CFV      Full           Yes      Yes                                     +---------+---------------+---------+-----------+----------+------------------+ SFJ      Full                                                            +---------+---------------+---------+-----------+----------+------------------+  FV Prox  Full                                                            +---------+---------------+---------+-----------+----------+------------------+ FV Mid   Full                                                            +---------+---------------+---------+-----------+----------+------------------+ FV DistalFull                                                            +---------+---------------+---------+-----------+----------+------------------+ PFV      Full                                                            +---------+---------------+---------+-----------+----------+------------------+ POP      Full           Yes       Yes                                     +---------+---------------+---------+-----------+----------+------------------+ PTV      Full                                                            +---------+---------------+---------+-----------+----------+------------------+ PERO     Full                                         Limited                                                                  visualization      +---------+---------------+---------+-----------+----------+------------------+   +----+---------------+---------+-----------+----------+--------------+ LEFTCompressibilityPhasicitySpontaneityPropertiesThrombus Aging +----+---------------+---------+-----------+----------+--------------+ CFV Full           Yes      Yes                                 +----+---------------+---------+-----------+----------+--------------+     Summary: RIGHT: - There is no evidence of deep vein thrombosis in the lower extremity. However, portions of this examination were limited- see technologist comments above.  - No cystic structure found  in the popliteal fossa.  LEFT: - No evidence of common femoral vein obstruction.  *See table(s) above for measurements and observations. Electronically signed by Lemar LivingsBrandon Cain MD on 05/18/2020 at 9:03:29 PM.    Final      Derrel Nipresenzo, Victor, MD 05/19/2020, 5:58 AM PGY-1, [facility] FPT[facility] Intern pager: [phone], text pages welcome

## 2020-05-19 NOTE — Progress Notes (Signed)
Orthopedic Tech Progress Note Patient Details:  Ricky Hanson [birth year] [id number] Called in order to [facility] for a VIVE PROTOCOL BK  Patient ID: Ricky Hanson, male   DOB: 01-25-1951, 69 y.o.   MRN: [id number]   Ricky Hanson 05/19/2020, 2:17 PM

## 2020-05-19 NOTE — Transfer of Care (Signed)
Immediate Anesthesia Transfer of Care Note  Patient: Ricky Hanson  Procedure(s) Performed: RIGHT BELOW KNEE AMPUTATION (Right Knee)  Patient Location: PACU  Anesthesia Type:General and Regional  Level of Consciousness: drowsy  Airway & Oxygen Therapy: Patient Spontanous Breathing and Patient connected to face mask oxygen  Post-op Assessment: Report given to RN and Post -op Vital signs reviewed and stable  Post vital signs: Reviewed and stable  Last Vitals:  Vitals Value Taken Time  BP 140/68 05/19/20 1316  Temp    Pulse 77 05/19/20 1318  Resp 15 05/19/20 1318  SpO2 100 % 05/19/20 1318  Vitals shown include unvalidated device data.  Last Pain:  Vitals:   05/19/20 1035  TempSrc:   PainSc: (P) 0-No pain      Patients Stated Pain Goal: 0 (05/18/20 2354)  Complications: No complications documented.

## 2020-05-19 NOTE — Progress Notes (Signed)
Loura Halt, son and power of attorney to patient called regarding surgery. He explained that he approved of a toe amputation but did not approve of a right below the knee amputation. Patient was in pre-op, I told [name] I would have Dr. Lajoyce Corners or myself call him back. When I went to peri-op Dr. Lajoyce Corners and Dr. Stephannie Peters was at the bedside with the patient, I explained the conversation with Dr. Lajoyce Corners and he called the son Loura Halt immediately. Dr. Lajoyce Corners was still on the phone when I left. Contact info for [employer] updated in the chart.  Hermina Barters, RN

## 2020-05-20 ENCOUNTER — Encounter (HOSPITAL_COMMUNITY): Payer: Self-pay | Admitting: Orthopedic Surgery

## 2020-05-20 LAB — BASIC METABOLIC PANEL
Anion gap: 10 (ref 5–15)
BUN: 7 mg/dL — ABNORMAL LOW (ref 8–23)
CO2: 21 mmol/L — ABNORMAL LOW (ref 22–32)
Calcium: 8.3 mg/dL — ABNORMAL LOW (ref 8.9–10.3)
Chloride: 104 mmol/L (ref 98–111)
Creatinine, Ser: 1.01 mg/dL (ref 0.61–1.24)
GFR calc Af Amer: >60 mL/min (ref >60)
GFR calc non Af Amer: >60 mL/min (ref >60)
Glucose, Bld: 154 mg/dL — ABNORMAL HIGH (ref 70–99)
Potassium: 3.9 mmol/L (ref 3.5–5.1)
Sodium: 135 mmol/L (ref 135–145)

## 2020-05-20 LAB — GLUCOSE, CAPILLARY
Glucose-Capillary: 141 mg/dL — ABNORMAL HIGH (ref 70–99)
Glucose-Capillary: 140 mg/dL — ABNORMAL HIGH (ref 70–99)
Glucose-Capillary: 108 mg/dL — ABNORMAL HIGH (ref 70–99)
Glucose-Capillary: 127 mg/dL — ABNORMAL HIGH (ref 70–99)

## 2020-05-20 LAB — CBC
HCT: 24.8 % — ABNORMAL LOW (ref 39.0–52.0)
Hemoglobin: 8.2 g/dL — ABNORMAL LOW (ref 13.0–17.0)
MCH: 27.2 pg (ref 26.0–34.0)
MCHC: 33.1 g/dL (ref 30.0–36.0)
MCV: 82.4 fL (ref 80.0–100.0)
Platelets: 317 10*3/uL (ref 150–400)
RBC: 3.01 MIL/uL — ABNORMAL LOW (ref 4.22–5.81)
RDW: 13.9 % (ref 11.5–15.5)
WBC: 11.3 10*3/uL — ABNORMAL HIGH (ref 4.0–10.5)
nRBC: 0 % (ref 0.0–0.2)

## 2020-05-20 MED ORDER — OXYCODONE HCL 5 MG PO TABS
5.0000 mg | ORAL_TABLET | ORAL | Status: DC | PRN
  Administered 2020-05-21 (×2): 5 mg via ORAL
  Filled 2020-05-20 (×2): qty 1

## 2020-05-20 MED ORDER — OXYCODONE HCL 5 MG PO TABS: 5.0000 mg | ORAL_TABLET | ORAL | Status: DC | PRN

## 2020-05-20 MED ORDER — SODIUM CHLORIDE 0.9 % IV SOLN: 250.0000 mL | INTRAVENOUS | Status: DC | PRN

## 2020-05-20 NOTE — Progress Notes (Signed)
Patient ID: Ricky Hanson, male   DOB: 1951/01/22, 69 y.o.   MRN: [id number] Patient is postoperative day 1 right transtibial amputation.  He is sitting up in bed eating he has no complaints.  There is no drainage in the wound VAC canister.  Anticipate discharge to skilled nursing.

## 2020-05-20 NOTE — Progress Notes (Signed)
PT Cancellation Note  Patient Details Name: Ricky Hanson MRN: [id number] DOB: 05/04/1951   Cancelled Treatment:    Reason Eval/Treat Not Completed: Fatigue/lethargy limiting ability to participate. Patient sleeping, attempted to rouse with voice/touch. Patient unable to wake. Will re-attempt tomorrow.     Ardean Simonich 05/20/2020, 1:26 PM

## 2020-05-20 NOTE — Progress Notes (Signed)
PT Cancellation Note  Patient Details Name: Ricky Hanson MRN: [id number] DOB: 10/24/1951   Cancelled Treatment:    Reason Eval/Treat Not Completed: Fatigue/lethargy limiting ability to participate. Patient more awake, but continues to have difficulty responding or following commands. Will hold evaluation until tomorrow.     Daisee Centner 05/20/2020, 2:58 PM

## 2020-05-20 NOTE — Progress Notes (Signed)
Family Medicine Teaching Service Daily Progress Note Intern Pager: [phone]  Patient name: Ricky Hanson Medical record number: [id number] Date of birth: 1951-03-24 Age: 69 y.o. Gender: male  Primary Care Provider: Hoy Register, MD Consultants: Orthopedics, vascular, cardiology Code Status: Full code  Pt Overview and Major Events to Date:  6/22-patient admitted for gangrene of second toe on right lower extremity 6/25-BKA on the right  Assessment and Plan: Ricky Hanson is a 69 y.o. male presenting with infection of right second toe. PMH is significant for DM  Right foot osteomyelitis  Sepsis Postop day 1.  Status post right BKA.  Denies pain this morning.  Mild leukocytosis, WBC 11.3 though downtrending from yesterday 15.7.  Patient remains afebrile.  Await surgical recommendations regarding patient's antibiotics. -Ortho following, appreciate recommendations -Cardiology signed off, appreciate recommendations -Continue IV vancomycin and cefepime  -Flagyl 500 mg every 8 hours -KVO IV fluids -Scheduled Tylenol 1000 mg every 8 hours -Dilaudid 0.5 mg IV every 4 hours as needed -Oxycodone 5 mg IR 4 hours -Reglan every 8 hours as needed -Continue 5 mg oxycodone every 4 hours  Peripheral vascular disease Patient received transtibial amputation yesterday.  Dr. Jacinto Halim, cardiologist, follow-up with patient outpatient for risk modification.   -Cardiology signed off, patient recommendations -Aspirin 81 mg and rivaroxaban per cardiology -Peripheral arteriogram showing occlusion of 3 vessels in proximal calf. -Recommend BKA given poor likelihood of healing  Hypertension Blood pressures have ranged from 116/120-161/71.  Medications include amlodipine 10 mg daily, lisinopril 10 mg daily.  Patient's cousin helps with his medications and reports good adherence. -Continue home amlodipine and lisinopril -Vitals per protocol  Hyperlipidemia -Atorvastatin increased to 20 mg daily on  admission  Prediabetes Glucose 154 this morning.  Patient received 12 units sliding scale insulin yesterday.  Patient's hemoglobin A1c in March 2021 was 5.5. -Sliding scale insulin   AKI on CKD 3A Resolved.  Creatinine 1.81 on admission.  Creatinine is stable at 1.01 this morning. -Continue IV hydration -Morning BMPs -Avoid nephrotoxic agents  Normocytic anemia  iron deficiency anemia Hemoglobin stable at 8.2 today . -Consider Feraheme -AM CBCs  Chronic indwelling Foley Urine culture grew multiple species.  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  FEN/GI: Carb modified diet, replete electrolytes as needed PPx: SCD  Disposition: Pending BKA by orthopedics  Subjective:  Patient denies pain this morning.  Had no significant overnight events.  Objective: Temp:  [97 F (36.1 C)-98.7 F (37.1 C)] 98.5 F (36.9 C) (06/26 0414) Pulse Rate:  [67-85] 67 (06/26 0414) Resp:  [10-21] 13 (06/26 0414) BP: (116-161)/(52-79) 148/76 (06/26 0414) SpO2:  [95 %-100 %] 95 % (06/26 0414) Weight:  [87 kg] 87 kg (06/26 0414)  Physical Exam: GEN: Alert, watching television, no acute distress CV: regular rate and rhythm RESP: no increased work of breathing, clear to ascultation bilaterally  ABD: Bowel sounds present. Soft, Nontender, Nondistended. MSK: Status post right BKA stump protector in place, no swelling left lower extremity SKIN: warm, dry    Laboratory: Recent Labs  Lab 05/18/20 0824 05/19/20 0616 05/20/20 0346  WBC 13.4* 15.7* 11.3*  HGB 8.1* 8.6* 8.2*  HCT 24.4* 25.9* 24.8*  PLT 306 349 317   Recent Labs  Lab 05/15/20 1559 05/16/20 0844 05/18/20 0824 05/19/20 0616 05/20/20 0346  NA 130*   < > 136 134* 135  K 4.5   < > 3.8 3.7 3.9  CL 99   < > 109 105 104  CO2 21*   < >  19* 20* 21*  BUN 15   < > 11 5* 7*  CREATININE 1.57*   < > 1.21 1.17 1.01  CALCIUM 9.0   < > 8.3* 8.7* 8.3*  PROT 7.6  --   --    --   --   BILITOT 0.8  --   --   --   --   ALKPHOS 140*  --   --   --   --   ALT 40  --   --   --   --   AST 24  --   --   --   --   GLUCOSE 159*   < > 124* 138* 154*   < > = values in this interval not displayed.     Imaging/Diagnostic Tests: VAS [place] LOWER EXTREMITY VENOUS (DVT)  Result Date: 05/18/2020  Lower Venous DVTStudy Indications: Edema, Swelling, and RT cellulitis, osteomyelitis, and gangrene.  Limitations: Poor ultrasound/tissue interface. Comparison Study: No prior studies. Performing Technologist: Darlin Coco  Examination Guidelines: A complete evaluation includes B-mode imaging, spectral Doppler, color Doppler, and power Doppler as needed of all accessible portions of each vessel. Bilateral testing is considered an integral part of a complete examination. Limited examinations for reoccurring indications may be performed as noted. The reflux portion of the exam is performed with the patient in reverse Trendelenburg.  +---------+---------------+---------+-----------+----------+------------------+ RIGHT    CompressibilityPhasicitySpontaneityPropertiesThrombus Aging     +---------+---------------+---------+-----------+----------+------------------+ CFV      Full           Yes      Yes                                     +---------+---------------+---------+-----------+----------+------------------+ SFJ      Full                                                            +---------+---------------+---------+-----------+----------+------------------+ FV Prox  Full                                                            +---------+---------------+---------+-----------+----------+------------------+ FV Mid   Full                                                            +---------+---------------+---------+-----------+----------+------------------+ FV DistalFull                                                             +---------+---------------+---------+-----------+----------+------------------+ PFV      Full                                                            +---------+---------------+---------+-----------+----------+------------------+  POP      Full           Yes      Yes                                     +---------+---------------+---------+-----------+----------+------------------+ PTV      Full                                                            +---------+---------------+---------+-----------+----------+------------------+ PERO     Full                                         Limited                                                                  visualization      +---------+---------------+---------+-----------+----------+------------------+   +----+---------------+---------+-----------+----------+--------------+ LEFTCompressibilityPhasicitySpontaneityPropertiesThrombus Aging +----+---------------+---------+-----------+----------+--------------+ CFV Full           Yes      Yes                                 +----+---------------+---------+-----------+----------+--------------+     Summary: RIGHT: - There is no evidence of deep vein thrombosis in the lower extremity. However, portions of this examination were limited- see technologist comments above.  - No cystic structure found in the popliteal fossa.  LEFT: - No evidence of common femoral vein obstruction.  *See table(s) above for measurements and observations. Electronically signed by Lemar Livings MD on 05/18/2020 at 9:03:29 PM.    Final      Katha Cabal, DO 05/20/2020, 6:04 AM PGY-1, [facility] Family Medicine FPTS Intern pager: [phone], text pages welcome

## 2020-05-21 LAB — CBC
HCT: 24.1 % — ABNORMAL LOW (ref 39.0–52.0)
Hemoglobin: 7.8 g/dL — ABNORMAL LOW (ref 13.0–17.0)
MCH: 27.1 pg (ref 26.0–34.0)
MCHC: 32.4 g/dL (ref 30.0–36.0)
MCV: 83.7 fL (ref 80.0–100.0)
Platelets: 348 10*3/uL (ref 150–400)
RBC: 2.88 MIL/uL — ABNORMAL LOW (ref 4.22–5.81)
RDW: 14 % (ref 11.5–15.5)
WBC: 12.2 10*3/uL — ABNORMAL HIGH (ref 4.0–10.5)
nRBC: 0 % (ref 0.0–0.2)

## 2020-05-21 LAB — GLUCOSE, CAPILLARY
Glucose-Capillary: 111 mg/dL — ABNORMAL HIGH (ref 70–99)
Glucose-Capillary: 149 mg/dL — ABNORMAL HIGH (ref 70–99)
Glucose-Capillary: 137 mg/dL — ABNORMAL HIGH (ref 70–99)
Glucose-Capillary: 155 mg/dL — ABNORMAL HIGH (ref 70–99)

## 2020-05-21 LAB — CULTURE, BLOOD (ROUTINE X 2)
Culture: NO GROWTH
Culture: NO GROWTH

## 2020-05-21 LAB — BASIC METABOLIC PANEL
Anion gap: 6 (ref 5–15)
BUN: 7 mg/dL — ABNORMAL LOW (ref 8–23)
CO2: 23 mmol/L (ref 22–32)
Calcium: 8.4 mg/dL — ABNORMAL LOW (ref 8.9–10.3)
Chloride: 105 mmol/L (ref 98–111)
Creatinine, Ser: 1.27 mg/dL — ABNORMAL HIGH (ref 0.61–1.24)
GFR calc Af Amer: >60 mL/min (ref >60)
GFR calc non Af Amer: 58 mL/min — ABNORMAL LOW (ref >60)
Glucose, Bld: 128 mg/dL — ABNORMAL HIGH (ref 70–99)
Potassium: 4.1 mmol/L (ref 3.5–5.1)
Sodium: 134 mmol/L — ABNORMAL LOW (ref 135–145)

## 2020-05-21 NOTE — Evaluation (Signed)
Physical Therapy Evaluation Patient Details Name: Ricky Hanson MRN: [id number] DOB: 04-Nov-1951 Today's Date: 05/21/2020   History of Present Illness  Ricky Hanson a 69 y.o.malepresenting with infection of right second toe. Pt underwent right BKA. PMH is significant forDM  Clinical Impression  Pt admitted with above diagnosis. Pt was able to squat pivot to chair with mod assist of 2 therapists.  Pt could not stand to RW questionably due to left LE weakness vs. pts intermittent confusion. Pt will need SNF for rehab to gain strength and independence. Will follow acutely.  Pt currently with functional limitations due to the deficits listed below (see PT Problem List). Pt will benefit from skilled PT to increase their independence and safety with mobility to allow discharge to the venue listed below.     Follow Up Recommendations SNF;Supervision/Assistance - 24 hour    Equipment Recommendations  Other (comment);Wheelchair (measurements PT);Wheelchair cushion (measurements PT) (TBA)    Recommendations for Other Services       Precautions / Restrictions Precautions Precautions: Fall Precaution Comments: VAC on right residual limb Other Brace: Limb guard right Residual limb Restrictions Weight Bearing Restrictions: No      Mobility  Bed Mobility Overal bed mobility: Needs Assistance Bed Mobility: Supine to Sit     Supine to sit: Min assist;+2 for physical assistance;HOB elevated     General bed mobility comments: Pt needed min assist to come to EOB.  Needed cues to initiate movement. Needed use of rail as well and incr time to get hips to EOB.    Transfers Overall transfer level: Needs assistance Equipment used: Rolling walker (2 wheeled);None Transfers: Sit to/from [employer] Sit to Stand: Mod assist;+2 physical assistance;From elevated surface   Squat pivot transfers: Mod assist;Min assist;+2 physical assistance;From elevated surface     General  transfer comment: Pt attempted to stand to RW x 2 and could stand partially but could not extend left knee fully for upright stance nor was he using his UEs prn to stand.  Pt was able to transfer squat pivot to drop arm recliner with mod assist of 2 therapists and use of pad for anterior lean.    Ambulation/Gait             General Gait Details: unable to progress today.   Stairs            Wheelchair Mobility    Modified Rankin (Stroke Patients Only)       Balance Overall balance assessment: Needs assistance Sitting-balance support: No upper extremity supported;Feet supported Sitting balance-Leahy Scale: Fair     Standing balance support: Bilateral upper extremity supported;During functional activity Standing balance-Leahy Scale: Zero Standing balance comment: could not achieve full upright stance.                              Pertinent Vitals/Pain Pain Assessment: 0-10 Pain Score: 6  Pain Location: right LE Pain Descriptors / Indicators: Aching;Grimacing;Guarding;Discomfort Pain Intervention(s): Limited activity within patient's tolerance;Monitored during session;Repositioned    Home Living Family/patient expects to be discharged to:: Private residence Living Arrangements: Other relatives (lived with cousin) Available Help at Discharge: Family;Available PRN/intermittently (cousin works Systems developer) Type of Home: House Home Access: Level entry     Home Layout: One level Home Equipment: None Additional Comments: pt was a former Advice worker guard     Prior Function Level of Independence: Independent         Comments:  Pt confused intermittently therefore questionable reliability of home info     Hand Dominance   Dominant Hand: Right    Extremity/Trunk Assessment   Upper Extremity Assessment Upper Extremity Assessment: Defer to OT evaluation    Lower Extremity Assessment Lower Extremity Assessment: RLE deficits/detail;LLE  deficits/detail RLE Deficits / Details: difficult to assess due to pain and limb guard in place RLE: Unable to fully assess due to immobilization LLE Deficits / Details: grossly 2+/5    Cervical / Trunk Assessment Cervical / Trunk Assessment: Normal  Communication   Communication: No difficulties  Cognition Arousal/Alertness: Awake/alert Behavior During Therapy: Flat affect Overall Cognitive Status: Impaired/Different from baseline Area of Impairment: Orientation;Memory;Following commands;Safety/judgement;Awareness;Problem solving                 Orientation Level: Disoriented to;Situation;Time (Said 2021 initially but later said it was 1991)   Memory: Decreased recall of precautions;Decreased short-term memory Following Commands: Follows one step commands inconsistently;Follows one step commands with increased time Safety/Judgement: Decreased awareness of safety;Decreased awareness of deficits   Problem Solving: Slow processing;Decreased initiation;Difficulty sequencing;Requires verbal cues;Requires tactile cues        General Comments General comments (skin integrity, edema, etc.): VSS    Exercises     Assessment/Plan    PT Assessment Patient needs continued PT services  PT Problem List Decreased activity tolerance;Decreased balance;Decreased mobility;Decreased knowledge of use of DME;Decreased safety awareness;Decreased knowledge of precautions;Pain       PT Treatment Interventions DME instruction;Gait training;Stair training;Functional mobility training;Therapeutic activities;Therapeutic exercise;Balance training;Patient/family education;Wheelchair mobility training    PT Goals (Current goals can be found in the Care Plan section)  Acute Rehab PT Goals Patient Stated Goal: to get better and go home PT Goal Formulation: With patient Time For Goal Achievement: 06/04/20 Potential to Achieve Goals: Good    Frequency Min 3X/week   Barriers to discharge  Decreased caregiver support lives with cousin who works    Co-evaluation PT/OT/SLP Co-Evaluation/Treatment: Yes Reason for Co-Treatment: For Doctor, hospital;To address functional/ADL transfers PT goals addressed during session: Mobility/safety with mobility         AM-PAC PT "6 Clicks" Mobility  Outcome Measure Help needed turning from your back to your side while in a flat bed without using bedrails?: A Lot Help needed moving from lying on your back to sitting on the side of a flat bed without using bedrails?: A Lot Help needed moving to and from a bed to a chair (including a wheelchair)?: A Lot Help needed standing up from a chair using your arms (e.g., wheelchair or bedside chair)?: A Lot Help needed to walk in hospital room?: Total Help needed climbing 3-5 steps with a railing? : Total 6 Click Score: 10    End of Session Equipment Utilized During Treatment: Gait belt Activity Tolerance: Patient limited by fatigue Patient left: in chair;with call bell/phone within reach;with chair alarm set Nurse Communication: Mobility status PT Visit Diagnosis: Unsteadiness on feet (R26.81);Muscle weakness (generalized) (M62.81)    Time: 4481-8563 PT Time Calculation (min) (ACUTE ONLY): 39 min   Charges:   PT Evaluation $PT Eval Moderate Complexity: 1 Mod PT Treatments $Therapeutic Activity: 8-22 mins        Charlotte Fidalgo W,PT Acute Rehabilitation Services Pager:  [phone]  Office:  [phone]    Denice Paradise 05/21/2020, 11:21 AM

## 2020-05-21 NOTE — Hospital Course (Addendum)
Ricky Hanson is a 69 y.o. male presenting with infection of right second toe. PMH is significant for DM, peripheral vascular disease, hypertension, hyperlipidemia, prediabetes, CKD 3A, normocytic anemia and, chronic indwelling Foley.  Below is his hospital course by problem.  Right foot osteomyelitis  sepsis Patient presented with sepsis due to right second toe infection and wet gangrene.  Patient's WBC was elevated on admission and he was started on vancomycin, cefepime, Zosyn.  Patient was followed by Dr. Loreta Ave and so was initially seen by him.  After conversations with vascular, orthopedic surgery, and Dr. Jacinto Halim the decision was made to have Dr. Jacinto Halim perform an arteriogram to determine if there is any ability to salvage the patient's foot.  The results showed that this was not possible on it was recommended that he undergo a transtibial amputation.  Dr. Lajoyce Corners performed the transtibial amputation on 6/25.  Antibiotics were continued for 24 hours after the procedure and then discontinued.  A wound VAC was placed after the procedure and per surgery should remain on for 1 week.  Patient was instructed to follow-up with orthopedic surgery 1 week after the procedure.  AKI on CKD 3A Patient presented with a creatinine of 1.81.  He was hydrated and his infection was treated.  His creatinines were trended and his AKI resolved.  Creatinine on the day of discharge was 1.18.  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  His next dose was due on 625 so this was ordered and he was given it.  He will need his next dose on 7/25.

## 2020-05-21 NOTE — TOC Progression Note (Signed)
Transition of Care [facility]) - Progression Note    Patient Details  Name: Ricky Hanson MRN: [id number] Date of Birth: Feb 11, 1951  Transition of Care [facility] CM/SW Contact  Nonda Lou, [place] Phone Number: 05/21/2020, 5:56 PM  Clinical Narrative:     CSW contacted patient's cousin Mr. [name] to discuss PT recommendation for short-term SNF placement at discharge. Mr. [name] expressed he takes care of patient, but patient's son Mr. Ninetta Lights is the [facility] for patient. Mr. [name] requested CSW contacted patient's son to discuss discharge plan.   CSW attempted to contact son Mr. Hatcher and left a voicemail, awaiting a call back. CSW will continue to follow and assist with discharge.  Expected Discharge Plan: Home w Home Health Services Barriers to Discharge: Continued Medical Work up  Expected Discharge Plan and Services Expected Discharge Plan: Home w Home Health Services In-house Referral: NA Discharge Planning Services: CM Consult Post Acute Care Choice: Home Health Living arrangements for the past 2 months: Single Family Home                                       Social Determinants of Health (SDOH) Interventions    Readmission Risk Interventions No flowsheet data found.

## 2020-05-21 NOTE — Evaluation (Signed)
Occupational Therapy Evaluation Patient Details Name: Ricky Hanson MRN: [id number] DOB: 1951/02/13 Today's Date: 05/21/2020    History of Present Illness Ricky Hanson is a 69 y.o. male presenting with infection of right second toe. Pt underwent right BKA. PMH is significant for DM   Clinical Impression   Pt admitted with above diagnoses, unsure of exact PLOF due to current mental status. Pt is disoriented and giving conflicting reports. States that it is 69 and his mother takes care of him. Unsure of his ability to complete BADL and mobility PTA. At time of eval, pt completed bed mobility with min A +2 and squat pivot transfer with max A +2 to recliner. Attempted to have pt use RW- he was unable to process commands and physically unable to achieve full stand for safe transfer. Given current status, recommend SNF at d/c for continued progression of BADL prior to d/c home. Will continue to follow per POC listed below.    Follow Up Recommendations  SNF    Equipment Recommendations  3 in 1 bedside commode;Wheelchair (measurements OT);Wheelchair cushion (measurements OT);Hospital bed    Recommendations for Other Services       Precautions / Restrictions Precautions Precautions: Fall Precaution Comments: VAC on right residual limb Required Braces or Orthoses: Other Brace Other Brace: Limb guard right Residual limb Restrictions Weight Bearing Restrictions: Yes RLE Weight Bearing: Non weight bearing      Mobility Bed Mobility Overal bed mobility: Needs Assistance Bed Mobility: Supine to Sit     Supine to sit: Min assist;+2 for physical assistance;HOB elevated     General bed mobility comments: Pt needed min assist to come to EOB.  Needed cues to initiate movement. Needed use of rail as well and incr time to get hips to EOB.    Transfers Overall transfer level: Needs assistance Equipment used: Rolling walker (2 wheeled);None Transfers: Sit to/from [employer] Sit to Stand: Mod assist;+2 physical assistance;From elevated surface   Squat pivot transfers: Max assist;+2 physical assistance;+2 safety/equipment     General transfer comment: Pt attempted to stand to RW x 2 and could stand partially but could not extend left knee fully for upright stance nor was he using his UEs prn to stand.  Pt was able to transfer squat pivot to drop arm recliner with mod assist of 2 therapists and use of pad for anterior lean.      Balance Overall balance assessment: Needs assistance Sitting-balance support: No upper extremity supported;Feet supported Sitting balance-Leahy Scale: Fair     Standing balance support: Bilateral upper extremity supported;During functional activity Standing balance-Leahy Scale: Zero Standing balance comment: could not achieve full upright stance.                            ADL either performed or assessed with clinical judgement   ADL Overall ADL's : Needs assistance/impaired Eating/Feeding: Set up;Sitting   Grooming: Set up;Sitting   Upper Body Bathing: Minimal assistance;Sitting   Lower Body Bathing: Maximal assistance;Sitting/lateral leans;Sit to/from stand   Upper Body Dressing : Minimal assistance;Sitting   Lower Body Dressing: Maximal assistance;Sitting/lateral leans;Sit to/from stand;+2 for physical assistance Lower Body Dressing Details (indicate cue type and reason): pt is currently max A +2 for sit <> stands and steadying Toilet Transfer: Maximal assistance;+2 for physical assistance;+2 for safety/equipment;Squat-pivot Toilet Transfer Details (indicate cue type and reason): pt originally attempted with RW and was not able to clear hips; ultimately needing max A +2  for squat pivot Toileting- Clothing Manipulation and Hygiene: Maximal assistance;Total assistance;Sit to/from stand       Functional mobility during ADLs: Maximal assistance;+2 for physical assistance;+2 for safety/equipment;Cueing  for safety;Cueing for sequencing (squat pivot only)       Vision Patient Visual Report: No change from baseline       Perception     Praxis      Pertinent Vitals/Pain Pain Assessment: 0-10 Pain Score: 6  Pain Location: right LE Pain Descriptors / Indicators: Aching;Grimacing;Guarding;Discomfort Pain Intervention(s): Monitored during session;Repositioned     Hand Dominance Right   Extremity/Trunk Assessment Upper Extremity Assessment Upper Extremity Assessment: Generalized weakness   Lower Extremity Assessment Lower Extremity Assessment: Defer to PT evaluation       Communication Communication Communication: No difficulties   Cognition Arousal/Alertness: Awake/alert Behavior During Therapy: Flat affect Overall Cognitive Status: No family/caregiver present to determine baseline cognitive functioning Area of Impairment: Orientation                 Orientation Level: Disoriented to;Time;Situation (said 2021; then later said it was 1991. Could not tell OT why he was in the hospital even after pointing out new residual limb)   Memory: Decreased recall of precautions;Decreased short-term memory Following Commands: Follows one step commands inconsistently;Follows one step commands with increased time Safety/Judgement: Decreased awareness of safety;Decreased awareness of deficits   Problem Solving: Slow processing;Decreased initiation;Difficulty sequencing;Requires verbal cues;Requires tactile cues General Comments: pt presenting very disoriented to current situation, making it difficult to gather exact PLOF. increased time and cueing needed for basic mobility tasks   General Comments       Exercises     Shoulder Instructions      Home Living Family/patient expects to be discharged to:: Private residence Living Arrangements: Other relatives (cousin) Available Help at Discharge: Family;Available PRN/intermittently (cousin not always home) Type of Home:  House Home Access: Level entry     Home Layout: One level     Bathroom Shower/Tub: Teacher, early years/pre: Standard     Home Equipment: None   Additional Comments: pt was a former Sports administrator. Is confused at time of eval and giving varying history.      Prior Functioning/Environment          Comments: unsure of exact PLOF. Pt is poor historian and giving conflicting reports        OT Problem List: Decreased strength;Decreased knowledge of use of DME or AE;Decreased knowledge of precautions;Decreased activity tolerance;Impaired balance (sitting and/or standing);Pain;Decreased cognition;Decreased safety awareness      OT Treatment/Interventions: Self-care/ADL training;Therapeutic exercise;Patient/family education;Balance training;Energy conservation;Therapeutic activities;DME and/or AE instruction    OT Goals(Current goals can be found in the care plan section) Acute Rehab OT Goals Patient Stated Goal: to get better and go home OT Goal Formulation: With patient Time For Goal Achievement: 06/04/20 Potential to Achieve Goals: Good  OT Frequency: Min 2X/week   Barriers to D/C:            Co-evaluation PT/OT/SLP Co-Evaluation/Treatment: Yes Reason for Co-Treatment: For patient/therapist safety;To address functional/ADL transfers;Necessary to address cognition/behavior during functional activity PT goals addressed during session: Mobility/safety with mobility OT goals addressed during session: ADL's and self-care;Proper use of Adaptive equipment and DME;Strengthening/ROM      AM-PAC OT "6 Clicks" Daily Activity     Outcome Measure Help from another person eating meals?: None Help from another person taking care of personal grooming?: A Little Help from another person toileting, which  includes using toliet, bedpan, or urinal?: A Lot Help from another person bathing (including washing, rinsing, drying)?: A Lot Help from another person to  put on and taking off regular upper body clothing?: A Little Help from another person to put on and taking off regular lower body clothing?: Total 6 Click Score: 15   End of Session Equipment Utilized During Treatment: Gait belt;Rolling walker Nurse Communication: Mobility status  Activity Tolerance: Patient tolerated treatment well Patient left: in chair;with call bell/phone within reach;with chair alarm set  OT Visit Diagnosis: Other abnormalities of gait and mobility (R26.89);Muscle weakness (generalized) (M62.81);Pain;Other symptoms and signs involving cognitive function Pain - Right/Left: Right Pain - part of body: Leg                Time: 0856-9437 OT Time Calculation (min): 19 min Charges:  OT General Charges $OT Visit: 1 Visit OT Evaluation $OT Eval Moderate Complexity: 1 Mod  Dalphine Handing, MSOT, OTR/L Acute Rehabilitation Services [facility] Office Number: [phone] Pager: [phone]  Dalphine Handing 05/21/2020, 1:42 PM

## 2020-05-21 NOTE — Progress Notes (Signed)
Family Medicine Teaching Service Daily Progress Note Intern Pager: [phone]  Patient name: Ricky Hanson Medical record number: [id number] Date of birth: 09/09/1951 Age: 69 y.o. Gender: male  Primary Care Provider: Charlott Rakes, MD Consultants: Orthopedics, vascular, cardiology Code Status: Full code  Pt Overview and Major Events to Date:  6/22-patient admitted for gangrene of second toe on right lower extremity 6/25-BKA on the right  Assessment and Plan: Ricky Hanson is a 69 y.o. male presenting with infection of right second toe. PMH is significant for DM  Right foot osteomyelitis  Sepsis Postop day 1.  Status post right BKA.  Denies pain this morning.  Mild leukocytosis, WBC 12.2 today from 11.3 yesterday..  Patient reports pain is being well controlled.  Per surgery antibiotics can be discontinued.  Plan for discharge in the near future pending PTs evaluations and recommendations but most likely SNF. -Ortho following, appreciate recommendations -Cardiology signed off, appreciate recommendations -Discontinue antibiotics -One half maintenance IV fluids -Scheduled Tylenol 1000 mg every 8 hours -Dilaudid 0.5 mg IV every 4 hours as needed -Oxycodone 5 mg IR 4 hours -Reglan every 8 hours as needed -Continue 5 mg oxycodone every 4 hours  Peripheral vascular disease Patient received transtibial amputation yesterday.  Dr. Einar Gip, cardiologist, follow-up with patient outpatient for risk modification.   -Cardiology signed off, patient recommendations -Aspirin 81 mg and rivaroxaban per cardiology -Peripheral arteriogram showing occlusion of 3 vessels in proximal calf. -Recommend BKA given poor likelihood of healing  Hypertension Blood pressures have ranged from 116/120-161/71.  Medications include amlodipine 10 mg daily, lisinopril 10 mg daily.  Patient's cousin helps with his medications and reports good adherence. -Continue home amlodipine and lisinopril -Vitals per  protocol  Hyperlipidemia -Atorvastatin increased to 20 mg daily on admission  Prediabetes Glucose 154 this morning.  Patient received 12 units sliding scale insulin yesterday.  Patient's hemoglobin A1c in March 2021 was 5.5. -Sliding scale insulin   AKI on CKD 3A Resolved.  Creatinine 1.81 on admission.  Creatinine is stable at 1.27 this morning. -Continue IV hydration -Morning BMPs -Avoid nephrotoxic agents  Normocytic anemia  iron deficiency anemia Hemoglobin mildly decreased at 7.8 from 8.2 yesterday -Consider Feraheme -AM CBCs  Chronic indwelling Foley Urine culture grew multiple species.  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  FEN/GI: Carb modified diet, replete electrolytes as needed PPx: SCD  Disposition: Pending BKA by orthopedics  Subjective:  Patient doing well this morning and more willing to talk, less flat affect than previous evaluations.  Patient says that his pain is being relatively well controlled.  Patient reports that when he leaves here he may go back to live with his cousin but would be open to skilled nursing facilities.  I will touch base with his medical power of attorney regarding placement.  Objective: Temp:  [98.1 F (36.7 C)-98.8 F (37.1 C)] 98.7 F (37.1 C) (06/27 0437) Pulse Rate:  [66-71] 66 (06/27 0437) Resp:  [12-16] 12 (06/27 0437) BP: (124-148)/(59-73) 124/59 (06/27 0437) SpO2:  [98 %-100 %] 99 % (06/27 0437) Weight:  [86.5 kg] 86.5 kg (06/27 0437)  Physical Exam: GEN: Lying in bed, watching TV, no acute distress CV: Regular rate and rhythm RESP: Normal work of breathing, clear to auscultation bilaterally ABD: Positive bowel sounds MSK: S/p right BKA with protector in place, wound VAC in place.  No edema in left lower extremity SKIN: warm, dry    Laboratory: Recent Labs  Lab 05/19/20 0616 05/20/20 0346 05/21/20  0228  WBC 15.7* 11.3* 12.2*  HGB 8.6* 8.2*  7.8*  HCT 25.9* 24.8* 24.1*  PLT 349 317 348   Recent Labs  Lab 05/15/20 1559 05/16/20 0844 05/19/20 0616 05/20/20 0346 05/21/20 0228  NA 130*   < > 134* 135 134*  K 4.5   < > 3.7 3.9 4.1  CL 99   < > 105 104 105  CO2 21*   < > 20* 21* 23  BUN 15   < > 5* 7* 7*  CREATININE 1.57*   < > 1.17 1.01 1.27*  CALCIUM 9.0   < > 8.7* 8.3* 8.4*  PROT 7.6  --   --   --   --   BILITOT 0.8  --   --   --   --   ALKPHOS 140*  --   --   --   --   ALT 40  --   --   --   --   AST 24  --   --   --   --   GLUCOSE 159*   < > 138* 154* 128*   < > = values in this interval not displayed.     Imaging/Diagnostic Tests: No results found.   Derrel Nip, MD 05/21/2020, 7:00 AM PGY-1, [facility] Family Medicine FPTS Intern pager: [phone], text pages welcome

## 2020-05-22 LAB — BASIC METABOLIC PANEL
Anion gap: 7 (ref 5–15)
BUN: 13 mg/dL (ref 8–23)
CO2: 24 mmol/L (ref 22–32)
Calcium: 8.6 mg/dL — ABNORMAL LOW (ref 8.9–10.3)
Chloride: 105 mmol/L (ref 98–111)
Creatinine, Ser: 1.33 mg/dL — ABNORMAL HIGH (ref 0.61–1.24)
GFR calc Af Amer: >60 mL/min (ref >60)
GFR calc non Af Amer: 55 mL/min — ABNORMAL LOW (ref >60)
Glucose, Bld: 140 mg/dL — ABNORMAL HIGH (ref 70–99)
Potassium: 4.4 mmol/L (ref 3.5–5.1)
Sodium: 136 mmol/L (ref 135–145)

## 2020-05-22 LAB — CBC
HCT: 24.2 % — ABNORMAL LOW (ref 39.0–52.0)
Hemoglobin: 8 g/dL — ABNORMAL LOW (ref 13.0–17.0)
MCH: 27.3 pg (ref 26.0–34.0)
MCHC: 33.1 g/dL (ref 30.0–36.0)
MCV: 82.6 fL (ref 80.0–100.0)
Platelets: 360 10*3/uL (ref 150–400)
RBC: 2.93 MIL/uL — ABNORMAL LOW (ref 4.22–5.81)
RDW: 13.8 % (ref 11.5–15.5)
WBC: 11.2 10*3/uL — ABNORMAL HIGH (ref 4.0–10.5)
nRBC: 0 % (ref 0.0–0.2)

## 2020-05-22 LAB — GLUCOSE, CAPILLARY
Glucose-Capillary: 153 mg/dL — ABNORMAL HIGH (ref 70–99)
Glucose-Capillary: 130 mg/dL — ABNORMAL HIGH (ref 70–99)
Glucose-Capillary: 134 mg/dL — ABNORMAL HIGH (ref 70–99)
Glucose-Capillary: 127 mg/dL — ABNORMAL HIGH (ref 70–99)

## 2020-05-22 LAB — SURGICAL PATHOLOGY

## 2020-05-22 NOTE — Progress Notes (Signed)
Family Medicine Teaching Service Daily Progress Note Intern Pager: [phone]  Patient name: Ricky Hanson Medical record number: [id number] Date of birth: [birth year] Age: 69 y.o. Gender: male  Primary Care Provider: Hoy Register, MD Consultants: Orthopedics, vascular, cardiology Code Status: Full code  Pt Overview and Major Events to Date:  6/22-patient admitted for gangrene of second toe on right lower extremity 6/25-BKA on the right  Assessment and Plan: Ricky Hanson is a 69 y.o. male presenting with infection of right second toe. PMH is significant for DM  Right foot osteomyelitis  Sepsis Postop day 3.  Status post right BKA.  Patient reports pain is well controlled this morning.  He is more conversive than he has been on previous mornings.  Patient has mild leukocytosis at 11.2 down from 12.2 yesterday.  He has not used any as needed oxycodone so far today. 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  Dr. Jacinto Hanson, cardiologist, follow-up with patient outpatient for risk modification.   -Cardiology signed off, patient recommendations -Aspirin 81 mg and rivaroxaban per cardiology -Peripheral arteriogram showing occlusion of 3 vessels in proximal calf. -S/p BKA on 6/25  Hypertension Blood pressures have ranged from 117/45-133/55 over the last 24 hours.  Medications include amlodipine 10 mg daily, lisinopril 10 mg daily.  Patient's cousin helps with his medications and reports good adherence. -Continue home amlodipine and lisinopril -Vitals per protocol  Hyperlipidemia -Atorvastatin increased to 20 mg daily on  admission  Prediabetes Glucose 154 this morning.  Patient received 12 units sliding scale insulin yesterday.  Patient's hemoglobin A1c in March 2021 was 5.5. -Sliding scale insulin   AKI on CKD 3A Resolved.  Creatinine 1.81 on admission.  Creatinine is stable at 1.33 this morning. -Continue IV hydration -Morning BMPs -Avoid nephrotoxic agents  Normocytic anemia  iron deficiency anemia Hemoglobin mildly decreased at 8.0 from 7.8 yesterday.   -Consider Feraheme -AM CBCs   Chronic indwelling Foley Urine culture grew multiple species.  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  Reports his pain is being well controlled.  Is amenable to going to a SNF for rehabilitation prior to returning home.  I spoke with patient's son this morning as well and he is also on board for SNF placement.  The process has been started per social work.  Objective: Temp:  [98.4 F (36.9 C)-99 F (37.2 C)] 98.7 F (37.1 C) (06/28 0421) Pulse Rate:  [69-79] 69 (06/28 0421) Resp:  [11-20] 13 (06/28 0421) BP: (130-143)/(59-72) 137/70 (06/28 0421) SpO2:  [97 %-100 %] 100 % (06/28 0421) Weight:  [87.3 kg] 87.3 kg (06/28 0421)  Physical Exam: GEN: Lying in bed, no acute distress, watching TV CV: Regular rate and rhythm, no murmurs noted RESP: Lungs are clear to auscultation bilaterally, normal work of breathing y ABD: Positive bowel sounds, abdomen is soft, nontender MSK: S/p right BKA with protector in place, wound VAC in place.  No increased warmth or edema in left lower extremity SKIN: warm, dry    Laboratory: Recent Labs  Lab 05/20/20 0346 05/21/20 0228 05/22/20  0310  WBC 11.3* 12.2* 11.2*  HGB 8.2* 7.8* 8.0*  HCT 24.8* 24.1* 24.2*  PLT 317 348 360   Recent Labs  Lab  05/15/20 1559 05/16/20 0844 05/20/20 0346 05/21/20 0228 05/22/20 0310  NA 130*   < > 135 134* 136  K 4.5   < > 3.9 4.1 4.4  CL 99   < > 104 105 105  CO2 21*   < > 21* 23 24  BUN 15   < > 7* 7* 13  CREATININE 1.57*   < > 1.01 1.27* 1.33*  CALCIUM 9.0   < > 8.3* 8.4* 8.6*  PROT 7.6  --   --   --   --   BILITOT 0.8  --   --   --   --   ALKPHOS 140*  --   --   --   --   ALT 40  --   --   --   --   AST 24  --   --   --   --   GLUCOSE 159*   < > 154* 128* 140*   < > = values in this interval not displayed.     Imaging/Diagnostic Tests: No results found.   Ricky Nip, MD 05/22/2020, 6:01 AM PGY-1, [facility] Family Medicine FPTS Intern pager: [phone], text pages welcome

## 2020-05-22 NOTE — Progress Notes (Signed)
Status post right below-knee amputation.  Patient is comfortable.  0 cc in the canister wound VAC is functioning well with 2 green track marks.  Plan for discharge to nursing facility will need 1 week follow-up with Ortho

## 2020-05-22 NOTE — TOC Progression Note (Addendum)
Transition of Care [facility]) - Progression Note    Patient Details  Name: Ricky Hanson MRN: [id number] Date of Birth: January 11, 1951  Transition of [facility] CM/SW Contact  Nonda Lou, [place] Phone Number: 05/22/2020, 10:00 AM  Clinical Narrative:    4:12a CSW attempted follow-up with patient's son to provide bed offers. Left voicemail, awaiting a call back.  CSW contacted by [facility] to get bed choice and provide authorization information. CSW informed [facility] the bed choice is still pending and it will be provided once the family makes a choice. Requested covid from RN.    10a CSW spoke with patient's son Ricky Hanson and confirmed interest to move forward with SNF placement at discharge. [name] provided CSW permission to fax referrals to [facility]. CSW explained insurance authorization process and where to located Medicare.gov SNF ratings. No further questions expressed at this time.  CSW started insurance authorization with [facility].   Expected Discharge Plan: Skilled Nursing Facility Barriers to Discharge: Insurance Authorization  Expected Discharge Plan and Services Expected Discharge Plan: Skilled Nursing Facility In-house Referral: NA Discharge Planning Services: CM Consult Post Acute Care Choice: Home Health Living arrangements for the past 2 months: Single Family Home                                       Social Determinants of Health (SDOH) Interventions    Readmission Risk Interventions No flowsheet data found.

## 2020-05-22 NOTE — [place] FL2 (Signed)
[place] MEDICAID FL2 LEVEL OF CARE SCREENING TOOL     IDENTIFICATION  Patient Name: Ricky Hanson Birthdate: [birth year] Sex: male Admission Date (Current Location): 05/15/2020  [facility] and [place] Number:  Producer, television/film/video and Address:  [facility], [address], [place], [place] [address]      Provider Number: [id number]  Attending Physician Name and Address:  McDiarmid, Leighton Roach, MD  Relative Name and Phone Number:  Loura Halt    Current Level of Care: Hospital Recommended Level of Care: Skilled Nursing Facility Prior Approval Number:    Date Approved/Denied:   PASRR Number: [id number]  Discharge Plan: Home    Current Diagnoses: Patient Active Problem List   Diagnosis Date Noted  . Osteomyelitis (HCC) 05/16/2020  . Sepsis (HCC) 05/16/2020  . Dry gangrene (HCC) 05/16/2020  . Hypertension associated with diabetes (HCC) 05/16/2020  . Dyslipidemia 05/16/2020  . Peripheral vascular disease (HCC) 05/16/2020  . Normocytic anemia 05/16/2020  . CKD (chronic kidney disease), stage III 05/16/2020  . Acute kidney injury (HCC) 05/16/2020  . Pain due to onychomycosis of toenails of both feet 03/31/2020  . 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 Traumatic amputation of toe (HCC) 03/31/2020  . Chronic indwelling Foley catheter 02/02/2020    Orientation RESPIRATION BLADDER Height & Weight     Self, Time, Situation, Place  Normal Continent, External catheter Weight: 192 lb 8 oz (87.3 kg) Height:  6\' 1"  (185.4 cm)  BEHAVIORAL SYMPTOMS/MOOD NEUROLOGICAL BOWEL NUTRITION STATUS      Continent Diet (See discharge summary)  AMBULATORY STATUS COMMUNICATION OF NEEDS Skin   Extensive Assist Verbally Normal                       Personal Care Assistance Level of Assistance  Bathing, Feeding, Dressing Bathing Assistance: Maximum assistance Feeding assistance: Limited assistance Dressing Assistance: Maximum assistance     Functional  Limitations Info  Hearing, Speech, Sight Sight Info: Adequate Hearing Info: Adequate Speech Info: Adequate    SPECIAL CARE FACTORS FREQUENCY  PT (By licensed PT), OT (By licensed OT)     PT Frequency: 5x a week OT Frequency: 5x a week            Contractures Contractures Info: Not present    Additional Factors Info  Code Status, Allergies Code Status Info: Full Allergies Info: NKA           Current Medications (05/22/2020):  This is the current hospital active medication list Current Facility-Administered Medications  Medication Dose Route Frequency Provider Last Rate Last Admin  . 0.9 %  sodium chloride infusion   Intravenous Continuous Persons, 05/24/2020, [place]   Stopped at 05/20/20 [phone]  . 0.9 %  sodium chloride infusion  250 mL Intravenous PRN Brimage, Vondra, DO      . acetaminophen (TYLENOL) tablet 1,000 mg  1,000 mg Oral Q8H Persons, 6945, PA   1,000 mg at 05/22/20 05/24/20  . acetaminophen (TYLENOL) tablet 325-650 mg  325-650 mg Oral Q6H PRN Persons, 0388, PA      . amLODipine (NORVASC) tablet 10 mg  10 mg Oral Daily Persons, [place], [place]   10 mg at 05/22/20 0840  . aspirin chewable tablet 81 mg  81 mg Oral Daily Persons, 05/24/20, [place]   81 mg at 05/22/20 0839  . atorvastatin (LIPITOR) tablet 20 mg  20 mg Oral Daily Persons, 05/24/20, [place]   20 mg at 05/22/20 0839  .  benztropine (COGENTIN) tablet 1 mg  1 mg Oral QHS Persons, [place], [place]   1 mg at 05/21/20 2100  . Chlorhexidine Gluconate Cloth 2 % PADS 6 each  6 each Topical Daily Persons, [place], [place]   6 each at 05/21/20 1100  . docusate sodium (COLACE) capsule 100 mg  100 mg Oral BID Persons, [place], PA   100 mg at 05/22/20 0839  . insulin aspart (novoLOG) injection 0-5 Units  0-5 Units Subcutaneous QHS Persons, [place], PA      . insulin aspart (novoLOG) injection 0-9 Units  0-9 Units Subcutaneous TID WC Persons, [place], [place]   2 Units at 05/22/20 0840  . metoCLOPramide (REGLAN) tablet 5-10 mg  5-10 mg  Oral Q8H PRN Persons, [place], PA       Or  . metoCLOPramide (REGLAN) injection 5-10 mg  5-10 mg Intravenous Q8H PRN Persons, [place], PA      . ondansetron [facility]) injection 4 mg  4 mg Intravenous Q6H PRN Persons, [place], PA   4 mg at 05/19/20 1447  . oxyCODONE (Oxy IR/ROXICODONE) immediate release tablet 5 mg  5 mg Oral Q4H PRN Mirian Mo, MD   5 mg at 05/21/20 0933  . sodium chloride flush (NS) 0.9 % injection 3 mL  3 mL Intravenous Once Persons, [place], PA      . sodium chloride flush (NS) 0.9 % injection 3 mL  3 mL Intravenous Q12H Persons, [place], PA   3 mL at 05/22/20 0841  . sodium chloride flush (NS) 0.9 % injection 3 mL  3 mL Intravenous PRN Persons, [place], PA         Discharge Medications: Please see discharge summary for a list of discharge medications.  Relevant Imaging Results:  Relevant Lab Results:   Additional Information SSN [id number]  Dannette Barbara [place], [place]

## 2020-05-23 LAB — BASIC METABOLIC PANEL
Anion gap: 8 (ref 5–15)
BUN: 11 mg/dL (ref 8–23)
CO2: 26 mmol/L (ref 22–32)
Calcium: 9 mg/dL (ref 8.9–10.3)
Chloride: 103 mmol/L (ref 98–111)
Creatinine, Ser: 1.18 mg/dL (ref 0.61–1.24)
GFR calc Af Amer: >60 mL/min (ref >60)
GFR calc non Af Amer: >60 mL/min (ref >60)
Glucose, Bld: 107 mg/dL — ABNORMAL HIGH (ref 70–99)
Potassium: 4.5 mmol/L (ref 3.5–5.1)
Sodium: 137 mmol/L (ref 135–145)

## 2020-05-23 LAB — CBC
HCT: 26.3 % — ABNORMAL LOW (ref 39.0–52.0)
Hemoglobin: 8.6 g/dL — ABNORMAL LOW (ref 13.0–17.0)
MCH: 27.5 pg (ref 26.0–34.0)
MCHC: 32.7 g/dL (ref 30.0–36.0)
MCV: 84 fL (ref 80.0–100.0)
Platelets: 389 10*3/uL (ref 150–400)
RBC: 3.13 MIL/uL — ABNORMAL LOW (ref 4.22–5.81)
RDW: 14.3 % (ref 11.5–15.5)
WBC: 11.9 10*3/uL — ABNORMAL HIGH (ref 4.0–10.5)
nRBC: 0 % (ref 0.0–0.2)

## 2020-05-23 LAB — GLUCOSE, CAPILLARY
Glucose-Capillary: 107 mg/dL — ABNORMAL HIGH (ref 70–99)
Glucose-Capillary: 146 mg/dL — ABNORMAL HIGH (ref 70–99)

## 2020-05-23 LAB — SARS CORONAVIRUS 2 (TAT 6-24 HRS): SARS Coronavirus 2: NEGATIVE

## 2020-05-23 MED ORDER — ATORVASTATIN CALCIUM 20 MG PO TABS: 20.0000 mg | ORAL_TABLET | Freq: Every day | ORAL | 0 refills | Status: DC

## 2020-05-23 MED ORDER — ASPIRIN 81 MG PO CHEW: 81.0000 mg | CHEWABLE_TABLET | Freq: Every day | ORAL | Status: DC

## 2020-05-23 NOTE — Progress Notes (Signed)
Called [place] in an attempt to give  report but nobody picked up phone  .

## 2020-05-23 NOTE — Progress Notes (Signed)
Family Medicine Teaching Service Daily Progress Note Intern Pager: [phone]  Patient name: Ricky Hanson Medical record number: [id number] Date of birth: [birth year] Age: 69 y.o. Gender: male  Primary Care Provider: Hoy Register, MD Consultants: Orthopedics, vascular, cardiology Code Status: Full code  Pt Overview and Major Events to Date:  6/22-patient admitted for gangrene of second toe on right lower extremity 6/25-BKA on the right  Assessment and Plan: Ricky Hanson is a 69 y.o. male presenting with infection of right second toe. PMH is significant for DM  Right foot osteomyelitis   Sepsis Postop day 3.  Status post right BKA.  Patient reports pain is well controlled this morning.  He is more conversive than he has been on previous mornings.  Patient's leukocytosis is essentially stable at 11.9 from 11.2 yesterday.  He has not used any as needed oxycodone so far today. -Ortho following, appreciate recommendations -Cardiology signed off, appreciate recommendations -Antibiotics discontinued 6/27 -One half maintenance IV fluids -Scheduled Tylenol 1000 mg every 8 hours -Discontinue Oxy 5 milligrams every 4 hours given patient is well controlled pain -Reglan every 8 hours as needed -Patient is medically stable and ready for SNF  Peripheral vascular disease Patient received transtibial amputation yesterday.  Dr. Jacinto Halim, cardiologist, follow-up with patient outpatient for risk modification.   -Cardiology signed off, patient recommendations -Aspirin 81 mg and rivaroxaban per cardiology -Peripheral arteriogram showing occlusion of 3 vessels in proximal calf. -S/p BKA on 6/25  Hypertension Blood pressures have ranged from 117/45-133/55 over the last 24 hours.  Medications include amlodipine 10 mg daily, lisinopril 10 mg daily.  Patient's cousin helps with his medications and reports good adherence. -Continue home amlodipine and lisinopril -Vitals per  protocol  Hyperlipidemia -Atorvastatin increased to 20 mg daily on admission  Prediabetes Glucose 154 this morning.  Patient received 12 units sliding scale insulin yesterday.  Patient's hemoglobin A1c in March 2021 was 5.5. -Sliding scale insulin   AKI on CKD 3A Resolved.  Creatinine 1.81 on admission.  Creatinine is stable at 1.33 this morning. -Continue IV hydration -Morning BMPs -Avoid nephrotoxic agents  Normocytic anemia   iron deficiency anemia Hemoglobin mildly decreased at 8.0 from 7.8 yesterday.   -Consider Feraheme -AM CBCs   Chronic indwelling Foley Urine culture grew multiple species.  Hx of presumed schizophrenia Patient received postprocedural dose of Haloperidol 100 mg 6/25  FEN/GI: Carb modified diet, replete electrolytes as needed PPx: Lovenox  Disposition: Patient is medically stable and ready for SNF  Subjective:  Patient doing well this morning.  Reports he wants to eat his breakfast but has no other concerns at this time.  Agreeable to go to SNF. Objective: Temp:  [98 F (36.7 C)-98.5 F (36.9 C)] 98.3 F (36.8 C) (06/29 0513) Pulse Rate:  [68-82] 68 (06/29 0513) Resp:  [13-18] 13 (06/29 0513) BP: (117-144)/(45-64) 144/56 (06/29 0513) SpO2:  [98 %-100 %] 100 % (06/29 0513) Weight:  [84.3 kg] 84.3 kg (06/29 0513)  Physical Exam: GEN: Lying in bed, appears to have just woken up when we entered the room, breakfast is ready for him CV: Regular rate and rhythm, no murmurs appreciated RESP: Normal work of breathing, lungs are clear to auscultation bilaterally  ABD: Abdomen is soft, nontender, positive bowel sounds MSK: S/p right BKA with protector in place, wound VAC in place.  No increased warmth or edema in left lower extremity  SKIN: warm, dry    Laboratory: Recent Labs  Lab 05/20/20 0346 05/21/20 0228 05/22/20 0310  WBC 11.3* 12.2*  11.2*  HGB 8.2* 7.8* 8.0*  HCT 24.8* 24.1* 24.2*  PLT 317 348 360   Recent Labs  Lab 05/20/20 0346  05/21/20 0228 05/22/20 0310  NA 135 134* 136  K 3.9 4.1 4.4  CL 104 105 105  CO2 21* 23 24  BUN 7* 7* 13  CREATININE 1.01 1.27* 1.33*  CALCIUM 8.3* 8.4* 8.6*  GLUCOSE 154* 128* 140*     Imaging/Diagnostic Tests: No results found.   Derrel Nip, MD 05/23/2020, 5:50 AM PGY-1, [facility] Family Medicine FPTS Intern pager: [phone], text pages welcome

## 2020-05-23 NOTE — Care Management Important Message (Signed)
Important Message  Patient Details  Name: Radin Raptis MRN: [id number] Date of Birth: December 02, 1950   Medicare Important Message Given:  Yes     Renie Ora 05/23/2020, 10:30 AM

## 2020-05-23 NOTE — Discharge Instructions (Signed)
You were admitted for having a infection of your right leg.  You were given antibiotics and evaluated by a vascular surgeon as well as an orthopedic surgeon.  It was determined that your right foot would not heal given poor blood supply so an amputation was performed.  You are being discharged with a wound VAC in place around where the procedure occurred.  You will need to leave this on until your follow-up visit with the orthopedic surgeon.  They should call and schedule an appointment with you but if you have not heard anything by Thursday you should reach out to Ricky Hanson office to schedule a follow-up appointment.  If you notice any warmth, redness, increased pain, drainage around the surgical site please seek medical attention.

## 2020-05-23 NOTE — Progress Notes (Signed)
Physical Therapy Treatment Patient Details Name: Ricky Hanson MRN: [id number] DOB: 1951-03-02 Today's Date: 05/23/2020    History of Present Illness Ricky Hanson is a 69 y.o. male presenting with infection of right second toe. Pt underwent right BKA. PMH is significant for DM    PT Comments    Pt is supine in bed on entry, with increased encouragement pt agreeable to getting up to recliner. Pt is limited in safe mobility by slow processing, difficulty sequencing, and generalized weakness. Pt requires min A for bed mobility and maxAx2 for squat pivot transfer to recliner. Pt is hopeful for d/c to [facility] this afternoon.    Follow Up Recommendations  SNF;Supervision/Assistance - 24 hour     Equipment Recommendations  Other (comment);Wheelchair (measurements PT);Wheelchair cushion (measurements PT) (TBA)       Precautions / Restrictions Precautions Precautions: Fall Precaution Comments: VAC on right residual limb Required Braces or Orthoses: Other Brace Other Brace: Limb guard right Residual limb Restrictions RLE Weight Bearing: Non weight bearing    Mobility  Bed Mobility Overal bed mobility: Needs Assistance Bed Mobility: Supine to Sit     Supine to sit: Min assist;HOB elevated     General bed mobility comments: requires increased cuing to start movement to EoB, heavy use of rail and minA for pad scoot of hips to EoB  Transfers Overall transfer level: Needs assistance   Transfers: Sit to/from [employer] Transfers     Squat pivot transfers: Max assist;+2 physical assistance;+2 safety/equipment     General transfer comment: attempted to stand in [place] however residual limb guard to long to [employer] equipment. MaxAx2 for pivot transfer to recliner on his L.   Ambulation/Gait             General Gait Details: unable to progress today.          Balance Overall balance assessment: Needs assistance Sitting-balance support: No upper extremity  supported;Feet supported Sitting balance-Leahy Scale: Fair     Standing balance support: Bilateral upper extremity supported;During functional activity Standing balance-Leahy Scale: Zero Standing balance comment: could not achieve full upright stance.                             Cognition Arousal/Alertness: Awake/alert Behavior During Therapy: Flat affect Overall Cognitive Status: No family/caregiver present to determine baseline cognitive functioning                       Memory: Decreased recall of precautions;Decreased short-term memory Following Commands: Follows one step commands inconsistently;Follows one step commands with increased time Safety/Judgement: Decreased awareness of safety;Decreased awareness of deficits   Problem Solving: Slow processing;Decreased initiation;Difficulty sequencing;Requires verbal cues;Requires tactile cues General Comments: requires increased cuing and time for processing         General Comments General comments (skin integrity, edema, etc.): VSS      Pertinent Vitals/Pain Pain Assessment: Faces Faces Pain Scale: Hurts a little bit Pain Location: right LE Pain Descriptors / Indicators: Aching;Grimacing;Guarding;Discomfort Pain Intervention(s): Limited activity within patient's tolerance;Monitored during session;Repositioned           PT Goals (current goals can now be found in the care plan section) Acute Rehab PT Goals Patient Stated Goal: to get better and go home PT Goal Formulation: With patient Time For Goal Achievement: 06/04/20 Potential to Achieve Goals: Good Progress towards PT goals: Progressing toward goals    Frequency    Min 3X/week  PT Plan Current plan remains appropriate    Co-evaluation PT/OT/SLP Co-Evaluation/Treatment: Yes            AM-PAC PT "6 Clicks" Mobility   Outcome Measure  Help needed turning from your back to your side while in a flat bed without using  bedrails?: A Lot Help needed moving from lying on your back to sitting on the side of a flat bed without using bedrails?: A Lot Help needed moving to and from a bed to a chair (including a wheelchair)?: Total Help needed standing up from a chair using your arms (e.g., wheelchair or bedside chair)?: Total Help needed to walk in hospital room?: Total Help needed climbing 3-5 steps with a railing? : Total 6 Click Score: 8    End of Session Equipment Utilized During Treatment: Gait belt Activity Tolerance: Patient limited by fatigue Patient left: in chair;with call bell/phone within reach;with chair alarm set Nurse Communication: Mobility status PT Visit Diagnosis: Unsteadiness on feet (R26.81);Muscle weakness (generalized) (M62.81)     Time: 0258-5277 PT Time Calculation (min) (ACUTE ONLY): 20 min  Charges:  $Therapeutic Activity: 8-22 mins                     Deitra Craine B. Beverely Risen PT, DPT Acute Rehabilitation Services Pager [phone] Office [phone]    Elon Alas Fleet 05/23/2020, 3:59 PM

## 2020-05-23 NOTE — Discharge Summary (Signed)
Family Medicine Teaching [facility] Discharge Summary  Patient name: Ricky Hanson Medical record number: [id number] Date of birth: 05-09-1951 Age: 69 y.o. 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  Disposition: SNF  Discharge Condition: Stable, improved, s/p right BKA  Discharge Exam:  Temp:  [98 F (36.7 C)-98.5 F (36.9 C)] 98.3 F (36.8 C) (06/29 0513) Pulse Rate:  [68-82] 68 (06/29 0513) Resp:  [13-18] 13 (06/29 0513) BP: (117-144)/(45-64) 144/56 (06/29 0513) SpO2:  [98 %-100 %] 100 % (06/29 0513) Weight:  [84.3 kg] 84.3 kg (06/29 0513)  Physical Exam: GEN: Lying in bed, appears to have just woken up when we entered the room, breakfast is ready for him CV: Regular rate and rhythm, no murmurs appreciated RESP: Normal work of breathing, lungs are clear to auscultation bilaterally  ABD: Abdomen is soft, nontender, positive bowel sounds MSK: S/p right BKA with protector in place, wound VAC in place.  No increased warmth or edema in left lower extremity  SKIN: warm, dry  Brief Hospital Course:  Ricky Hanson is a 69 y.o. male presenting with infection of right second toe. PMH is significant for DM, peripheral vascular disease, hypertension, hyperlipidemia, prediabetes, CKD 3A, normocytic anemia and, chronic indwelling Foley.  Below is his hospital course by problem.  Right foot osteomyelitis   sepsis Patient presented with sepsis due to right second toe infection and wet gangrene.  Patient's WBC was  elevated on admission and he was started on vancomycin, cefepime, Zosyn.  Patient was followed by Dr. Loreta Ave and so was initially seen by him.  After conversations with vascular, orthopedic surgery, and Dr. Jacinto Halim the decision was made to have Dr. Jacinto Halim perform an arteriogram to determine if there is any ability to salvage the patient's foot.  The results showed that this was not possible on it was recommended that he undergo a transtibial amputation.  Dr. Lajoyce Corners performed the transtibial amputation on 6/25.  Antibiotics were continued for 24 hours after the procedure and then discontinued.  A wound VAC was placed after the procedure and per surgery should remain on for 1 week.  Patient was instructed to follow-up with orthopedic surgery 1 week after the procedure.  AKI on CKD 3A Patient presented with a creatinine of 1.81.  He was hydrated and his infection was treated.  His creatinines were trended and his AKI resolved.  Creatinine on the day of discharge was 1.18.  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  His next dose was due on 625 so this was ordered and he was given it.  He will need his next dose on 7/25.   Issues for Follow Up:  1. Follow-up with orthopedics regarding wound management, leave wound VAC on until that visit which should be scheduled for around 7/2. 2. Ensure proper psych medication compliance with [facility]  Significant Procedures: 6/23-echocardiogram 6/25-right lower extremity arteriogram followed by BKA  Significant Labs and Imaging:  Recent Labs  Lab 05/21/20 0228 05/22/20 0310 05/23/20 0604  WBC 12.2* 11.2* 11.9*  HGB 7.8* 8.0* 8.6*  HCT 24.1*  24.2* 26.3*  PLT 348 360 389   Recent Labs  Lab 05/19/20 0616 05/19/20 0616 05/20/20 0346 05/20/20 0346 05/21/20 0228 05/21/20 0228 05/22/20 0310 05/23/20 0604  NA 134*  --  135  --  134*  --  136 137  K  3.7   < > 3.9   < > 4.1   < > 4.4 4.5  CL 105  --  104  --  105  --  105 103  CO2 20*  --  21*  --  23  --  24 26  GLUCOSE 138*  --  154*  --  128*  --  140* 107*  BUN 5*  --  7*  --  7*  --  13 11  CREATININE 1.17  --  1.01  --  1.27*  --  1.33* 1.18  CALCIUM 8.7*  --  8.3*  --  8.4*  --  8.6* 9.0   < > = values in this interval not displayed.   DG Chest 1 View  Result Date: 05/16/2020 CLINICAL DATA:  Shortness of breath. EXAM: CHEST  1 VIEW COMPARISON:  None. FINDINGS: There is no evidence of acute infiltrate, pleural effusion or pneumothorax. The heart size and mediastinal contours are within normal limits. There is mild calcification of the aortic arch. The visualized skeletal structures are unremarkable. IMPRESSION: No active disease. Electronically Signed   By: Aram Candela M.D.   On: 05/16/2020 16:36   PERIPHERAL VASCULAR CATHETERIZATION  Result Date: 05/17/2020  Prox R Popliteal to Mid R Popliteal lesion is 70% stenosed.  Mid R ATA to Dist R ATA lesion is 100% stenosed.  Prox R TP Trunk to Dist R TP Trunk lesion is 100% stenosed.  Ost R PTA to Prox R PTA lesion is 100% stenosed.  Mid R Peroneal to Dist R Peroneal lesion is 100% stenosed.  Abdominal aortogram and limited bifemoral arteriogram and right femoral arteriogram with distal runoff 05/17/2020: Abdominal aorta is widely patent, 2 renal arteries 1 on either sides widely patent.  Minimal atherosclerotic changes noted.  Bilateral aortoiliac vessels are widely patent. Right femoral artery is widely patent.  Right superficial femoral artery has mild disease throughout.  Right distal popliteal artery has a 70 to 75% stenosis with brisk flow. Right anterior tibial artery occludes in the mid segment and reconstitutes just above the right ankle, extensive collaterals are noted from below. Right TP trunk is occluded.  Peroneal artery is diffusely diseased and is occluded in the proximal segment.  Posterior tibial artery reconstitutes at  the level of the left mid calf.  The collateralized anterior tibial and posterior tibial arteries do form pedal arch with brisk flow. Interventional data: Unsuccessful attempt at both antegrade and retrograde recanalization of CTO right TP trunk and posterior tibial artery. Extensive collaterals are evident and we could certainly attempt transmetatarsal amputation and if the wound does not heal, we could certainly reattempt revascularization of the right PT now that we know the anatomy and possibly right AT/DP.  Right AT appears to be difficult in view of extensive collateralization even with pedal access. 130 mL contrast utilized.  Patient tolerated the procedure well.  Access closed with minx on the left, with excellent hemostasis.  Manual pressure held for the right posterior tibial access.   DG Foot Complete Right  Result Date: 05/17/2020 Please see detailed radiograph report in office note.  DG Foot Complete Right  Result Date: 05/16/2020 CLINICAL DATA:  Foot pain EXAM: RIGHT FOOT COMPLETE -  3+ VIEW COMPARISON:  04/16/2020 FINDINGS: Postsurgical changes are noted with amputation of the first digit. Soft tissue swelling and subcutaneous air is noted in the second digit consistent with localized infection. There has been interval bony destruction at the distal aspect of the second proximal phalanx. Persistent resorption at the phalangeal tuft of the second digit is noted. These changes are consistent with osteomyelitis. IMPRESSION: Progressive soft tissue swelling, subcutaneous air and bony destruction in the second digit as described. These changes are consistent with cellulitis and underlying osteomyelitis. Electronically Signed   By: Alcide Clever M.D.   On: 05/16/2020 08:44   ECHOCARDIOGRAM COMPLETE  Result Date: 05/17/2020    ECHOCARDIOGRAM REPORT   Patient Name:   REYNALD WOODS Date of Exam: 05/17/2020 Medical Rec #:  [id number]      Height:       73.0 in Accession #:    [id number]     Weight:        200.0 lb Date of Birth:  04-23-1951      BSA:          2.152 m Patient Age:    68 years       BP:           100/52 mmHg Patient Gender: M              HR:           65 bpm. Exam Location:  Inpatient Procedure: 2D Echo, Cardiac Doppler and Color Doppler Indications:    R01.1 Murmur  History:        Patient has no prior history of Echocardiogram examinations.  Sonographer:    Tiffany Dance Referring Phys: [id number] CARINA M BROWN IMPRESSIONS  1. Left ventricular ejection fraction, by estimation, is 65 to 70%. The left ventricle has normal function. The left ventricle has no regional wall motion abnormalities. There is mild asymmetric left ventricular hypertrophy. Left ventricular diastolic parameters were normal.  2. Right ventricular systolic function is normal. The right ventricular size is normal.  3. The mitral valve is normal in structure. Trivial mitral valve regurgitation. No evidence of mitral stenosis.  4. The aortic valve is normal in structure. Aortic valve regurgitation is not visualized. No aortic stenosis is present. FINDINGS  Left Ventricle: Left ventricular ejection fraction, by estimation, is 65 to 70%. The left ventricle has normal function. The left ventricle has no regional wall motion abnormalities. The left ventricular internal cavity size was normal in size. There is  mild asymmetric left ventricular hypertrophy. Left ventricular diastolic parameters were normal. Right Ventricle: The right ventricular size is normal. No increase in right ventricular wall thickness. Right ventricular systolic function is normal. Left Atrium: Left atrial size was normal in size. Right Atrium: Right atrial size was normal in size. Pericardium: There is no evidence of pericardial effusion. Mitral Valve: The mitral valve is normal in structure. Trivial mitral valve regurgitation. No evidence of mitral valve stenosis. Tricuspid Valve: The tricuspid valve is normal in structure. Tricuspid valve regurgitation is not  demonstrated. No evidence of tricuspid stenosis. Aortic Valve: The aortic valve is normal in structure. Aortic valve regurgitation is not visualized. No aortic stenosis is present. Pulmonic Valve: The pulmonic valve was normal in structure. Pulmonic valve regurgitation is not visualized. No evidence of pulmonic stenosis. Aorta: The aortic root and ascending aorta are structurally normal, with no evidence of dilitation. IAS/Shunts: The atrial septum is grossly normal.  LEFT VENTRICLE PLAX 2D LVIDd:  4.20 cm  Diastology LVIDs:         2.60 cm  LV e' lateral:   8.49 cm/s LV PW:         1.30 cm  LV E/e' lateral: 13.3 LV IVS:        1.10 cm  LV e' medial:    6.53 cm/s LVOT diam:     2.30 cm  LV E/e' medial:  17.3 LV SV:         98 LV SV Index:   46 LVOT Area:     4.15 cm  RIGHT VENTRICLE             IVC RV Basal diam:  2.20 cm     IVC diam: 1.60 cm RV S prime:     11.85 cm/s TAPSE (M-mode): 2.3 cm LEFT ATRIUM             Index       RIGHT ATRIUM           Index LA diam:        3.40 cm 1.58 cm/m  RA Area:     12.30 cm LA Vol (A2C):   72.1 ml 33.51 ml/m RA Volume:   24.40 ml  11.34 ml/m LA Vol (A4C):   39.6 ml 18.40 ml/m LA Biplane Vol: 55.3 ml 25.70 ml/m  AORTIC VALVE LVOT Vmax:   107.00 cm/s LVOT Vmean:  66.200 cm/s LVOT VTI:    0.237 m  AORTA Ao Root diam: 3.80 cm Ao Asc diam:  3.60 cm MITRAL VALVE MV Area (PHT): 3.65 cm     SHUNTS MV Decel Time: 208 msec     Systemic VTI:  0.24 m MV E velocity: 113.00 cm/s  Systemic Diam: 2.30 cm MV A velocity: 118.00 cm/s MV E/A ratio:  0.96 Kristeen Miss MD Electronically signed by Kristeen Miss MD Signature Date/Time: 05/17/2020/12:58:52 PM    Final    VAS [place] LOWER EXTREMITY VENOUS (DVT)  Result Date: 05/18/2020  Lower Venous DVTStudy Indications: Edema, Swelling, and RT cellulitis, osteomyelitis, and gangrene.  Limitations: Poor ultrasound/tissue interface. Comparison Study: No prior studies. Performing Technologist: Jean Rosenthal  Examination Guidelines: A  complete evaluation includes B-mode imaging, spectral Doppler, color Doppler, and power Doppler as needed of all accessible portions of each vessel. Bilateral testing is considered an integral part of a complete examination. Limited examinations for reoccurring indications may be performed as noted. The reflux portion of the exam is performed with the patient in reverse Trendelenburg.  +---------+---------------+---------+-----------+----------+------------------+  RIGHT     Compressibility Phasicity Spontaneity Properties Thrombus Aging      +---------+---------------+---------+-----------+----------+------------------+  CFV       Full            Yes       Yes                                        +---------+---------------+---------+-----------+----------+------------------+  SFJ       Full                                                                 +---------+---------------+---------+-----------+----------+------------------+  FV Prox   Full                                                                 +---------+---------------+---------+-----------+----------+------------------+  FV Mid    Full                                                                 +---------+---------------+---------+-----------+----------+------------------+  FV Distal Full                                                                 +---------+---------------+---------+-----------+----------+------------------+  PFV       Full                                                                 +---------+---------------+---------+-----------+----------+------------------+  POP       Full            Yes       Yes                                        +---------+---------------+---------+-----------+----------+------------------+  PTV       Full                                                                 +---------+---------------+---------+-----------+----------+------------------+  PERO      Full                                              Limited                                                                         visualization       +---------+---------------+---------+-----------+----------+------------------+   +----+---------------+---------+-----------+----------+--------------+  LEFT Compressibility Phasicity Spontaneity Properties Thrombus Aging  +----+---------------+---------+-----------+----------+--------------+  CFV  Full            Yes       Yes                                    +----+---------------+---------+-----------+----------+--------------+     Summary: RIGHT: - There is no evidence of deep vein thrombosis in the lower extremity. However, portions of this examination were limited- see technologist comments above.  - No cystic structure found in the  popliteal fossa.  LEFT: - No evidence of common femoral vein obstruction.  *See table(s) above for measurements and observations. Electronically signed by Lemar Livings MD on 05/18/2020 at 9:03:29 PM.    Final      Results/Tests Pending at Time of Discharge: none  Discharge Medications:  Allergies as of 05/23/2020   No Known Allergies     Medication List    TAKE these medications   acetaminophen 500 MG tablet Commonly known as: TYLENOL Take 1,000 mg by mouth every 6 (six) hours as needed for mild pain.   amLODipine 10 MG tablet Commonly known as: NORVASC Take 10 mg by mouth daily.   aspirin 81 MG chewable tablet Chew 1 tablet (81 mg total) by mouth daily. Start taking on: May 24, 2020   atorvastatin 20 MG tablet Commonly known as: LIPITOR Take 1 tablet (20 mg total) by mouth daily. Start taking on: May 24, 2020 What changed:   medication strength  how much to take   benztropine 1 MG tablet Commonly known as: COGENTIN Take 1 mg by mouth at bedtime.   haloperidol decanoate 100 MG/ML injection Commonly known as: HALDOL DECANOATE Inject 100 mg into the muscle every 28 (twenty-eight) days. Inject 2 mLs every 4 weeks    lisinopril 10 MG tablet Commonly known as: ZESTRIL Take 10 mg by mouth daily.            Discharge Care Instructions  (From admission, onward)         Start     Ordered   05/23/20 0000  Leave dressing on - Keep it clean, dry, and intact until clinic visit        05/23/20 1447          Discharge Instructions: Please refer to Patient Instructions section of EMR for full details.  Patient was counseled important signs and symptoms that should prompt return to medical care, changes in medications, dietary instructions, activity restrictions, and follow up appointments.   Follow-Up Appointments:  Follow-up Information    [facility], [place], [place] In 1 week.   Specialty: Orthopedic Surgery Contact information: [address] [place] [address] [phone]        Yates Decamp, MD Follow up.   Specialty: Cardiology Contact information: [address] [place] [address] [phone]               Derrel Nip, MD 05/23/2020, 2:53 PM PGY-1, [facility] Family Medicine

## 2020-05-23 NOTE — TOC Progression Note (Addendum)
Transition of Care [facility]) - Progression Note    Patient Details  Name: Ricky Hanson MRN: [id number] Date of Birth: Apr 03, 1951  [facility]) CM/SW Contact  Jimmy Picket, [place] Phone Number: 05/23/2020, 1:03 PM  Clinical Narrative:     CSW spoke to pts son Ovide via phone. Nester chose bed at [facility]. CSW contacted [facility] and pts auth numbers are [id number] and is good for dates 6/29- 7/1.   CSW called [facility] to inquire about availability for d/c today. [facility] is able to accept pt today. Pts covid test is pending at this time.   Expected Discharge Plan: Skilled Nursing Facility Barriers to Discharge: Insurance Authorization  Expected Discharge Plan and Services Expected Discharge Plan: Skilled Nursing Facility In-house Referral: NA Discharge Planning Services: CM Consult Post Acute Care Choice: Home Health Living arrangements for the past 2 months: Single Family Home                                       Social Determinants of Health (SDOH) Interventions    Readmission Risk Interventions No flowsheet data found.   Jimmy Picket, Theresia Majors, [place] Clinical Social Worker [phone]

## 2020-05-23 NOTE — TOC Transition Note (Signed)
Transition of Care [facility]) - CM/SW Discharge Note   Patient Details  Name: Ricky Hanson MRN: [id number] Date of Birth: 31-Aug-1951  Transition of Care [facility]) CM/SW Contact:  Jimmy Picket, [place] Phone Number: 05/23/2020, 4:20 PM   Clinical Narrative:     CSW will be discharging to [facility] via [facility]. PT and his son mark was notified.   Nurse can call report to [phone].  Final next level of care: Skilled Nursing Facility Barriers to Discharge: Barriers Resolved   Patient Goals and CMS Choice   CMS Medicare.gov Compare Post Acute Care list provided to:: Patient Choice offered to / list presented to : Patient  Discharge Placement              Patient chooses bed at: [facility] Patient to be transferred to facility by: ptar Name of family member notified: Loura Halt [phone] Patient and family notified of of transfer: 05/23/20  Discharge Plan and Services In-house Referral: NA Discharge Planning Services: CM Consult Post Acute Care Choice: Home Health                               Social Determinants of Health (SDOH) Interventions     Readmission Risk Interventions No flowsheet data found.   Jimmy Picket, Theresia Majors, [place] Clinical Social Worker [phone]

## 2020-05-31 ENCOUNTER — Telehealth: Payer: Self-pay | Admitting: Physician Assistant

## 2020-05-31 ENCOUNTER — Encounter: Payer: Self-pay | Admitting: Physician Assistant

## 2020-05-31 ENCOUNTER — Ambulatory Visit (INDEPENDENT_AMBULATORY_CARE_PROVIDER_SITE_OTHER): Payer: Medicare Other | Admitting: Physician Assistant

## 2020-05-31 VITALS — Ht 73.0 in | Wt 185.8 lb

## 2020-05-31 DIAGNOSIS — S88111A Complete traumatic amputation at level between knee and ankle, right lower leg, initial encounter: Secondary | ICD-10-CM

## 2020-05-31 DIAGNOSIS — Z89511 Acquired absence of right leg below knee: Secondary | ICD-10-CM

## 2020-05-31 NOTE — Progress Notes (Signed)
Office Visit Note   Patient: Ricky Hanson           Date of Birth: 08/23/1951           MRN: [id number] Visit Date: 05/31/2020              Requested by: Hoy Register, MD [address] [place],  [place] [address] PCP: Hoy Register, MD  Chief Complaint  Patient presents with  . Right Leg - Routine Post Op    Right BKA      HPI: Patient is almost 2 weeks status post right below-knee amputation.  Overall he is doing okay.  He is at a nursing facility.  Wound VAC was removed today  Assessment & Plan: Visit Diagnoses: No diagnosis found.  Plan: He is doing quite well.  May begin cleansing with Dial soap and water.  Should apply a new shrinker next to the skin and change this daily.  Follow-up in 1 week.  Patient is wearing a [employer] limb protector  Follow-Up Instructions: No follow-ups on file.   Ortho Exam  Patient is alert, oriented, no adenopathy, well-dressed, normal affect, normal respiratory effort. Focused examination demonstrates well approximated wound edges at the amputation stump.  There is a minimal amount of bleeding.  There is no areas of necrosis or dehiscence.  Overall swelling is well controlled.  No cellulitis or signs of infection  Imaging: No results found. No images are attached to the encounter.  Labs: Lab Results  Component Value Date   HGBA1C 5.5 02/02/2020   REPTSTATUS 05/17/2020 FINAL 05/16/2020   CULT MULTIPLE SPECIES PRESENT, SUGGEST RECOLLECTION (A) 05/16/2020     Lab Results  Component Value Date   ALBUMIN 3.0 (L) 05/15/2020   ALBUMIN 4.1 02/02/2020    No results found for: MG No results found for: VD25OH  No results found for: PREALBUMIN CBC EXTENDED Latest Ref Rng & Units 05/23/2020 05/22/2020 05/21/2020  WBC 4.0 - 10.5 K/uL 11.9(H) 11.2(H) 12.2(H)  RBC 4.22 - 5.81 MIL/uL 3.13(L) 2.93(L) 2.88(L)  HGB 13.0 - 17.0 g/dL 9.3(Y) 1.0(F) 7.8(L)  HCT 39 - 52 % 26.3(L) 24.2(L) 24.1(L)  PLT 150 - 400 K/uL 389 360 348  NEUTROABS  1.7 - 7.7 K/uL - - -  LYMPHSABS 0.7 - 4.0 K/uL - - -     Body mass index is 24.52 kg/m.  Orders:  No orders of the defined types were placed in this encounter.  No orders of the defined types were placed in this encounter.    Procedures: No procedures performed  Clinical Data: No additional findings.  ROS:  All other systems negative, except as noted in the HPI. Review of Systems  Objective: Vital Signs: Ht 6\' 1"  (1.854 m)   Wt 185 lb 13.6 oz (84.3 kg)   BMI 24.52 kg/m   Specialty Comments:  No specialty comments available.  PMFS History: Patient Active Problem List   Diagnosis Date Noted  . Osteomyelitis (HCC) 05/16/2020  . Sepsis (HCC) 05/16/2020  . Dry gangrene (HCC) 05/16/2020  . Hypertension associated with diabetes (HCC) 05/16/2020  . Dyslipidemia 05/16/2020  . Peripheral vascular disease (HCC) 05/16/2020  . Normocytic anemia 05/16/2020  . CKD (chronic kidney disease), stage III 05/16/2020  . Acute kidney injury (HCC) 05/16/2020  . Pain due to onychomycosis of toenails of both feet 03/31/2020  . 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 Traumatic amputation of toe (HCC) 03/31/2020  . Chronic indwelling Foley catheter 02/02/2020   Past Medical History:  Diagnosis  Date  . Elevated random blood glucose level   . Foley catheter in place   . Hyperlipidemia   . Hypertension   . Schizophrenia (HCC)     No family history on file.  Past Surgical History:  Procedure Laterality Date  . ABDOMINAL AORTOGRAM W/LOWER EXTREMITY N/A 05/17/2020   Procedure: ABDOMINAL AORTOGRAM W/LOWER EXTREMITY;  Surgeon: Yates Decamp, MD;  Location: [facility] INVASIVE CV LAB;  Service: Cardiovascular;  Laterality: N/A;  . AMPUTATION Right 05/19/2020   Procedure: RIGHT BELOW KNEE AMPUTATION;  Surgeon: Nadara Mustard, MD;  Location: [facility] OR;  Service: Orthopedics;  Laterality: Right;  . PERIPHERAL VASCULAR BALLOON ANGIOPLASTY  05/17/2020   Procedure: PERIPHERAL VASCULAR BALLOON ANGIOPLASTY;   Surgeon: Yates Decamp, MD;  Location: [facility] INVASIVE CV LAB;  Service: Cardiovascular;;  Right PT  . urologic procedure after trauma     Social History   Occupational History  . Not on file  Tobacco Use  . Smoking status: Current Every Day Smoker    Packs/day: 0.50    Types: Cigarettes  . Smokeless tobacco: Never Used  Substance and Sexual Activity  . Alcohol use: Never  . Drug use: Never  . Sexual activity: Not on file

## 2020-05-31 NOTE — Telephone Encounter (Addendum)
Renita with [employer] called asked if patient can get another shrinker? Renita asked if the brace can be taken off or does it need to stay on. Renita also advised the therapist wanted to know if (PT) is needed? Also can notes be faxed over. The fax# is [phone]

## 2020-06-01 NOTE — Telephone Encounter (Signed)
Patient's last office note and letter was faxed to listed number at [phone] attention [facility].

## 2020-06-05 ENCOUNTER — Encounter: Payer: Self-pay | Admitting: Cardiology

## 2020-06-05 ENCOUNTER — Ambulatory Visit: Payer: Medicare Other | Admitting: Cardiology

## 2020-06-05 ENCOUNTER — Other Ambulatory Visit: Payer: Self-pay

## 2020-06-05 VITALS — BP 105/49 | HR 74 | Ht 73.0 in | Wt 191.0 lb

## 2020-06-05 DIAGNOSIS — Z720 Tobacco use: Secondary | ICD-10-CM

## 2020-06-05 DIAGNOSIS — Z899 Acquired absence of limb, unspecified: Secondary | ICD-10-CM

## 2020-06-05 DIAGNOSIS — E782 Mixed hyperlipidemia: Secondary | ICD-10-CM

## 2020-06-05 DIAGNOSIS — I10 Essential (primary) hypertension: Secondary | ICD-10-CM

## 2020-06-05 DIAGNOSIS — I739 Peripheral vascular disease, unspecified: Secondary | ICD-10-CM

## 2020-06-05 NOTE — Progress Notes (Signed)
Ricky Hanson Date of Birth: 06-26-1951 MRN: [id number] Primary Care Provider:Newlin, Odette Horns, MD Former Cardiology Providers: Dr. Yates Decamp  Primary Cardiologist: Tessa Lerner, DO, [facility] (established care 06/05/2020)  Date: 06/05/20 Last Visit: Hospital consult 05/16/2020  Chief Complaint  Patient presents with  . PAD  . Follow-up    HPI  Ricky Hanson is a 69 y.o.  male who presents to the office with a chief complaint of " PAD follow-up." Patient's past medical history and cardiovascular risk factors include: hypertension, hyperlipidemia, diabetes mellitus, tobacco use disorder, peripheral artery disease status post right transtibial amputation secondary to gangrene of the right foot with complete occlusion of 3 vessels of the foot.  Patient presents to the office in the presence of his medical transporter.  He provides verbal consent in regards to discussing his medical condition in her presence.  Patient was recently seen at [facility] by my partner Dr. Yates Decamp given his peripheral vascular disease.  He was asked to follow-up as outpatient for further cardiovascular risk stratification.  In June 2021 patient was found to have gangrene of the right foot with complete occlusion of 3 vessels and therefore underwent a right transtibial amputation.  Thereafter he went to rehab.  Patient denies any chest pain or anginal equivalent.  He states that his right lower extremity is healing well.  He follows up with Dr. Lajoyce Corners.  Unfortunately, he continues to smoke 8 to 9 cigarettes/day.  His functional capacity has been reduced since surgery and he is predominantly in a wheelchair.  But is able to walk short distances to and from the bathroom during which time he does not complain of claudication.  FUNCTIONAL STATUS: Lives with his cousin and is mostly wheelchair bound with minimal ambulation.    ALLERGIES: No Known Allergies   MEDICATION LIST PRIOR TO VISIT: Current Outpatient  Medications on File Prior to Visit  Medication Sig Dispense Refill  . acetaminophen (TYLENOL) 500 MG tablet Take 1,000 mg by mouth every 6 (six) hours as needed for mild pain.    Marland Kitchen amLODipine (NORVASC) 10 MG tablet Take 10 mg by mouth daily.    Marland Kitchen aspirin 81 MG chewable tablet Chew 1 tablet (81 mg total) by mouth daily.    Marland Kitchen atorvastatin (LIPITOR) 20 MG tablet Take 1 tablet (20 mg total) by mouth daily. 30 tablet 0  . benztropine (COGENTIN) 1 MG tablet Take 1 mg by mouth at bedtime.    . haloperidol decanoate (HALDOL DECANOATE) 100 MG/ML injection Inject 100 mg into the muscle every 28 (twenty-eight) days. Inject 2 mLs every 4 weeks    . lisinopril (ZESTRIL) 10 MG tablet Take 10 mg by mouth daily.     No current facility-administered medications on file prior to visit.    PAST MEDICAL HISTORY: Past Medical History:  Diagnosis Date  . Diabetes mellitus without complication (HCC)   . Elevated random blood glucose level   . Foley catheter in place   . Hyperlipidemia   . Hypertension   . Schizophrenia (HCC)     PAST SURGICAL HISTORY: Past Surgical History:  Procedure Laterality Date  . ABDOMINAL AORTOGRAM W/LOWER EXTREMITY N/A 05/17/2020   Procedure: ABDOMINAL AORTOGRAM W/LOWER EXTREMITY;  Surgeon: Yates Decamp, MD;  [facility];  Service: Cardiovascular;  Laterality: N/A;  . AMPUTATION Right 05/19/2020   Procedure: RIGHT BELOW KNEE AMPUTATION;  Surgeon: Nadara Mustard, MD;  Location: [facility] OR;  Service: Orthopedics;  Laterality: Right;  . PERIPHERAL VASCULAR BALLOON ANGIOPLASTY  05/17/2020   Procedure: PERIPHERAL VASCULAR BALLOON ANGIOPLASTY;  Surgeon: Yates DecampGanji, Jay, MD;  Location: [facility];  Service: Cardiovascular;;  Right PT  . urologic procedure after trauma      FAMILY HISTORY: The patient's family history is not on file.   SOCIAL HISTORY:  The patient  reports that he has been smoking cigarettes. He has been smoking about 0.50 packs per day. He has never used  smokeless tobacco. He reports that he does not drink alcohol and does not use drugs.  Review of Systems  Constitutional: Negative for chills and fever.  HENT: Negative for hoarse voice and nosebleeds.   Eyes: Negative for discharge, double vision and pain.  Cardiovascular: Negative for chest pain, claudication, dyspnea on exertion, leg swelling, near-syncope, orthopnea, palpitations, paroxysmal nocturnal dyspnea and syncope.  Respiratory: Negative for hemoptysis and shortness of breath.   Musculoskeletal: Negative for muscle cramps and myalgias.  Gastrointestinal: Negative for abdominal pain, constipation, diarrhea, hematemesis, hematochezia, melena, nausea and vomiting.  Neurological: Negative for dizziness and light-headedness.   PHYSICAL EXAM: Vitals with BMI 06/05/2020 05/31/2020 05/23/2020  Height 6\' 1"  6\' 1"  -  Weight 191 lbs 185 lbs 14 oz -  BMI 25.2 24.53 -  Systolic 105 - 125  Diastolic 49 - 62  Pulse 74 - 72    CONSTITUTIONAL: Appears older than stated age, hemodynamically stable, no acute distress.  In wheelchair. SKIN: Skin is warm and dry. No rash noted. No cyanosis. No pallor. No jaundice HEAD: Normocephalic and atraumatic.  EYES: No scleral icterus MOUTH/THROAT: Moist oral membranes.  NECK: No JVD present. No thyromegaly noted. +carotid bruits  LYMPHATIC: No visible cervical adenopathy.  CHEST Normal respiratory effort. No intercostal retractions  LUNGS: Clear to auscultation bilaterally. No stridor. No wheezes. No rales.  CARDIOVASCULAR: Regular rate and rhythm, positive S1-S2, no murmurs rubs or gallops appreciated. ABDOMINAL: No apparent ascites. Chronic foley catheter.  EXTREMITIES: No peripheral edema. Right trans tibial amputation site is healing well. HEMATOLOGIC: No significant bruising NEUROLOGIC: Oriented to person, place, and time. Nonfocal. Normal muscle tone.  PSYCHIATRIC: Normal mood and affect. Normal behavior. Cooperative  CARDIAC  DATABASE: EKG: 06/05/2020: Sinus  Rhythm, 71 bpm, normal axis, poor R wave progression, consider old anterior infarct.  Echocardiogram: 05/17/2020: LVEF 65-70%, no regional wall motion abnormalities, mild LVH, normal diastolic parameters.  Trivial MR.  Stress Testing:  None  Heart Catheterization: None  Abdominal aortic runoff: May 17, 2020  Prox R Popliteal to Mid R Popliteal lesion is 70% stenosed.  Mid R ATA to Dist R ATA lesion is 100% stenosed.  Prox R TP Trunk to Dist R TP Trunk lesion is 100% stenosed.  Ost R PTA to Prox R PTA lesion is 100% stenosed.  Mid R Peroneal to Dist R Peroneal lesion is 100% stenosed.   Abdominal aortogram and limited bifemoral arteriogram and right femoral arteriogram with distal runoff 05/17/2020:  Abdominal aorta is widely patent, 2 renal arteries 1 on either sides widely patent.  Minimal atherosclerotic changes noted.  Bilateral aortoiliac vessels are widely patent.  Right femoral artery is widely patent.  Right superficial femoral artery has mild disease throughout.  Right distal popliteal artery has a 70 to 75% stenosis with brisk flow.  Right anterior tibial artery occludes in the mid segment and reconstitutes just above the right ankle, extensive collaterals are noted from below. Right TP trunk is occluded.  Peroneal artery is diffusely diseased and is occluded in the proximal segment.  Posterior tibial artery reconstitutes at  the level of the left mid calf.  The collateralized anterior tibial and posterior tibial arteries do form pedal arch with brisk flow.   LABORATORY DATA: CBC Latest Ref Rng & Units 05/23/2020 05/22/2020 05/21/2020  WBC 4.0 - 10.5 K/uL 11.9(H) 11.2(H) 12.2(H)  Hemoglobin 13.0 - 17.0 g/dL 5.8(N) 2.7(P) 7.8(L)  Hematocrit 39 - 52 % 26.3(L) 24.2(L) 24.1(L)  Platelets 150 - 400 K/uL 389 360 348    CMP Latest Ref Rng & Units 05/23/2020 05/22/2020 05/21/2020  Glucose 70 - 99 mg/dL 824(M) 353(I) 144(R)  BUN 8 - 23 mg/dL  11 13 7(L)  Creatinine 0.61 - 1.24 mg/dL 1.54 0.08(Q) 7.61(P)  Sodium 135 - 145 mmol/L 137 136 134(L)  Potassium 3.5 - 5.1 mmol/L 4.5 4.4 4.1  Chloride 98 - 111 mmol/L 103 105 105  CO2 22 - 32 mmol/L 26 24 23   Calcium 8.9 - 10.3 mg/dL 9.0 ) 5.0(D)  Total Protein 6.5 - 8.1 g/dL - - -  Total Bilirubin 0.3 - 1.2 mg/dL - - -  Alkaline Phos 38 - 126 U/L - - -  AST 15 - 41 U/L - - -  ALT 0 - 44 U/L - - -    Lipid Panel     Component Value Date/Time   CHOL 114 05/17/2020 1205   CHOL 182 02/02/2020 1427   TRIG 31 05/17/2020 1205   HDL 37 (L) 05/17/2020 1205   HDL 64 02/02/2020 1427   CHOLHDL 3.1 05/17/2020 1205   VLDL 6 05/17/2020 1205   LDLCALC 71 05/17/2020 1205   LDLCALC 105 (H) 02/02/2020 1427   LABVLDL 13 02/02/2020 1427    Lab Results  Component Value Date   HGBA1C 5.5 02/02/2020   No components found for: NTPROBNP No results found for: TSH  Cardiac Panel (last 3 results) No results for input(s): CKTOTAL, CKMB, TROPONINIHS, RELINDX in the last 72 hours.  IMPRESSION:    ICD-10-CM   1. Peripheral vascular disease (HCC)  I73.9 EKG 12-Lead    PCV ECHOCARDIOGRAM COMPLETE    PCV MYOCARDIAL PERFUSION WITH LEXISCAN  2. S/P distal right trans-tibial amputation  Z89.9   3. Tobacco abuse  Z72.0   4. Benign hypertension  I10 PCV ECHOCARDIOGRAM COMPLETE    PCV MYOCARDIAL PERFUSION WITH LEXISCAN  5. Mixed hyperlipidemia  E78.2      RECOMMENDATIONS: Ricky Hanson is a 69 y.o. male whose past medical history and cardiovascular risk factors include:  hypertension, hyperlipidemia, diabetes mellitus, tobacco use disorder, peripheral artery disease status post right transtibial amputation secondary to gangrene of the right foot with complete occlusion of 3 vessels of the foot.  Peripheral artery disease status post right transtibial amputation:  Denies symptoms of claudication.  Continue aspirin.  Continue statin therapy.  Patient may benefit from low dose of Xarelto  given his PAD.  I have asked him to discuss it further with Dr. 71 in regards to the healing process after undergoing the right transtibial amputation.  If the site is well-healed would recommend initiation of low-dose Xarelto.  Continue to monitor.  Given his underlying PAD would recommend evaluation of CAD.  Echocardiogram will be ordered to evaluate for structural heart disease and left ventricular systolic function.  Nuclear stress test recommended to evaluate for reversible ischemia.  Tobacco abuse: Educated on importance of complete smoking cessation.  Benign essential hypertension  . Office blood pressure is at goal.  . Medication reconciled.  . Currently managed by primary care provider. . Low salt diet recommended. A diet  that is rich in fruits, vegetables, legumes, and low-fat dairy products and low in snacks, sweets, and meats (such as the Dietary Approaches to Stop Hypertension [DASH] diet).   Hyperlipidemia:  . Continue statin therapy.   . Currently managed by primary care provider. . Patient denies myalgia or other side effects.  FINAL MEDICATION LIST END OF ENCOUNTER: No orders of the defined types were placed in this encounter.   There are no discontinued medications.   Current Outpatient Medications:  .  acetaminophen (TYLENOL) 500 MG tablet, Take 1,000 mg by mouth every 6 (six) hours as needed for mild pain., Disp: , Rfl:  .  amLODipine (NORVASC) 10 MG tablet, Take 10 mg by mouth daily., Disp: , Rfl:  .  aspirin 81 MG chewable tablet, Chew 1 tablet (81 mg total) by mouth daily., Disp: , Rfl:  .  atorvastatin (LIPITOR) 20 MG tablet, Take 1 tablet (20 mg total) by mouth daily., Disp: 30 tablet, Rfl: 0 .  benztropine (COGENTIN) 1 MG tablet, Take 1 mg by mouth at bedtime., Disp: , Rfl:  .  haloperidol decanoate (HALDOL DECANOATE) 100 MG/ML injection, Inject 100 mg into the muscle every 28 (twenty-eight) days. Inject 2 mLs every 4 weeks, Disp: , Rfl:  .  lisinopril  (ZESTRIL) 10 MG tablet, Take 10 mg by mouth daily., Disp: , Rfl:   Orders Placed This Encounter  Procedures  . PCV MYOCARDIAL PERFUSION WITH LEXISCAN  . EKG 12-Lead  . PCV ECHOCARDIOGRAM COMPLETE   --Continue cardiac medications as reconciled in final medication list. --Return in about 6 weeks (around 07/17/2020) for review test results.. Or sooner if needed. --Continue follow-up with your primary care physician regarding the management of your other chronic comorbid conditions.  Patient's questions and concerns were addressed to his satisfaction. He voices understanding of the instructions provided during this encounter.   Total time spent: 45 minutes.  This note was created using a voice recognition software as a result there may be grammatical errors inadvertently enclosed that do not reflect the nature of this encounter. Every attempt is made to correct such errors.  Ricky Delio, DO, [facility] preop clearance in the last week  Pager: [phone] Office: [phone]

## 2020-06-05 NOTE — Patient Instructions (Signed)
Please follow up with your PCP regarding your diabetes and cholesterol management.  Please follow up with Dr. Lajoyce Corners for recommendation to restart low-dose Xarelto for PAD after recent surgery.

## 2020-06-07 ENCOUNTER — Ambulatory Visit (INDEPENDENT_AMBULATORY_CARE_PROVIDER_SITE_OTHER): Payer: Medicare Other | Admitting: Physician Assistant

## 2020-06-07 ENCOUNTER — Encounter: Payer: Self-pay | Admitting: Physician Assistant

## 2020-06-07 VITALS — Ht 73.0 in | Wt 191.0 lb

## 2020-06-07 DIAGNOSIS — Z89511 Acquired absence of right leg below knee: Secondary | ICD-10-CM

## 2020-06-07 NOTE — Progress Notes (Signed)
Office Visit Note   Patient: Ricky Hanson           Date of Birth: Mar 04, 1951           MRN: [id number] Visit Date: 06/07/2020              Requested by: Hoy Register, MD [address] [place],  [place] [address] PCP: Hoy Register, MD  Chief Complaint  Patient presents with  . Right Knee - Routine Post Op    05/19/20 RBKA      HPI: This is a pleasant 69 year old gentleman who is residing in a nursing home.  He is 3 weeks status post right below-knee amputation.  He has been wearing a limb protector and a shrinker.  Assessment & Plan: Visit Diagnoses: No diagnosis found.  Plan: Patient was provided with a prescription to contact [employer] and begin molding for a prosthetic.  He will follow-up in 2 weeks.  Should continue to use the shrinker.Patient is a new right transtibial  amputee.  Patient's current comorbidities are not expected to impact the ability to function with the prescribed prosthesis. Patient verbally communicates a strong desire to use a prosthesis. Patient currently requires mobility aids to ambulate without a prosthesis.  Expects not to use mobility aids with a new prosthesis.  Patient is a K2 level ambulator that will use a prosthesis to walk around their home and the community over low level environmental barriers.     Follow-Up Instructions: No follow-ups on file.   Ortho Exam  Patient is alert, oriented, no adenopathy, well-dressed, normal affect, normal respiratory effort. Well-healed surgical incision.  Swelling is overall well controlled.  The incision is healed without any necrosis or drainage no cellulitis no evidence of infection he does come out to full extension on his leg  Imaging: No results found. No images are attached to the encounter.  Labs: Lab Results  Component Value Date   HGBA1C 5.5 02/02/2020   REPTSTATUS 05/17/2020 FINAL 05/16/2020   CULT MULTIPLE SPECIES PRESENT, SUGGEST RECOLLECTION (A) 05/16/2020     Lab  Results  Component Value Date   ALBUMIN 3.0 (L) 05/15/2020   ALBUMIN 4.1 02/02/2020    No results found for: MG No results found for: VD25OH  No results found for: PREALBUMIN CBC EXTENDED Latest Ref Rng & Units 05/23/2020 05/22/2020 05/21/2020  WBC 4.0 - 10.5 K/uL 11.9(H) 11.2(H) 12.2(H)  RBC 4.22 - 5.81 MIL/uL 3.13(L) 2.93(L) 2.88(L)  HGB 13.0 - 17.0 g/dL 6.8(E) 3.2(Z) 7.8(L)  HCT 39 - 52 % 26.3(L) 24.2(L) 24.1(L)  PLT 150 - 400 K/uL 389 360 348  NEUTROABS 1.7 - 7.7 K/uL - - -  LYMPHSABS 0.7 - 4.0 K/uL - - -     Body mass index is 25.2 kg/m.  Orders:  No orders of the defined types were placed in this encounter.  No orders of the defined types were placed in this encounter.    Procedures: No procedures performed  Clinical Data: No additional findings.  ROS:  All other systems negative, except as noted in the HPI. Review of Systems  Objective: Vital Signs: Ht 6\' 1"  (1.854 m)   Wt 191 lb (86.6 kg)   BMI 25.20 kg/m   Specialty Comments:  No specialty comments available.  PMFS History: Patient Active Problem List   Diagnosis Date Noted  . Osteomyelitis (HCC) 05/16/2020  . Sepsis (HCC) 05/16/2020  . Dry gangrene (HCC) 05/16/2020  . Hypertension associated with diabetes (HCC) 05/16/2020  . Dyslipidemia 05/16/2020  .  Peripheral vascular disease (HCC) 05/16/2020  . Normocytic anemia 05/16/2020  . CKD (chronic kidney disease), stage III 05/16/2020  . Acute kidney injury (HCC) 05/16/2020  . Pain due to onychomycosis of toenails of both feet 03/31/2020  . 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 Traumatic amputation of toe (HCC) 03/31/2020  . Chronic indwelling Foley catheter 02/02/2020   Past Medical History:  Diagnosis Date  . Diabetes mellitus without complication (HCC)   . Elevated random blood glucose level   . Foley catheter in place   . Hyperlipidemia   . Hypertension   . Schizophrenia (HCC)     No family history on file.  Past Surgical History:    Procedure Laterality Date  . ABDOMINAL AORTOGRAM W/LOWER EXTREMITY N/A 05/17/2020   Procedure: ABDOMINAL AORTOGRAM W/LOWER EXTREMITY;  Surgeon: Yates Decamp, MD;  [facility];  Service: Cardiovascular;  Laterality: N/A;  . AMPUTATION Right 05/19/2020   Procedure: RIGHT BELOW KNEE AMPUTATION;  Surgeon: Nadara Mustard, MD;  Location: [facility] OR;  Service: Orthopedics;  Laterality: Right;  . PERIPHERAL VASCULAR BALLOON ANGIOPLASTY  05/17/2020   Procedure: PERIPHERAL VASCULAR BALLOON ANGIOPLASTY;  Surgeon: Yates Decamp, MD;  [facility];  Service: Cardiovascular;;  Right PT  . urologic procedure after trauma     Social History   Occupational History  . Not on file  Tobacco Use  . Smoking status: Current Every Day Smoker    Packs/day: 0.50    Types: Cigarettes  . Smokeless tobacco: Never Used  Substance and Sexual Activity  . Alcohol use: Never  . Drug use: Never  . Sexual activity: Not on file

## 2020-06-09 ENCOUNTER — Other Ambulatory Visit: Payer: Medicare Other

## 2020-06-19 ENCOUNTER — Other Ambulatory Visit: Payer: Medicare Other

## 2020-06-21 ENCOUNTER — Other Ambulatory Visit: Payer: Self-pay

## 2020-06-21 ENCOUNTER — Encounter: Payer: Self-pay | Admitting: Physician Assistant

## 2020-06-21 ENCOUNTER — Ambulatory Visit (INDEPENDENT_AMBULATORY_CARE_PROVIDER_SITE_OTHER): Payer: Medicare Other | Admitting: Physician Assistant

## 2020-06-21 VITALS — Ht 73.0 in | Wt 191.0 lb

## 2020-06-21 DIAGNOSIS — S88111A Complete traumatic amputation at level between knee and ankle, right lower leg, initial encounter: Secondary | ICD-10-CM

## 2020-06-21 DIAGNOSIS — Z89511 Acquired absence of right leg below knee: Secondary | ICD-10-CM

## 2020-06-21 NOTE — Progress Notes (Signed)
Office Visit Note   Patient: Ricky Hanson           Date of Birth: [birth year]           MRN: [id number] Visit Date: 06/21/2020              Requested by: Hoy Register, MD [address] [place],  [place] [address] PCP: Hoy Register, MD  Chief Complaint  Patient presents with  . Right Leg - Routine Post Op    05/19/20 right BKA       HPI: Patient is 1 month status post right below-knee amputation.  He is a nursing facility.  He is using [employer].  He is currently wearing a limb protector and a shrinker.  Assessment & Plan: Visit Diagnoses: No diagnosis found.  Plan: We will apply silver cell to the one area to see if we can dry this out continue with shrinker follow-up in 2 weeks  Follow-Up Instructions: No follow-ups on file.   Ortho Exam  Patient is alert, oriented, no adenopathy, well-dressed, normal affect, normal respiratory effort. Amputation stump is completely healed with the exception of a small 2 x 1 area on the very lateral side of the incision.  There is no surrounding cellulitis it does not probe deeply and just has some bloody drainage no foul odor  Imaging: No results found. No images are attached to the encounter.  Labs: Lab Results  Component Value Date   HGBA1C 5.5 02/02/2020   REPTSTATUS 05/17/2020 FINAL 05/16/2020   CULT MULTIPLE SPECIES PRESENT, SUGGEST RECOLLECTION (A) 05/16/2020     Lab Results  Component Value Date   ALBUMIN 3.0 (L) 05/15/2020   ALBUMIN 4.1 02/02/2020    No results found for: MG No results found for: VD25OH  No results found for: PREALBUMIN CBC EXTENDED Latest Ref Rng & Units 05/23/2020 05/22/2020 05/21/2020  WBC 4.0 - 10.5 K/uL 11.9(H) 11.2(H) 12.2(H)  RBC 4.22 - 5.81 MIL/uL 3.13(L) 2.93(L) 2.88(L)  HGB 13.0 - 17.0 g/dL 6.7(Y) 1.9(J) 7.8(L)  HCT 39 - 52 % 26.3(L) 24.2(L) 24.1(L)  PLT 150 - 400 K/uL 389 360 348  NEUTROABS 1.7 - 7.7 K/uL - - -  LYMPHSABS 0.7 - 4.0 K/uL - - -     Body mass index is 25.2  kg/m.  Orders:  No orders of the defined types were placed in this encounter.  No orders of the defined types were placed in this encounter.    Procedures: No procedures performed  Clinical Data: No additional findings.  ROS:  All other systems negative, except as noted in the HPI. Review of Systems  Objective: Vital Signs: Ht 6\' 1"  (1.854 m)   Wt 191 lb (86.6 kg)   BMI 25.20 kg/m   Specialty Comments:  No specialty comments available.  PMFS History: Patient Active Problem List   Diagnosis Date Noted  . Osteomyelitis (HCC) 05/16/2020  . Sepsis (HCC) 05/16/2020  . Dry gangrene (HCC) 05/16/2020  . Hypertension associated with diabetes (HCC) 05/16/2020  . Dyslipidemia 05/16/2020  . Peripheral vascular disease (HCC) 05/16/2020  . Normocytic anemia 05/16/2020  . CKD (chronic kidney disease), stage III 05/16/2020  . Acute kidney injury (HCC) 05/16/2020  . Pain due to onychomycosis of toenails of both feet 03/31/2020  . 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 Traumatic amputation of toe (HCC) 03/31/2020  . Chronic indwelling Foley catheter 02/02/2020   Past Medical History:  Diagnosis Date  . Diabetes mellitus without complication (HCC)   . Elevated random  blood glucose level   . Foley catheter in place   . Hyperlipidemia   . Hypertension   . Schizophrenia (HCC)     No family history on file.  Past Surgical History:  Procedure Laterality Date  . ABDOMINAL AORTOGRAM W/LOWER EXTREMITY N/A 05/17/2020   Procedure: ABDOMINAL AORTOGRAM W/LOWER EXTREMITY;  Surgeon: Yates Decamp, MD;  [facility];  Service: Cardiovascular;  Laterality: N/A;  . AMPUTATION Right 05/19/2020   Procedure: RIGHT BELOW KNEE AMPUTATION;  Surgeon: Nadara Mustard, MD;  Location: [facility] OR;  Service: Orthopedics;  Laterality: Right;  . PERIPHERAL VASCULAR BALLOON ANGIOPLASTY  05/17/2020   Procedure: PERIPHERAL VASCULAR BALLOON ANGIOPLASTY;  Surgeon: Yates Decamp, MD;  [facility];  Service: Cardiovascular;;  Right PT  . urologic procedure after trauma     Social History   Occupational History  . Not on file  Tobacco Use  . Smoking status: Current Every Day Smoker    Packs/day: 0.50    Types: Cigarettes  . Smokeless tobacco: Never Used  Substance and Sexual Activity  . Alcohol use: Never  . Drug use: Never  . Sexual activity: Not on file

## 2020-06-27 ENCOUNTER — Ambulatory Visit (INDEPENDENT_AMBULATORY_CARE_PROVIDER_SITE_OTHER): Payer: Medicare Other | Admitting: Podiatry

## 2020-06-27 ENCOUNTER — Other Ambulatory Visit: Payer: Self-pay

## 2020-06-27 ENCOUNTER — Encounter: Payer: Self-pay | Admitting: Podiatry

## 2020-06-27 DIAGNOSIS — S98131S Complete traumatic amputation of one right lesser toe, sequela: Secondary | ICD-10-CM

## 2020-06-27 DIAGNOSIS — N179 Acute kidney failure, unspecified: Secondary | ICD-10-CM | POA: Diagnosis not present

## 2020-06-27 DIAGNOSIS — B351 Tinea unguium: Secondary | ICD-10-CM | POA: Diagnosis not present

## 2020-06-27 DIAGNOSIS — N1831 Chronic kidney disease, stage 3a: Secondary | ICD-10-CM | POA: Diagnosis not present

## 2020-06-27 DIAGNOSIS — I739 Peripheral vascular disease, unspecified: Secondary | ICD-10-CM | POA: Diagnosis not present

## 2020-06-27 DIAGNOSIS — M79675 Pain in left toe(s): Secondary | ICD-10-CM | POA: Insufficient documentation

## 2020-06-27 NOTE — Progress Notes (Signed)
This patient returns to my office for at risk foot care.  This patient requires this care by a professional since this patient will be at risk due to having diabetes and pvd and chronic kidney disease.  Patient has BKA. Right leg.  This patient is unable to cut nails himself since the patient cannot reach his nails.These nails are painful walking and wearing shoes. Patient presents to the office in a wheelchair with caregiver. This patient presents for at risk foot care today.  General Appearance  Alert, conversant and in no acute stress.  Vascular  Dorsalis pedis and posterior tibial  pulses are weakly  palpable  Left foot..  Capillary return is diminished. Temperature is  Diminished left foot.  Neurologic  Senn-Weinstein monofilament wire test diminished . Muscle power within normal limits bilaterally.  Nails Thick disfigured discolored nails with subungual debris  from hallux to fifth toes left foot.. No evidence of bacterial infection or drainage bilaterally.  Orthopedic  No limitations of motion  feet .  No crepitus or effusions noted.  No bony pathology or digital deformities noted. BKA right leg.  Skin  normotropic skin with no porokeratosis noted bilaterally.  No signs of infections or ulcers noted.     Onychomycosis    Pain in left toes  Consent was obtained for treatment procedures.   Mechanical debridement of nails 1-5  Left  performed with a nail nipper.  Filed with dremel without incident.    Return office visit   3 months                   Told patient to return for periodic foot care and evaluation due to potential at risk complications.   Helane Gunther DPM

## 2020-07-05 ENCOUNTER — Encounter: Payer: Self-pay | Admitting: Physician Assistant

## 2020-07-05 ENCOUNTER — Ambulatory Visit (INDEPENDENT_AMBULATORY_CARE_PROVIDER_SITE_OTHER): Payer: Medicare Other | Admitting: Physician Assistant

## 2020-07-05 VITALS — Ht 73.0 in | Wt 191.0 lb

## 2020-07-05 DIAGNOSIS — Z89511 Acquired absence of right leg below knee: Secondary | ICD-10-CM

## 2020-07-05 DIAGNOSIS — S88111A Complete traumatic amputation at level between knee and ankle, right lower leg, initial encounter: Secondary | ICD-10-CM

## 2020-07-05 NOTE — Progress Notes (Signed)
Office Visit Note   Patient: Ricky Hanson           Date of Birth: 18-Aug-1951           MRN: [id number] Visit Date: 07/05/2020              Requested by: Hoy Register, MD [address] [place],  [place] [address] PCP: Hoy Register, MD  Chief Complaint  Patient presents with  . 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  He is residing in a nursing home.  He has no complaints.  He is minimally conversational.  He denies any pain.  Assessment & Plan: Visit Diagnoses: No diagnosis found.  Plan: Continue with the shrinker and daily changes.  He will follow-up in 2 weeks.  Follow-Up Instructions: No follow-ups on file.   Ortho Exam  Patient is alert, oriented, no adenopathy, well-dressed, normal affect, normal respiratory effort. Focused examination of his amputation stump: Completely healed with the exception of a small 1 x 1 cm area on the lateral end of the incision.  This does not probe deeply it has no foul odor no purulent drainage no surrounding cellulitis it has good healthy bleeding tissue no evidence of an infective process  Imaging: No results found. No images are attached to the encounter.  Labs: Lab Results  Component Value Date   HGBA1C 5.5 02/02/2020   REPTSTATUS 05/17/2020 FINAL 05/16/2020   CULT MULTIPLE SPECIES PRESENT, SUGGEST RECOLLECTION (A) 05/16/2020     Lab Results  Component Value Date   ALBUMIN 3.0 (L) 05/15/2020   ALBUMIN 4.1 02/02/2020    No results found for: MG No results found for: VD25OH  No results found for: PREALBUMIN CBC EXTENDED Latest Ref Rng & Units 05/23/2020 05/22/2020 05/21/2020  WBC 4.0 - 10.5 K/uL 11.9(H) 11.2(H) 12.2(H)  RBC 4.22 - 5.81 MIL/uL 3.13(L) 2.93(L) 2.88(L)  HGB 13.0 - 17.0 g/dL 0.1(K) 5.5(V) 7.8(L)  HCT 39 - 52 % 26.3(L) 24.2(L) 24.1(L)  PLT 150 - 400 K/uL 389 360 348  NEUTROABS 1.7  - 7.7 K/uL - - -  LYMPHSABS 0.7 - 4.0 K/uL - - -     Body mass index is 25.2 kg/m.  Orders:  No orders of the defined types were placed in this encounter.  No orders of the defined types were placed in this encounter.    Procedures: No procedures performed  Clinical Data: No additional findings.  ROS:  All other systems negative, except as noted in the HPI. Review of Systems  Objective: Vital Signs: Ht 6\' 1"  (1.854 m)   Wt 191 lb (86.6 kg)   BMI 25.20 kg/m   Specialty Comments:  No specialty comments available.  PMFS History: Patient Active Problem List   Diagnosis Date Noted  . Pain due to onychomycosis of toenail of left foot 06/27/2020  . Osteomyelitis (HCC) 05/16/2020  . Sepsis (HCC) 05/16/2020  . Dry gangrene (HCC) 05/16/2020  . Hypertension associated with diabetes (HCC) 05/16/2020  . Dyslipidemia 05/16/2020  . Peripheral vascular disease (HCC) 05/16/2020  . Normocytic anemia 05/16/2020  . CKD (chronic kidney disease), stage III 05/16/2020  . Acute kidney injury (HCC) 05/16/2020  . Pain due to onychomycosis of toenails of both feet 03/31/2020  . 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 Traumatic amputation of toe (HCC) 03/31/2020  . Chronic indwelling Foley catheter  02/02/2020   Past Medical History:  Diagnosis Date  . Diabetes mellitus without complication (HCC)   . Elevated random blood glucose level   . Foley catheter in place   . Hyperlipidemia   . Hypertension   . Schizophrenia (HCC)     No family history on file.  Past Surgical History:  Procedure Laterality Date  . ABDOMINAL AORTOGRAM W/LOWER EXTREMITY N/A 05/17/2020   Procedure: ABDOMINAL AORTOGRAM W/LOWER EXTREMITY;  Surgeon: Yates Decamp, MD;  [facility];  Service: Cardiovascular;  Laterality: N/A;  . AMPUTATION Right 05/19/2020   Procedure: RIGHT BELOW KNEE AMPUTATION;  Surgeon: Nadara Mustard, MD;  Location: [facility] OR;  Service: Orthopedics;  Laterality: Right;  . PERIPHERAL  VASCULAR BALLOON ANGIOPLASTY  05/17/2020   Procedure: PERIPHERAL VASCULAR BALLOON ANGIOPLASTY;  Surgeon: Yates Decamp, MD;  Location: [facility];  Service: Cardiovascular;;  Right PT  . urologic procedure after trauma     Social History   Occupational History  . Not on file  Tobacco Use  . Smoking status: Current Every Day Smoker    Packs/day: 0.50    Types: Cigarettes  . Smokeless tobacco: Never Used  Substance and Sexual Activity  . Alcohol use: Never  . Drug use: Never  . Sexual activity: Not on file

## 2020-07-17 ENCOUNTER — Encounter: Payer: Self-pay | Admitting: Cardiology

## 2020-07-17 ENCOUNTER — Ambulatory Visit: Payer: Medicare Other | Admitting: Cardiology

## 2020-07-17 ENCOUNTER — Other Ambulatory Visit: Payer: Self-pay

## 2020-07-17 VITALS — BP 114/69 | HR 56 | Resp 18 | Ht 73.0 in | Wt 191.0 lb

## 2020-07-17 DIAGNOSIS — I10 Essential (primary) hypertension: Secondary | ICD-10-CM

## 2020-07-17 DIAGNOSIS — Z899 Acquired absence of limb, unspecified: Secondary | ICD-10-CM

## 2020-07-17 DIAGNOSIS — I739 Peripheral vascular disease, unspecified: Secondary | ICD-10-CM

## 2020-07-17 DIAGNOSIS — Z720 Tobacco use: Secondary | ICD-10-CM

## 2020-07-17 DIAGNOSIS — E782 Mixed hyperlipidemia: Secondary | ICD-10-CM

## 2020-07-17 NOTE — Progress Notes (Signed)
Philip Aspen Date of Birth: Aug 23, 1951 MRN: [id number] Primary Care Provider:Newlin, Odette Horns, MD Former Cardiology Providers: Dr. Yates Decamp  Primary Cardiologist: Tessa Lerner, DO, [facility] (established care 06/05/2020)  Date: 07/17/20 Last Office Visit: 06/05/2020  Chief Complaint  Patient presents with   PVD   Results    Echocardiogram and Stress test   Follow-up    6 week    HPI  Nishanth Mccaughan is a 69 y.o.  male who presents to the office with a chief complaint of " PAD follow-up and review test results." Patient's past medical history and cardiovascular risk factors include: hypertension, hyperlipidemia, diabetes mellitus, tobacco use disorder, peripheral artery disease status post right transtibial amputation secondary to gangrene of the right foot with complete occlusion of 3 vessels of the foot.  Patient was recently seen at [facility] by my partner Dr. Yates Decamp given his peripheral vascular disease.  He was asked to follow-up as outpatient for further cardiovascular risk stratification.  In June 2021 patient was found to have gangrene of the right foot with complete occlusion of 3 vessels and therefore underwent a right transtibial amputation.  Thereafter he went to rehab and currently resides in [facility].   Patient denies any chest pain or anginal equivalent.  He states that his right lower extremity is healing well.  He follows up with Dr. Lajoyce Corners. Patient states that since last office visit he has not seen Dr. Lajoyce Corners.  Shared decision at the last visit was to discuss initiation of low-dose Xarelto 2.5 mg p.o. twice daily with Dr. Lajoyce Corners to make sure that hemostasis at the surgical site is achieved in the healing process is going well.   Unfortunately, he continues to smoke 8 to 9 cigarettes/day.  His functional capacity has been reduced since surgery and he is predominantly in a wheelchair.  But is able to walk short distances to and from the bathroom during which time he  does not complain of claudication.  Patient was recommended to have a stress test for further cardiovascular stratification at the last office visit but he chooses not to have a stress test at this time.  FUNCTIONAL STATUS: Limited, predominantly in a wheelchair.  ALLERGIES: No Known Allergies   MEDICATION LIST PRIOR TO VISIT: Current Outpatient Medications on File Prior to Visit  Medication Sig Dispense Refill   acetaminophen (TYLENOL) 500 MG tablet Take 1,000 mg by mouth every 6 (six) hours as needed for mild pain.     amLODipine (NORVASC) 10 MG tablet Take 10 mg by mouth daily.     aspirin 81 MG chewable tablet Chew 1 tablet (81 mg total) by mouth daily.     atorvastatin (LIPITOR) 20 MG tablet Take 1 tablet (20 mg total) by mouth daily. 30 tablet 0   benztropine (COGENTIN) 1 MG tablet Take 1 mg by mouth at bedtime.     haloperidol decanoate (HALDOL DECANOATE) 100 MG/ML injection Inject 100 mg into the muscle every 28 (twenty-eight) days. Inject 2 mLs every 4 weeks     lisinopril (ZESTRIL) 10 MG tablet Take 10 mg by mouth daily.     No current facility-administered medications on file prior to visit.    PAST MEDICAL HISTORY: Past Medical History:  Diagnosis Date   Diabetes mellitus without complication (HCC)    Elevated random blood glucose level    Foley catheter in place    Hyperlipidemia    Hypertension    Schizophrenia (HCC)     PAST SURGICAL HISTORY: Past Surgical  History:  Procedure Laterality Date   ABDOMINAL AORTOGRAM W/LOWER EXTREMITY N/A 05/17/2020   Procedure: ABDOMINAL AORTOGRAM W/LOWER EXTREMITY;  Surgeon: Yates Decamp, MD;  [facility]: [facility];  Service: Cardiovascular;  Laterality: N/A;   AMPUTATION Right 05/19/2020   Procedure: RIGHT BELOW KNEE AMPUTATION;  Surgeon: Nadara Mustard, MD;  Location: [facility] OR;  Service: Orthopedics;  Laterality: Right;   PERIPHERAL VASCULAR BALLOON ANGIOPLASTY  05/17/2020   Procedure: PERIPHERAL VASCULAR  BALLOON ANGIOPLASTY;  Surgeon: Yates Decamp, MD;  Location: [facility];  Service: Cardiovascular;;  Right PT   urologic procedure after trauma      FAMILY HISTORY: The patient's family history is not on file.   SOCIAL HISTORY:  The patient  reports that he has been smoking cigarettes. He has been smoking about 0.50 packs per day. He has never used smokeless tobacco. He reports that he does not drink alcohol and does not use drugs.  Review of Systems  Constitutional: Positive for malaise/fatigue. Negative for chills and fever.  HENT: Negative for hoarse voice and nosebleeds.   Eyes: Negative for discharge, double vision and pain.  Cardiovascular: Negative for chest pain, claudication, dyspnea on exertion, leg swelling, near-syncope, orthopnea, palpitations, paroxysmal nocturnal dyspnea and syncope.  Respiratory: Negative for hemoptysis and shortness of breath.   Musculoskeletal: Negative for muscle cramps and myalgias.  Gastrointestinal: Negative for abdominal pain, constipation, diarrhea, hematemesis, hematochezia, melena, nausea and vomiting.  Neurological: Negative for dizziness and light-headedness.   PHYSICAL EXAM: Vitals with BMI 07/17/2020 07/05/2020 06/21/2020  Height 6\' 1"  6\' 1"  6\' 1"   Weight 191 lbs 191 lbs 191 lbs  BMI 25.2 25.2 25.2  Systolic 114 - -  Diastolic 69 - -  Pulse 56 - -    CONSTITUTIONAL: Appears older than stated age, hemodynamically stable, no acute distress.  In wheelchair. SKIN: Skin is warm and dry. No rash noted. No cyanosis. No pallor. No jaundice HEAD: Normocephalic and atraumatic.  EYES: No scleral icterus MOUTH/THROAT: Moist oral membranes.  NECK: No JVD present. No thyromegaly noted. +carotid bruits  LYMPHATIC: No visible cervical adenopathy.  CHEST Normal respiratory effort. No intercostal retractions  LUNGS: Clear to auscultation bilaterally. No stridor. No wheezes. No rales.  CARDIOVASCULAR: Regular rate and rhythm, positive S1-S2, no  murmurs rubs or gallops appreciated. ABDOMINAL: No apparent ascites. Chronic foley catheter.  EXTREMITIES: No peripheral edema. Right trans tibial amputation. HEMATOLOGIC: No significant bruising NEUROLOGIC: Oriented to person, place, and time. Nonfocal. Normal muscle tone.  PSYCHIATRIC: Normal mood and affect. Normal behavior. Cooperative  CARDIAC DATABASE: EKG: 06/05/2020: Sinus  Rhythm, 71 bpm, normal axis, poor R wave progression, consider old anterior infarct.  Echocardiogram: 05/17/2020: LVEF 65-70%, no regional wall motion abnormalities, mild LVH, normal diastolic parameters.  Trivial MR.  Stress Testing:  None  Heart Catheterization: None  Abdominal aortic runoff: May 17, 2020  Prox R Popliteal to Mid R Popliteal lesion is 70% stenosed.  Mid R ATA to Dist R ATA lesion is 100% stenosed.  Prox R TP Trunk to Dist R TP Trunk lesion is 100% stenosed.  Ost R PTA to Prox R PTA lesion is 100% stenosed.  Mid R Peroneal to Dist R Peroneal lesion is 100% stenosed.   Abdominal aortogram and limited bifemoral arteriogram and right femoral arteriogram with distal runoff 05/17/2020:  Abdominal aorta is widely patent, 2 renal arteries 1 on either sides widely patent.  Minimal atherosclerotic changes noted.  Bilateral aortoiliac vessels are widely patent.  Right femoral artery is widely  patent.  Right superficial femoral artery has mild disease throughout.  Right distal popliteal artery has a 70 to 75% stenosis with brisk flow.  Right anterior tibial artery occludes in the mid segment and reconstitutes just above the right ankle, extensive collaterals are noted from below. Right TP trunk is occluded.  Peroneal artery is diffusely diseased and is occluded in the proximal segment.  Posterior tibial artery reconstitutes at the level of the left mid calf.  The collateralized anterior tibial and posterior tibial arteries do form pedal arch with brisk flow.   LABORATORY DATA: CBC  Latest Ref Rng & Units 05/23/2020 05/22/2020 05/21/2020  WBC 4.0 - 10.5 K/uL 11.9(H) 11.2(H) 12.2(H)  Hemoglobin 13.0 - 17.0 g/dL 0.1(B) 5.1(W) 7.8(L)  Hematocrit 39 - 52 % 26.3(L) 24.2(L) 24.1(L)  Platelets 150 - 400 K/uL 389 360 348    CMP Latest Ref Rng & Units 05/23/2020 05/22/2020 05/21/2020  Glucose 70 - 99 mg/dL 258(N) 277(O) 242(P)  BUN 8 - 23 mg/dL 11 13 7(L)  Creatinine 0.61 - 1.24 mg/dL 5.36 1.44(R) 1.54(M)  Sodium 135 - 145 mmol/L 137 136 134(L)  Potassium 3.5 - 5.1 mmol/L 4.5 4.4 4.1  Chloride 98 - 111 mmol/L 103 105 105  CO2 22 - 32 mmol/L 26 24 23   Calcium 8.9 - 10.3 mg/dL 9.0 ) 0.8(Q)  Total Protein 6.5 - 8.1 g/dL - - -  Total Bilirubin 0.3 - 1.2 mg/dL - - -  Alkaline Phos 38 - 126 U/L - - -  AST 15 - 41 U/L - - -  ALT 0 - 44 U/L - - -    Lipid Panel     Component Value Date/Time   CHOL 114 05/17/2020 1205   CHOL 182 02/02/2020 1427   TRIG 31 05/17/2020 1205   HDL 37 (L) 05/17/2020 1205   HDL 64 02/02/2020 1427   CHOLHDL 3.1 05/17/2020 1205   VLDL 6 05/17/2020 1205   LDLCALC 71 05/17/2020 1205   LDLCALC 105 (H) 02/02/2020 1427   LABVLDL 13 02/02/2020 1427    Lab Results  Component Value Date   HGBA1C 5.5 02/02/2020   No components found for: NTPROBNP No results found for: TSH  Cardiac Panel (last 3 results) No results for input(s): CKTOTAL, CKMB, TROPONINIHS, RELINDX in the last 72 hours.  IMPRESSION:    ICD-10-CM   1. Peripheral vascular disease (HCC)  I73.9   2. S/P distal right trans-tibial amputation  Z89.9   3. Tobacco abuse  Z72.0   4. Benign hypertension  I10   5. Mixed hyperlipidemia  E78.2      RECOMMENDATIONS: Leory Allinson is a 69 y.o. male whose past medical history and cardiovascular risk factors include:  hypertension, hyperlipidemia, diabetes mellitus, tobacco use disorder, peripheral artery disease status post right transtibial amputation secondary to gangrene of the right foot with complete occlusion of 3 vessels of the  foot.  Peripheral artery disease status post right transtibial amputation:  Cannot comment on symptoms of claudication as he is in wheelchair majority of the time.  Continue aspirin  Continue statin therapy.  Educated on importance of complete smoking cessation.  Patient may benefit from low dose of Xarelto given his PAD.  I have asked him to discuss it further with Dr. 71 in regards to the healing process after undergoing the right transtibial amputation.  If the site is well-healed would recommend initiation of low-dose Xarelto. Patient states he will follow up with Dr. Lajoyce Corners and call Lajoyce Corners back.   Continue  to monitor.  Given his underlying PAD would recommend evaluation of CAD.  Stress test was ordered at the last office visit but he choose not to get it done for now. He had an echo in June that was reviewed with him.   Tobacco abuse: Has not stopped despite educating him regarding the harmful effects of smoking during prior visits. Educated on importance of complete smoking cessation.  Benign essential hypertension   Office blood pressure is at goal.   Medication reconciled.   Currently managed by primary care provider.  Low salt diet recommended. A diet that is rich in fruits, vegetables, legumes, and low-fat dairy products and low in snacks, sweets, and meats (such as the Dietary Approaches to Stop Hypertension [DASH] diet).   Hyperlipidemia:   Continue statin therapy.    Currently managed by primary care provider.  Patient denies myalgia or other side effects.  FINAL MEDICATION LIST END OF ENCOUNTER: No orders of the defined types were placed in this encounter.   There are no discontinued medications.   Current Outpatient Medications:    acetaminophen (TYLENOL) 500 MG tablet, Take 1,000 mg by mouth every 6 (six) hours as needed for mild pain., Disp: , Rfl:    amLODipine (NORVASC) 10 MG tablet, Take 10 mg by mouth daily., Disp: , Rfl:    aspirin 81 MG chewable  tablet, Chew 1 tablet (81 mg total) by mouth daily., Disp: , Rfl:    atorvastatin (LIPITOR) 20 MG tablet, Take 1 tablet (20 mg total) by mouth daily., Disp: 30 tablet, Rfl: 0   benztropine (COGENTIN) 1 MG tablet, Take 1 mg by mouth at bedtime., Disp: , Rfl:    haloperidol decanoate (HALDOL DECANOATE) 100 MG/ML injection, Inject 100 mg into the muscle every 28 (twenty-eight) days. Inject 2 mLs every 4 weeks, Disp: , Rfl:    lisinopril (ZESTRIL) 10 MG tablet, Take 10 mg by mouth daily., Disp: , Rfl:   No orders of the defined types were placed in this encounter.  --Continue cardiac medications as reconciled in final medication list. --Return in about 6 months (around 01/23/2021) for PAD followup. Or sooner if needed. --Continue follow-up with your primary care physician regarding the management of your other chronic comorbid conditions.  Patient's questions and concerns were addressed to his satisfaction. He voices understanding of the instructions provided during this encounter.   Total time spent: 20 minutes.  This note was created using a voice recognition software as a result there may be grammatical errors inadvertently enclosed that do not reflect the nature of this encounter. Every attempt is made to correct such errors.  Tessa LernerSunit Vannak Montenegro, [place]DO, [facility]   Pager: [phone] Office: [phone]

## 2020-07-19 ENCOUNTER — Encounter: Payer: Self-pay | Admitting: Physician Assistant

## 2020-07-19 ENCOUNTER — Ambulatory Visit (INDEPENDENT_AMBULATORY_CARE_PROVIDER_SITE_OTHER): Payer: Medicare Other | Admitting: Physician Assistant

## 2020-07-19 DIAGNOSIS — Z89511 Acquired absence of right leg below knee: Secondary | ICD-10-CM

## 2020-07-19 DIAGNOSIS — S88111A Complete traumatic amputation at level between knee and ankle, right lower leg, initial encounter: Secondary | ICD-10-CM

## 2020-07-19 NOTE — Progress Notes (Signed)
Office Visit Note   Patient: Ricky Hanson           Date of Birth: [birth year]           MRN: [id number] Visit Date: 07/19/2020              Requested by: Hoy Register, MD [address] [place],  [place] [address] PCP: Hoy Register, MD  No chief complaint on file.     HPI: Patient presents today for follow-up on his right below-knee amputation.  He is currently residing at a nursing facility.  He is more alert and awake today than at previous visits.  Assessment & Plan: Visit Diagnoses: No diagnosis found.  Plan: I have given a patient the prescription for a K2 socket.  I also would like the nursing home physical therapy to evaluate him for his potential to be able to use a prosthetic.  Follow-up with [place] in 4 weeks  Follow-Up Instructions: No follow-ups on file.   Ortho Exam  Patient is alert, oriented, no adenopathy, well-dressed, normal affect, normal respiratory effort. Below-knee amputation has completely healed there are 2 small skin breakdowns that are superficial on the lateral side.  There at skin level they have no cellulitis or foul odor involved no necrotic areas swelling is well controlled Patient is a new right transtibial  amputee.  Patient's current comorbidities are not expected to impact the ability to function with the prescribed prosthesis. Patient verbally communicates a strong desire to use a prosthesis. Patient currently requires mobility aids to ambulate without a prosthesis.  Expects not to use mobility aids with a new prosthesis.  Patient is a K2 level ambulator that will use a prosthesis to walk around their home and the community over low level environmental barriers.    Imaging: No results found. No images are attached to the encounter.  Labs: Lab Results  Component Value Date   HGBA1C 5.5 02/02/2020   REPTSTATUS 05/17/2020 FINAL 05/16/2020   CULT MULTIPLE SPECIES PRESENT, SUGGEST RECOLLECTION (A) 05/16/2020     Lab Results    Component Value Date   ALBUMIN 3.0 (L) 05/15/2020   ALBUMIN 4.1 02/02/2020    No results found for: MG No results found for: VD25OH  No results found for: PREALBUMIN CBC EXTENDED Latest Ref Rng & Units 05/23/2020 05/22/2020 05/21/2020  WBC 4.0 - 10.5 K/uL 11.9(H) 11.2(H) 12.2(H)  RBC 4.22 - 5.81 MIL/uL 3.13(L) 2.93(L) 2.88(L)  HGB 13.0 - 17.0 g/dL 9.5(A) 2.1(H) 7.8(L)  HCT 39 - 52 % 26.3(L) 24.2(L) 24.1(L)  PLT 150 - 400 K/uL 389 360 348  NEUTROABS 1.7 - 7.7 K/uL - - -  LYMPHSABS 0.7 - 4.0 K/uL - - -     There is no height or weight on file to calculate BMI.  Orders:  No orders of the defined types were placed in this encounter.  No orders of the defined types were placed in this encounter.    Procedures: No procedures performed  Clinical Data: No additional findings.  ROS:  All other systems negative, except as noted in the HPI. Review of Systems  Objective: Vital Signs: There were no vitals taken for this visit.  Specialty Comments:  No specialty comments available.  PMFS History: Patient Active Problem List   Diagnosis Date Noted  . Pain due to onychomycosis of toenail of left foot 06/27/2020  . Osteomyelitis (HCC) 05/16/2020  . Sepsis (HCC) 05/16/2020  . Dry gangrene (HCC) 05/16/2020  . Hypertension associated with diabetes (HCC)  05/16/2020  . Dyslipidemia 05/16/2020  . Peripheral vascular disease (HCC) 05/16/2020  . Normocytic anemia 05/16/2020  . CKD (chronic kidney disease), stage III 05/16/2020  . Acute kidney injury (HCC) 05/16/2020  . Pain due to onychomycosis of toenails of both feet 03/31/2020  . 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 Traumatic amputation of toe (HCC) 03/31/2020  . Chronic indwelling Foley catheter 02/02/2020   Past Medical History:  Diagnosis Date  . Diabetes mellitus without complication (HCC)   . Elevated random blood glucose level   . Foley catheter in place   . Hyperlipidemia   . Hypertension   . Schizophrenia (HCC)      No family history on file.  Past Surgical History:  Procedure Laterality Date  . ABDOMINAL AORTOGRAM W/LOWER EXTREMITY N/A 05/17/2020   Procedure: ABDOMINAL AORTOGRAM W/LOWER EXTREMITY;  Surgeon: Yates Decamp, MD;  [facility];  Service: Cardiovascular;  Laterality: N/A;  . AMPUTATION Right 05/19/2020   Procedure: RIGHT BELOW KNEE AMPUTATION;  Surgeon: Nadara Mustard, MD;  [facility];  Service: Orthopedics;  Laterality: Right;  . PERIPHERAL VASCULAR BALLOON ANGIOPLASTY  05/17/2020   Procedure: PERIPHERAL VASCULAR BALLOON ANGIOPLASTY;  Surgeon: Yates Decamp, MD;  Location: [facility];  Service: Cardiovascular;;  Right PT  . urologic procedure after trauma     Social History   Occupational History  . Not on file  Tobacco Use  . Smoking status: Current Every Day Smoker    Packs/day: 0.50    Types: Cigarettes  . Smokeless tobacco: Never Used  Vaping Use  . Vaping Use: Never used  Substance and Sexual Activity  . Alcohol use: Never  . Drug use: Never  . Sexual activity: Not on file

## 2020-08-18 ENCOUNTER — Ambulatory Visit (INDEPENDENT_AMBULATORY_CARE_PROVIDER_SITE_OTHER): Payer: Medicare Other | Admitting: Physician Assistant

## 2020-08-18 ENCOUNTER — Encounter: Payer: Self-pay | Admitting: Physician Assistant

## 2020-08-18 VITALS — Ht 73.0 in | Wt 191.0 lb

## 2020-08-18 DIAGNOSIS — Z89511 Acquired absence of right leg below knee: Secondary | ICD-10-CM

## 2020-08-18 NOTE — Progress Notes (Signed)
Office Visit Note   Patient: Ricky Hanson           Date of Birth: [birth year]           MRN: [id number] Visit Date: 08/18/2020              Requested by: Hoy Register, MD [address] [place],  [place] [address] PCP: Hoy Register, MD  Chief Complaint  Patient presents with  . 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  He is doing well.  He states he has not been fitted for his socket yet.  He does tell me that he was ambulatory prior to his below-knee amputation  Assessment & Plan: Visit Diagnoses: No diagnosis found.  Plan: Had previously given the patient a prescription unsure what happened to this.  Emphasized the importance of him wearing his shrinker all the time.  Also provided a prescription and recommendation that they make an appointment for him at [facility] so he can be fitted for prosthetic  Follow-Up Instructions: No follow-ups on file.   Ortho Exam  Patient is alert, oriented, no adenopathy, well-dressed, normal affect, normal respiratory effort. Right amputation stump is completely healed he does have some mild swelling but he is not wearing his shrinker today.  He can come to almost full extension with his knees well approximated wound edges completely healed no drainage no necrosis no open areas no cellulitis  Imaging: No results found. No images are attached to the encounter.  Labs: Lab Results  Component Value Date   HGBA1C 5.5 02/02/2020   REPTSTATUS 05/17/2020 FINAL 05/16/2020   CULT MULTIPLE SPECIES PRESENT, SUGGEST RECOLLECTION (A) 05/16/2020     Lab Results  Component Value Date   ALBUMIN 3.0 (L) 05/15/2020   ALBUMIN 4.1 02/02/2020    No results found for: MG No results found for: VD25OH  No results found for: PREALBUMIN CBC EXTENDED Latest Ref Rng & Units 05/23/2020 05/22/2020 05/21/2020  WBC 4.0 - 10.5 K/uL 11.9(H) 11.2(H) 12.2(H)  RBC  4.22 - 5.81 MIL/uL 3.13(L) 2.93(L) 2.88(L)  HGB 13.0 - 17.0 g/dL 0.5(L) 9.7(Q) 7.8(L)  HCT 39 - 52 % 26.3(L) 24.2(L) 24.1(L)  PLT 150 - 400 K/uL 389 360 348  NEUTROABS 1.7 - 7.7 K/uL - - -  LYMPHSABS 0.7 - 4.0 K/uL - - -     Body mass index is 25.2 kg/m.  Orders:  No orders of the defined types were placed in this encounter.  No orders of the defined types were placed in this encounter.    Procedures: No procedures performed  Clinical Data: No additional findings.  ROS:  All other systems negative, except as noted in the HPI. Review of Systems  Objective: Vital Signs: Ht 6\' 1"  (1.854 m)   Wt 191 lb (86.6 kg)   BMI 25.20 kg/m   Specialty Comments:  No specialty comments available.  PMFS History: Patient Active Problem List   Diagnosis Date Noted  . Pain due to onychomycosis of toenail of left foot 06/27/2020  . Osteomyelitis (HCC) 05/16/2020  . Sepsis (HCC) 05/16/2020  . Dry gangrene (HCC) 05/16/2020  . Hypertension associated with diabetes (HCC) 05/16/2020  . Dyslipidemia 05/16/2020  . Peripheral vascular disease (HCC) 05/16/2020  . Normocytic anemia 05/16/2020  . CKD (chronic kidney disease), stage III 05/16/2020  . Acute kidney injury (HCC) 05/16/2020  . Pain due to onychomycosis of  toenails of both feet 03/31/2020  . 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 Traumatic amputation of toe (HCC) 03/31/2020  . Chronic indwelling Foley catheter 02/02/2020   Past Medical History:  Diagnosis Date  . Diabetes mellitus without complication (HCC)   . Elevated random blood glucose level   . Foley catheter in place   . Hyperlipidemia   . Hypertension   . Schizophrenia (HCC)     History reviewed. No pertinent family history.  Past Surgical History:  Procedure Laterality Date  . ABDOMINAL AORTOGRAM W/LOWER EXTREMITY N/A 05/17/2020   Procedure: ABDOMINAL AORTOGRAM W/LOWER EXTREMITY;  Surgeon: Yates Decamp, MD;  Location: [facility];  Service: Cardiovascular;   Laterality: N/A;  . AMPUTATION Right 05/19/2020   Procedure: RIGHT BELOW KNEE AMPUTATION;  Surgeon: Nadara Mustard, MD;  Location: [facility];  Service: Orthopedics;  Laterality: Right;  . PERIPHERAL VASCULAR BALLOON ANGIOPLASTY  05/17/2020   Procedure: PERIPHERAL VASCULAR BALLOON ANGIOPLASTY;  Surgeon: Yates Decamp, MD;  Location: [facility];  Service: Cardiovascular;;  Right PT  . urologic procedure after trauma     Social History   Occupational History  . Not on file  Tobacco Use  . Smoking status: Current Every Day Smoker    Packs/day: 0.50    Types: Cigarettes  . Smokeless tobacco: Never Used  Vaping Use  . Vaping Use: Never used  Substance and Sexual Activity  . Alcohol use: Never  . Drug use: Never  . Sexual activity: Not on file

## 2020-09-15 ENCOUNTER — Ambulatory Visit: Payer: Medicare Other | Admitting: Physician Assistant

## 2020-10-03 ENCOUNTER — Ambulatory Visit: Payer: Medicare Other | Admitting: Podiatry

## 2020-10-27 ENCOUNTER — Ambulatory Visit: Payer: Medicare Other | Admitting: Podiatry

## 2020-11-01 ENCOUNTER — Other Ambulatory Visit: Payer: Self-pay

## 2020-11-01 ENCOUNTER — Ambulatory Visit (INDEPENDENT_AMBULATORY_CARE_PROVIDER_SITE_OTHER): Payer: Medicare Other | Admitting: Podiatry

## 2020-11-01 DIAGNOSIS — I739 Peripheral vascular disease, unspecified: Secondary | ICD-10-CM | POA: Diagnosis not present

## 2020-11-01 DIAGNOSIS — M79675 Pain in left toe(s): Secondary | ICD-10-CM | POA: Diagnosis not present

## 2020-11-01 DIAGNOSIS — L853 Xerosis cutis: Secondary | ICD-10-CM

## 2020-11-01 DIAGNOSIS — B351 Tinea unguium: Secondary | ICD-10-CM

## 2020-11-03 ENCOUNTER — Encounter: Payer: Self-pay | Admitting: Podiatry

## 2020-11-03 NOTE — Progress Notes (Signed)
  Subjective:  Patient ID: Ricky Hanson, male    DOB: 01-03-1951,  MRN: [id number]  Chief Complaint  Patient presents with  . Routine foot care    3 month nail trim    69 y.o. male returns for the above complaint.  Patient presents with thickened elongated dystrophic toenails x5 to the left side.  Patient has a BKA on the right side.  He states that is painful to walk on it.  He would like to have them debrided down as is not able to do himself.  He is a diabetic with last A1c of 5.5.  He also has secondary complaint of xerosis to right lower extremity.  He would like to know if there is something that can be done for this.  Objective:  There were no vitals filed for this visit. Podiatric Exam: Vascular: dorsalis pedis and posterior tibial pulses are non-palpable bilateral. Capillary return is immediate. Temperature gradient is WNL. Skin turgor WNL  Sensorium: Normal Semmes Weinstein monofilament test. Normal tactile sensation bilaterally. Nail Exam: Pt has thick disfigured discolored nails with subungual debris noted left entire nail hallux through fifth toenails.  Pain on palpation to the nails.  Right BKA noted Ulcer Exam: There is no evidence of ulcer or pre-ulcerative changes or infection. Orthopedic Exam: Muscle tone and strength are WNL. No limitations in general ROM. No crepitus or effusions noted. HAV  B/L.  Hammer toes 2-5  B/L. Skin: No Porokeratosis. No infection or ulcers.  Xerosis noted to right lower extremity.  No itching noted.  No ulceration noted.    Assessment & Plan:   1. Xerosis cutis   2. PAD (peripheral artery disease) (HCC)   3. Pain due to onychomycosis of toenail of left foot     Patient was evaluated and treated and all questions answered.  Onychomycosis with pain  -Nails palliatively debrided as below. -Educated on self-care  Procedure: Nail Debridement Rationale: pain  Type of Debridement: manual, sharp debridement. Instrumentation: Nail nipper,  rotary burr. Number of Nails: 5  Procedures and Treatment: Consent by patient was obtained for treatment procedures. The patient understood the discussion of treatment and procedures well. All questions were answered thoroughly reviewed. Debridement of mycotic and hypertrophic toenails, 1 through 5 bilateral and clearing of subungual debris. No ulceration, no infection noted.  Return Visit-Office Procedure: Patient instructed to return to the office for a follow up visit 3 months for continued evaluation and treatment.  Nicholes Rough, DPM    No follow-ups on file.

## 2020-11-08 ENCOUNTER — Inpatient Hospital Stay (HOSPITAL_COMMUNITY)
Admission: EM | Admit: 2020-11-08 | Discharge: 2020-12-04 | DRG: 698 | Disposition: A | Discharge status: Skilled Nursing Facility | Payer: Medicare Other | Source: Skilled Nursing Facility | Attending: Internal Medicine | Admitting: Internal Medicine

## 2020-11-08 ENCOUNTER — Encounter (HOSPITAL_COMMUNITY): Payer: Self-pay

## 2020-11-08 ENCOUNTER — Observation Stay (HOSPITAL_COMMUNITY): Payer: Medicare Other

## 2020-11-08 ENCOUNTER — Emergency Department (HOSPITAL_COMMUNITY): Payer: Medicare Other

## 2020-11-08 ENCOUNTER — Other Ambulatory Visit: Payer: Self-pay

## 2020-11-08 DIAGNOSIS — R066 Hiccough: Secondary | ICD-10-CM | POA: Diagnosis not present

## 2020-11-08 DIAGNOSIS — Z89511 Acquired absence of right leg below knee: Secondary | ICD-10-CM

## 2020-11-08 DIAGNOSIS — R338 Other retention of urine: Secondary | ICD-10-CM | POA: Diagnosis present

## 2020-11-08 DIAGNOSIS — I10 Essential (primary) hypertension: Secondary | ICD-10-CM | POA: Diagnosis not present

## 2020-11-08 DIAGNOSIS — R1310 Dysphagia, unspecified: Secondary | ICD-10-CM | POA: Diagnosis present

## 2020-11-08 DIAGNOSIS — J9601 Acute respiratory failure with hypoxia: Secondary | ICD-10-CM | POA: Diagnosis not present

## 2020-11-08 DIAGNOSIS — I462 Cardiac arrest due to underlying cardiac condition: Secondary | ICD-10-CM | POA: Diagnosis not present

## 2020-11-08 DIAGNOSIS — E43 Unspecified severe protein-calorie malnutrition: Secondary | ICD-10-CM | POA: Diagnosis not present

## 2020-11-08 DIAGNOSIS — G929 Unspecified toxic encephalopathy: Secondary | ICD-10-CM | POA: Diagnosis not present

## 2020-11-08 DIAGNOSIS — N183 Chronic kidney disease, stage 3 unspecified: Secondary | ICD-10-CM | POA: Diagnosis present

## 2020-11-08 DIAGNOSIS — I5033 Acute on chronic diastolic (congestive) heart failure: Secondary | ICD-10-CM | POA: Diagnosis present

## 2020-11-08 DIAGNOSIS — J9690 Respiratory failure, unspecified, unspecified whether with hypoxia or hypercapnia: Secondary | ICD-10-CM

## 2020-11-08 DIAGNOSIS — Z20822 Contact with and (suspected) exposure to covid-19: Secondary | ICD-10-CM | POA: Diagnosis present

## 2020-11-08 DIAGNOSIS — E1151 Type 2 diabetes mellitus with diabetic peripheral angiopathy without gangrene: Secondary | ICD-10-CM | POA: Diagnosis present

## 2020-11-08 DIAGNOSIS — K56600 Partial intestinal obstruction, unspecified as to cause: Secondary | ICD-10-CM | POA: Diagnosis not present

## 2020-11-08 DIAGNOSIS — T83511A Infection and inflammatory reaction due to indwelling urethral catheter, initial encounter: Secondary | ICD-10-CM | POA: Diagnosis not present

## 2020-11-08 DIAGNOSIS — F419 Anxiety disorder, unspecified: Secondary | ICD-10-CM | POA: Diagnosis not present

## 2020-11-08 DIAGNOSIS — J690 Pneumonitis due to inhalation of food and vomit: Secondary | ICD-10-CM | POA: Diagnosis not present

## 2020-11-08 DIAGNOSIS — R451 Restlessness and agitation: Secondary | ICD-10-CM | POA: Diagnosis not present

## 2020-11-08 DIAGNOSIS — D62 Acute posthemorrhagic anemia: Secondary | ICD-10-CM | POA: Diagnosis not present

## 2020-11-08 DIAGNOSIS — F32A Depression, unspecified: Secondary | ICD-10-CM | POA: Diagnosis present

## 2020-11-08 DIAGNOSIS — I739 Peripheral vascular disease, unspecified: Secondary | ICD-10-CM | POA: Diagnosis present

## 2020-11-08 DIAGNOSIS — I472 Ventricular tachycardia: Secondary | ICD-10-CM | POA: Diagnosis not present

## 2020-11-08 DIAGNOSIS — F0150 Vascular dementia without behavioral disturbance: Secondary | ICD-10-CM | POA: Diagnosis present

## 2020-11-08 DIAGNOSIS — R1011 Right upper quadrant pain: Secondary | ICD-10-CM

## 2020-11-08 DIAGNOSIS — T83091A Other mechanical complication of indwelling urethral catheter, initial encounter: Secondary | ICD-10-CM | POA: Diagnosis present

## 2020-11-08 DIAGNOSIS — G9341 Metabolic encephalopathy: Secondary | ICD-10-CM | POA: Diagnosis present

## 2020-11-08 DIAGNOSIS — Z79899 Other long term (current) drug therapy: Secondary | ICD-10-CM

## 2020-11-08 DIAGNOSIS — G931 Anoxic brain damage, not elsewhere classified: Secondary | ICD-10-CM | POA: Diagnosis not present

## 2020-11-08 DIAGNOSIS — K56609 Unspecified intestinal obstruction, unspecified as to partial versus complete obstruction: Secondary | ICD-10-CM

## 2020-11-08 DIAGNOSIS — E860 Dehydration: Secondary | ICD-10-CM | POA: Diagnosis present

## 2020-11-08 DIAGNOSIS — I6381 Other cerebral infarction due to occlusion or stenosis of small artery: Secondary | ICD-10-CM | POA: Diagnosis not present

## 2020-11-08 DIAGNOSIS — F17210 Nicotine dependence, cigarettes, uncomplicated: Secondary | ICD-10-CM | POA: Diagnosis present

## 2020-11-08 DIAGNOSIS — K4030 Unilateral inguinal hernia, with obstruction, without gangrene, not specified as recurrent: Secondary | ICD-10-CM | POA: Diagnosis not present

## 2020-11-08 DIAGNOSIS — I469 Cardiac arrest, cause unspecified: Secondary | ICD-10-CM | POA: Diagnosis present

## 2020-11-08 DIAGNOSIS — D509 Iron deficiency anemia, unspecified: Secondary | ICD-10-CM | POA: Diagnosis present

## 2020-11-08 DIAGNOSIS — J939 Pneumothorax, unspecified: Secondary | ICD-10-CM | POA: Diagnosis not present

## 2020-11-08 DIAGNOSIS — Z7982 Long term (current) use of aspirin: Secondary | ICD-10-CM

## 2020-11-08 DIAGNOSIS — N1831 Chronic kidney disease, stage 3a: Secondary | ICD-10-CM | POA: Diagnosis present

## 2020-11-08 DIAGNOSIS — K922 Gastrointestinal hemorrhage, unspecified: Secondary | ICD-10-CM | POA: Diagnosis not present

## 2020-11-08 DIAGNOSIS — Z978 Presence of other specified devices: Secondary | ICD-10-CM

## 2020-11-08 DIAGNOSIS — Z4659 Encounter for fitting and adjustment of other gastrointestinal appliance and device: Secondary | ICD-10-CM

## 2020-11-08 DIAGNOSIS — J948 Other specified pleural conditions: Secondary | ICD-10-CM

## 2020-11-08 DIAGNOSIS — D6489 Other specified anemias: Secondary | ICD-10-CM | POA: Diagnosis present

## 2020-11-08 DIAGNOSIS — Z452 Encounter for adjustment and management of vascular access device: Secondary | ICD-10-CM

## 2020-11-08 DIAGNOSIS — I130 Hypertensive heart and chronic kidney disease with heart failure and stage 1 through stage 4 chronic kidney disease, or unspecified chronic kidney disease: Secondary | ICD-10-CM | POA: Diagnosis present

## 2020-11-08 DIAGNOSIS — N390 Urinary tract infection, site not specified: Secondary | ICD-10-CM

## 2020-11-08 DIAGNOSIS — G319 Degenerative disease of nervous system, unspecified: Secondary | ICD-10-CM | POA: Diagnosis present

## 2020-11-08 DIAGNOSIS — F209 Schizophrenia, unspecified: Secondary | ICD-10-CM | POA: Diagnosis present

## 2020-11-08 DIAGNOSIS — E1165 Type 2 diabetes mellitus with hyperglycemia: Secondary | ICD-10-CM | POA: Diagnosis not present

## 2020-11-08 DIAGNOSIS — I4901 Ventricular fibrillation: Secondary | ICD-10-CM | POA: Diagnosis not present

## 2020-11-08 DIAGNOSIS — Z66 Do not resuscitate: Secondary | ICD-10-CM | POA: Diagnosis present

## 2020-11-08 DIAGNOSIS — J9383 Other pneumothorax: Secondary | ICD-10-CM | POA: Diagnosis not present

## 2020-11-08 DIAGNOSIS — M24531 Contracture, right wrist: Secondary | ICD-10-CM | POA: Diagnosis present

## 2020-11-08 DIAGNOSIS — E785 Hyperlipidemia, unspecified: Secondary | ICD-10-CM | POA: Diagnosis present

## 2020-11-08 DIAGNOSIS — F05 Delirium due to known physiological condition: Secondary | ICD-10-CM | POA: Diagnosis not present

## 2020-11-08 DIAGNOSIS — N136 Pyonephrosis: Secondary | ICD-10-CM | POA: Diagnosis present

## 2020-11-08 DIAGNOSIS — Z515 Encounter for palliative care: Secondary | ICD-10-CM

## 2020-11-08 DIAGNOSIS — T85528A Displacement of other gastrointestinal prosthetic devices, implants and grafts, initial encounter: Secondary | ICD-10-CM

## 2020-11-08 DIAGNOSIS — J9811 Atelectasis: Secondary | ICD-10-CM | POA: Diagnosis not present

## 2020-11-08 DIAGNOSIS — E875 Hyperkalemia: Secondary | ICD-10-CM | POA: Diagnosis present

## 2020-11-08 DIAGNOSIS — Z6824 Body mass index (BMI) 24.0-24.9, adult: Secondary | ICD-10-CM

## 2020-11-08 DIAGNOSIS — N179 Acute kidney failure, unspecified: Secondary | ICD-10-CM | POA: Diagnosis present

## 2020-11-08 DIAGNOSIS — R579 Shock, unspecified: Secondary | ICD-10-CM | POA: Diagnosis not present

## 2020-11-08 DIAGNOSIS — I4510 Unspecified right bundle-branch block: Secondary | ICD-10-CM | POA: Diagnosis present

## 2020-11-08 LAB — CBC
HCT: 25.6 % — ABNORMAL LOW (ref 39.0–52.0)
Hemoglobin: 8.3 g/dL — ABNORMAL LOW (ref 13.0–17.0)
MCH: 25.6 pg — ABNORMAL LOW (ref 26.0–34.0)
MCHC: 32.4 g/dL (ref 30.0–36.0)
MCV: 79 fL — ABNORMAL LOW (ref 80.0–100.0)
Platelets: 499 10*3/uL — ABNORMAL HIGH (ref 150–400)
RBC: 3.24 MIL/uL — ABNORMAL LOW (ref 4.22–5.81)
RDW: 15 % (ref 11.5–15.5)
WBC: 18.3 10*3/uL — ABNORMAL HIGH (ref 4.0–10.5)
nRBC: 0 % (ref 0.0–0.2)

## 2020-11-08 LAB — CBC WITH DIFFERENTIAL/PLATELET
Abs Immature Granulocytes: 0.37 10*3/uL — ABNORMAL HIGH (ref 0.00–0.07)
Basophils Absolute: 0.1 10*3/uL (ref 0.0–0.1)
Basophils Relative: 0 %
Eosinophils Absolute: 0 10*3/uL (ref 0.0–0.5)
Eosinophils Relative: 0 %
HCT: 29.4 % — ABNORMAL LOW (ref 39.0–52.0)
Hemoglobin: 9.3 g/dL — ABNORMAL LOW (ref 13.0–17.0)
Immature Granulocytes: 2 %
Lymphocytes Relative: 12 %
Lymphs Abs: 2.2 10*3/uL (ref 0.7–4.0)
MCH: 25.8 pg — ABNORMAL LOW (ref 26.0–34.0)
MCHC: 31.6 g/dL (ref 30.0–36.0)
MCV: 81.4 fL (ref 80.0–100.0)
Monocytes Absolute: 1 10*3/uL (ref 0.1–1.0)
Monocytes Relative: 5 %
Neutro Abs: 15.7 10*3/uL — ABNORMAL HIGH (ref 1.7–7.7)
Neutrophils Relative %: 81 %
Platelets: 528 10*3/uL — ABNORMAL HIGH (ref 150–400)
RBC: 3.61 MIL/uL — ABNORMAL LOW (ref 4.22–5.81)
RDW: 15 % (ref 11.5–15.5)
WBC: 19.4 10*3/uL — ABNORMAL HIGH (ref 4.0–10.5)
nRBC: 0 % (ref 0.0–0.2)

## 2020-11-08 LAB — COMPREHENSIVE METABOLIC PANEL
ALT: 23 U/L (ref 0–44)
AST: 16 U/L (ref 15–41)
Albumin: 2.6 g/dL — ABNORMAL LOW (ref 3.5–5.0)
Alkaline Phosphatase: 149 U/L — ABNORMAL HIGH (ref 38–126)
Anion gap: 15 (ref 5–15)
BUN: 74 mg/dL — ABNORMAL HIGH (ref 8–23)
CO2: 16 mmol/L — ABNORMAL LOW (ref 22–32)
Calcium: 8.9 mg/dL (ref 8.9–10.3)
Chloride: 104 mmol/L (ref 98–111)
Creatinine, Ser: 3.03 mg/dL — ABNORMAL HIGH (ref 0.61–1.24)
GFR, Estimated: 22 mL/min — ABNORMAL LOW (ref >60)
Glucose, Bld: 150 mg/dL — ABNORMAL HIGH (ref 70–99)
Potassium: 5.2 mmol/L — ABNORMAL HIGH (ref 3.5–5.1)
Sodium: 135 mmol/L (ref 135–145)
Total Bilirubin: 1 mg/dL (ref 0.3–1.2)
Total Protein: 7 g/dL (ref 6.5–8.1)

## 2020-11-08 LAB — RESP PANEL BY RT-PCR (FLU A&B, COVID) ARPGX2
Influenza A by PCR: NEGATIVE
Influenza B by PCR: NEGATIVE
SARS Coronavirus 2 by RT PCR: NEGATIVE

## 2020-11-08 LAB — URINALYSIS, ROUTINE W REFLEX MICROSCOPIC
Bilirubin Urine: NEGATIVE
Glucose, UA: NEGATIVE mg/dL
Ketones, ur: NEGATIVE mg/dL
Nitrite: NEGATIVE
Protein, ur: 30 mg/dL — AB
RBC / HPF: >50 RBC/hpf — ABNORMAL HIGH (ref 0–5)
Specific Gravity, Urine: 1.01 (ref 1.005–1.030)
WBC, UA: >50 WBC/hpf — ABNORMAL HIGH (ref 0–5)
pH: 6 (ref 5.0–8.0)

## 2020-11-08 LAB — CREATININE, SERUM
Creatinine, Ser: 2.6 mg/dL — ABNORMAL HIGH (ref 0.61–1.24)
GFR, Estimated: 26 mL/min — ABNORMAL LOW (ref >60)

## 2020-11-08 LAB — CBG MONITORING, ED: Glucose-Capillary: 143 mg/dL — ABNORMAL HIGH (ref 70–99)

## 2020-11-08 LAB — LIPASE, BLOOD: Lipase: 36 U/L (ref 11–51)

## 2020-11-08 LAB — AMMONIA: Ammonia: 25 umol/L (ref 9–35)

## 2020-11-08 LAB — LACTIC ACID, PLASMA: Lactic Acid, Venous: 1 mmol/L (ref 0.5–1.9)

## 2020-11-08 MED ORDER — ACETAMINOPHEN 325 MG PO TABS
650.0000 mg | ORAL_TABLET | Freq: Four times a day (QID) | ORAL | Status: DC | PRN
Start: 2020-11-08 — End: 2020-11-18
  Administered 2020-11-10: 650 mg via ORAL
  Filled 2020-11-08: qty 2

## 2020-11-08 MED ORDER — SODIUM CHLORIDE 0.9 % IV BOLUS
1000.0000 mL | Freq: Once | INTRAVENOUS | Status: AC
  Administered 2020-11-08: 18:00:00 1000 mL via INTRAVENOUS

## 2020-11-08 MED ORDER — HEPARIN SODIUM (PORCINE) 5000 UNIT/ML IJ SOLN
5000.0000 [IU] | Freq: Three times a day (TID) | INTRAMUSCULAR | Status: DC
  Administered 2020-11-08 – 2020-11-14 (×18): 5000 [IU] via SUBCUTANEOUS
  Filled 2020-11-08 (×19): qty 1

## 2020-11-08 MED ORDER — SODIUM CHLORIDE 0.9 % IV SOLN: INTRAVENOUS | Status: AC

## 2020-11-08 MED ORDER — HYDRALAZINE HCL 20 MG/ML IJ SOLN: 10.0000 mg | INTRAMUSCULAR | Status: DC | PRN

## 2020-11-08 MED ORDER — SODIUM CHLORIDE 0.9 % IV SOLN: INTRAVENOUS | Status: DC

## 2020-11-08 MED ORDER — ACETAMINOPHEN 650 MG RE SUPP: 650.0000 mg | Freq: Four times a day (QID) | RECTAL | Status: DC | PRN

## 2020-11-08 MED ORDER — SODIUM CHLORIDE 0.9 % IV SOLN
1.0000 g | INTRAVENOUS | Status: DC
  Administered 2020-11-08: 23:00:00 1 g via INTRAVENOUS
  Filled 2020-11-08 (×2): qty 1

## 2020-11-08 MED ORDER — SODIUM CHLORIDE 0.9 % IV SOLN
1.0000 g | Freq: Once | INTRAVENOUS | Status: AC
  Administered 2020-11-08: 18:00:00 1 g via INTRAVENOUS
  Filled 2020-11-08: qty 10

## 2020-11-08 NOTE — Progress Notes (Signed)
Pharmacy Antibiotic Note  Ricky Hanson is a 69 y.o. male admitted on 11/08/2020 with UTI.  Pharmacy has been consulted for Cefepime dosing.   Height: 6\' 1"  (185.4 cm) Weight: 86.2 kg (190 lb) IBW/kg (Calculated) : 79.9  Temp (24hrs), Avg:98.6 F (37 C), Min:98.6 F (37 C), Max:98.6 F (37 C)  Recent Labs  Lab 11/08/20 1640  WBC 19.4*  CREATININE 3.03*  LATICACIDVEN 1.0    Estimated Creatinine Clearance: 26.7 mL/min (A) (by C-G formula based on SCr of 3.03 mg/dL (H)).    No Known Allergies  Antimicrobials this admission: 12/15 Cefepime >>   Dose adjustments this admission: N/a  Microbiology results: Pending   Plan:  - Cefepime 1g IV q24h - Monitor patients renal function and urine output  - De-escalate ABX when appropriate   Thank you for allowing pharmacy to be a part of this patient's care.  1/16 PharmD. BCPS 11/08/2020 9:47 PM
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
## 2020-11-08 NOTE — ED Notes (Signed)
Water provided to patient, okay per MD

## 2020-11-08 NOTE — H&P (Signed)
History and Physical    Ricky Hanson KDT:[id number] DOB: 1951-03-29 DOA: 11/08/2020  PCP: Hoy Register, MD  Patient coming from: Skilled nursing facility.  Chief Complaint: Change in mental status.  HPI: Ricky Hanson is a 69 y.o. 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  As per the record patient had Foley catheter changed yesterday.  Per ER physician patient also complained of some pain around the penis.  Patient has chronic indwelling Foley catheter.  Patient denies any nausea vomiting abdominal pain diarrhea chest pain.  ED Course: In the ER patient was alert awake oriented to his name and place.  Labs are significant for acute renal failure with creatinine of 3.03 which increased from June when it was 1.1.  Ammonia levels are normal.  Potassium was 5.2 bicarb 19 anion gap 10.  EKG showed normal sinus rhythm WBC showed leukocytosis with hemoglobin around 8.3 which is baseline.  Patient was given fluid bolus for acute renal failure started on empiric antibiotics for UTI admitted for further management.  Review of Systems: As per HPI, rest all negative.   Past Medical History:  Diagnosis Date  . Diabetes mellitus without complication (HCC)   . Elevated random blood glucose level   . Foley catheter in place   . Hyperlipidemia   . Hypertension   . Schizophrenia [facility])     Past Surgical History:  Procedure Laterality Date  . ABDOMINAL AORTOGRAM W/LOWER EXTREMITY N/A 05/17/2020   Procedure: ABDOMINAL AORTOGRAM W/LOWER EXTREMITY;  Surgeon: Yates Decamp, MD;  Location: [facility];  Service: Cardiovascular;  Laterality: N/A;  . AMPUTATION Right 05/19/2020   Procedure: RIGHT BELOW KNEE AMPUTATION;  Surgeon: Nadara Mustard, MD;   Location: [facility] OR;  Service: Orthopedics;  Laterality: Right;  . PERIPHERAL VASCULAR BALLOON ANGIOPLASTY  05/17/2020   Procedure: PERIPHERAL VASCULAR BALLOON ANGIOPLASTY;  Surgeon: Yates Decamp, MD;  Location: [facility];  Service: Cardiovascular;;  Right PT  . urologic procedure after trauma       reports that he has been smoking cigarettes. He has been smoking about 0.50 packs per day. He has never used smokeless tobacco. He reports that he does not drink alcohol and does not use drugs.  No Known Allergies  History reviewed. No pertinent family history.  Prior to Admission medications   Medication Sig Start Date End Date Taking? Authorizing Provider  acetaminophen (TYLENOL) 500 MG tablet Take 1,000 mg by mouth every 6 (six) hours as needed for mild pain.    [provider]  amLODipine (NORVASC) 10 MG tablet Take 10 mg by mouth daily.    [provider]  aspirin 81 MG chewable tablet Chew 1 tablet (81 mg total) by mouth daily. 05/24/20   Derrel Nip, MD  atorvastatin (LIPITOR) 20 MG tablet Take 1 tablet (20 mg total) by mouth daily. 05/24/20   Derrel Nip, MD  benztropine (COGENTIN) 1 MG tablet Take 1 mg by mouth at bedtime.    [provider]  haloperidol decanoate (HALDOL DECANOATE) 100 MG/ML injection Inject 100 mg into the muscle every 28 (twenty-eight) days. Inject 2 mLs every 4 weeks    [provider]  lisinopril (ZESTRIL) 10 MG tablet Take 10 mg by mouth daily.    [provider]    Physical  Exam: Constitutional: Moderately built and nourished. Vitals:   11/08/20 1730 11/08/20 1800 11/08/20 1815 11/08/20 1830  BP: 120/88 123/65 134/68 127/63  Pulse: 87 87 87 86  Resp: (!) 26 13 13 11   Temp:      TempSrc:      SpO2: 100% 100% 100% 100%  Weight:      Height:       Eyes: Anicteric no pallor. ENMT: No discharge from the ears eyes nose or mouth. Neck: No mass felt.  No neck rigidity. Respiratory: No rhonchi or  crepitations. Cardiovascular: S1-S2 heard. Abdomen: Soft nontender bowel sounds present.  Foley catheter seen with no surrounding discharge. Musculoskeletal: Right BKA.  Stump looks clean. Skin: Right BKA stump looks clean. Neurologic: Alert awake oriented to his name and place moving all extremities. Psychiatric: Mildly confused.   Labs on Admission: I have personally reviewed following labs and imaging studies  CBC: Recent Labs  Lab 11/08/20 1640  WBC 19.4*  NEUTROABS 15.7*  HGB 9.3*  HCT 29.4*  MCV 81.4  PLT 528*   Basic Metabolic Panel: Recent Labs  Lab 11/08/20 1640  NA 135  K 5.2*  CL 104  CO2 16*  GLUCOSE 150*  BUN 74*  CREATININE 3.03*  CALCIUM 8.9   GFR: Estimated Creatinine Clearance: 26.7 mL/min (A) (by C-G formula based on SCr of 3.03 mg/dL (H)). Liver Function Tests: Recent Labs  Lab 11/08/20 1640  AST 16  ALT 23  ALKPHOS 149*  BILITOT 1.0  PROT 7.0  ALBUMIN 2.6*   Recent Labs  Lab 11/08/20 1640  LIPASE 36   Recent Labs  Lab 11/08/20 1640  AMMONIA 25   Coagulation Profile: No results for input(s): INR, PROTIME in the last 168 hours. Cardiac Enzymes: No results for input(s): CKTOTAL, CKMB, CKMBINDEX, TROPONINI in the last 168 hours. BNP (last 3 results) No results for input(s): PROBNP in the last 8760 hours. HbA1C: No results for input(s): HGBA1C in the last 72 hours. CBG: Recent Labs  Lab 11/08/20 1622  GLUCAP 143*   Lipid Profile: No results for input(s): CHOL, HDL, LDLCALC, TRIG, CHOLHDL, LDLDIRECT in the last 72 hours. Thyroid Function Tests: No results for input(s): TSH, T4TOTAL, FREET4, T3FREE, THYROIDAB in the last 72 hours. Anemia Panel: No results for input(s): VITAMINB12, FOLATE, FERRITIN, TIBC, IRON, RETICCTPCT in the last 72 hours. Urine analysis:    Component Value Date/Time   COLORURINE AMBER (A) 11/08/2020 1800   APPEARANCEUR TURBID (A) 11/08/2020 1800   LABSPEC 1.010 11/08/2020 1800   PHURINE 6.0  11/08/2020 1800   GLUCOSEU NEGATIVE 11/08/2020 1800   HGBUR MODERATE (A) 11/08/2020 1800   BILIRUBINUR NEGATIVE 11/08/2020 1800   KETONESUR NEGATIVE 11/08/2020 1800   PROTEINUR 30 (A) 11/08/2020 1800   NITRITE NEGATIVE 11/08/2020 1800   LEUKOCYTESUR LARGE (A) 11/08/2020 1800   Sepsis Labs: @LABRCNTIP (procalcitonin:4,lacticidven:4) ) Recent Results (from the past 240 hour(s))  Culture, blood (routine x 2)     Status: None (Preliminary result)   Collection Time: 11/08/20  4:01 PM   Specimen: BLOOD  Result Value Ref Range Status   Specimen Description BLOOD RIGHT SHOULDER  Final   Special Requests   Final    BOTTLES DRAWN AEROBIC AND ANAEROBIC Blood Culture results may not be optimal due to an inadequate volume of blood received in culture bottles Performed at [facility], [address], [place], [address] [place]    Culture PENDING  Incomplete   Report Status PENDING  Incomplete  Resp Panel by  RT-PCR (Flu A&B, Covid) Nasopharyngeal Swab     Status: None   Collection Time: 11/08/20  6:20 PM   Specimen: Nasopharyngeal Swab; Nasopharyngeal(NP) swabs in vial transport medium  Result Value Ref Range Status   SARS Coronavirus 2 by RT PCR NEGATIVE NEGATIVE Final    Comment: (NOTE) SARS-CoV-2 target nucleic acids are NOT DETECTED.  The SARS-CoV-2 RNA is generally detectable in upper respiratory specimens during the acute phase of infection. The lowest concentration of SARS-CoV-2 viral copies this assay can detect is 138 copies/mL. A negative result does not preclude SARS-Cov-2 infection and should not be used as the sole basis for treatment or other patient management decisions. A negative result may occur with  improper specimen collection/handling, submission of specimen other than nasopharyngeal swab, presence of viral mutation(s) within the areas targeted by this assay, and inadequate number of viral copies(<138 copies/mL). A negative result must be combined with clinical  observations, patient history, and epidemiological information. The expected result is Negative.  Fact Sheet for Patients:  BloggerCourse.com  Fact Sheet for Healthcare Providers:  SeriousBroker.it  This test is no t yet approved or cleared by the [place] FDA and  has been authorized for detection and/or diagnosis of SARS-CoV-2 by FDA under an Emergency Use Authorization (EUA). This EUA will remain  in effect (meaning this test can be used) for the duration of the COVID-19 declaration under Section 564(b)(1) of the Act, 21 U.S.C.section 360bbb-3(b)(1), unless the authorization is terminated  or revoked sooner.       Influenza A by PCR NEGATIVE NEGATIVE Final   Influenza B by PCR NEGATIVE NEGATIVE Final    Comment: (NOTE) The Xpert Xpress SARS-CoV-2/FLU/RSV plus assay is intended as an aid in the diagnosis of influenza from Nasopharyngeal swab specimens and should not be used as a sole basis for treatment. Nasal washings and aspirates are unacceptable for Xpert Xpress SARS-CoV-2/FLU/RSV testing.  Fact Sheet for Patients: BloggerCourse.com  Fact Sheet for Healthcare Providers: SeriousBroker.it  This test is not yet approved or cleared by the [place] FDA and has been authorized for detection and/or diagnosis of SARS-CoV-2 by FDA under an Emergency Use Authorization (EUA). This EUA will remain in effect (meaning this test can be used) for the duration of the COVID-19 declaration under Section 564(b)(1) of the Act, 21 U.S.C. section 360bbb-3(b)(1), unless the authorization is terminated or revoked.  Performed at [facility], [address] [place], [place] [address]      Radiological Exams on Admission: DG Chest Port 1 View  Result Date: 11/08/2020 CLINICAL DATA:  Lethargy EXAM: PORTABLE CHEST 1 VIEW COMPARISON:  05/16/2020 FINDINGS: Low lung volumes.  Hazy atelectasis at the bases. Bronchovascular crowding likely due to low lung volume. Normal heart size. Aortic atherosclerosis. IMPRESSION: Low lung volumes with hazy atelectasis at the bases. Electronically Signed   By: Jasmine Pang M.D.   On: 11/08/2020 16:21    EKG: Independently reviewed.  Sinus rhythm.  Assessment/Plan Principal Problem:   ARF (acute renal failure) (HCC) Active Problems:   Chronic indwelling Foley catheter   Peripheral vascular disease (HCC)   CKD (chronic kidney disease), stage III (HCC)   Essential hypertension    1. Acute on chronic kidney disease stage III with mild hyperkalemia likely from obstruction due to misplaced Foley catheter -after admission I had a CT renal study done which showed patient's Foley catheters balloon was blown in the urethra.  No active bleeding seen.  I discussed with on-call urologist Dr. Hart Robinsons  who at this time advised to deflate the balloon and put the Foley in and then reinflated the balloon.  Following which patient's urine has drained about 1800 cc.  Will be getting bladder scan to make sure the urine is adequately draining.  Follow intake output metabolic panel. 2. UTI on cefepime.  Follow urine cultures. 3. Acute encephalopathy likely from metabolic reasons and UTI.  Closely monitor. 4. History of schizophrenia Home medication needs to be verified. 5. History of hypertension we will hold off patient's lisinopril since patient has acute renal failure and keep patient on as needed IV hydralazine for now. 6. Chronic anemia follow CBC. 7. History of peripheral vascular disease.  Home medication doses has to be verified.   DVT prophylaxis: Heparin. Code Status: Full code. Family Communication: Need to discuss with patient's family. Disposition Plan: Back to facility when stable. Consults called: Discussed with on-call urologist. Admission status: Observation.   Eduard ClosArshad N Addy Mcmannis MD [facility] Pager [phone]  [phone].  If 7PM-7AM, please contact night-coverage [email or web address] Password [email or web address]  11/08/2020, 9:41 PM

## 2020-11-08 NOTE — ED Notes (Signed)
At change of shift, pt resting comfortably, watching tv, nadn, vss on ccm. Side rails up, call bell in place.

## 2020-11-08 NOTE — ED Notes (Signed)
Admitting at bedside 

## 2020-11-08 NOTE — ED Triage Notes (Signed)
Pt BIB [facility] from [facility] for AMS  Pt has been, per staff at facility, altered from baseline. Pt is alert & oriented to baseline currently but according to facility pt has been acting differently.   VSS with EMS. No complaints from patient at this time.

## 2020-11-09 ENCOUNTER — Observation Stay (HOSPITAL_COMMUNITY): Payer: Medicare Other

## 2020-11-09 DIAGNOSIS — I130 Hypertensive heart and chronic kidney disease with heart failure and stage 1 through stage 4 chronic kidney disease, or unspecified chronic kidney disease: Secondary | ICD-10-CM | POA: Diagnosis present

## 2020-11-09 DIAGNOSIS — J9690 Respiratory failure, unspecified, unspecified whether with hypoxia or hypercapnia: Secondary | ICD-10-CM | POA: Diagnosis not present

## 2020-11-09 DIAGNOSIS — G9341 Metabolic encephalopathy: Secondary | ICD-10-CM | POA: Diagnosis present

## 2020-11-09 DIAGNOSIS — I5033 Acute on chronic diastolic (congestive) heart failure: Secondary | ICD-10-CM | POA: Diagnosis present

## 2020-11-09 DIAGNOSIS — J939 Pneumothorax, unspecified: Secondary | ICD-10-CM | POA: Diagnosis not present

## 2020-11-09 DIAGNOSIS — Z978 Presence of other specified devices: Secondary | ICD-10-CM | POA: Diagnosis not present

## 2020-11-09 DIAGNOSIS — N183 Chronic kidney disease, stage 3 unspecified: Secondary | ICD-10-CM

## 2020-11-09 DIAGNOSIS — N390 Urinary tract infection, site not specified: Secondary | ICD-10-CM | POA: Diagnosis not present

## 2020-11-09 DIAGNOSIS — Z66 Do not resuscitate: Secondary | ICD-10-CM | POA: Diagnosis present

## 2020-11-09 DIAGNOSIS — G929 Unspecified toxic encephalopathy: Secondary | ICD-10-CM | POA: Diagnosis not present

## 2020-11-09 DIAGNOSIS — J690 Pneumonitis due to inhalation of food and vomit: Secondary | ICD-10-CM | POA: Diagnosis not present

## 2020-11-09 DIAGNOSIS — K4030 Unilateral inguinal hernia, with obstruction, without gangrene, not specified as recurrent: Secondary | ICD-10-CM | POA: Diagnosis not present

## 2020-11-09 DIAGNOSIS — I639 Cerebral infarction, unspecified: Secondary | ICD-10-CM | POA: Diagnosis not present

## 2020-11-09 DIAGNOSIS — I462 Cardiac arrest due to underlying cardiac condition: Secondary | ICD-10-CM | POA: Diagnosis not present

## 2020-11-09 DIAGNOSIS — I739 Peripheral vascular disease, unspecified: Secondary | ICD-10-CM | POA: Diagnosis not present

## 2020-11-09 DIAGNOSIS — K922 Gastrointestinal hemorrhage, unspecified: Secondary | ICD-10-CM | POA: Diagnosis not present

## 2020-11-09 DIAGNOSIS — K56600 Partial intestinal obstruction, unspecified as to cause: Secondary | ICD-10-CM | POA: Diagnosis not present

## 2020-11-09 DIAGNOSIS — G931 Anoxic brain damage, not elsewhere classified: Secondary | ICD-10-CM | POA: Diagnosis not present

## 2020-11-09 DIAGNOSIS — Z7189 Other specified counseling: Secondary | ICD-10-CM | POA: Diagnosis not present

## 2020-11-09 DIAGNOSIS — I469 Cardiac arrest, cause unspecified: Secondary | ICD-10-CM | POA: Diagnosis not present

## 2020-11-09 DIAGNOSIS — I6381 Other cerebral infarction due to occlusion or stenosis of small artery: Secondary | ICD-10-CM | POA: Diagnosis not present

## 2020-11-09 DIAGNOSIS — N1832 Chronic kidney disease, stage 3b: Secondary | ICD-10-CM | POA: Diagnosis not present

## 2020-11-09 DIAGNOSIS — Z515 Encounter for palliative care: Secondary | ICD-10-CM | POA: Diagnosis not present

## 2020-11-09 DIAGNOSIS — F05 Delirium due to known physiological condition: Secondary | ICD-10-CM | POA: Diagnosis not present

## 2020-11-09 DIAGNOSIS — J9601 Acute respiratory failure with hypoxia: Secondary | ICD-10-CM | POA: Diagnosis not present

## 2020-11-09 DIAGNOSIS — N136 Pyonephrosis: Secondary | ICD-10-CM | POA: Diagnosis present

## 2020-11-09 DIAGNOSIS — Z20822 Contact with and (suspected) exposure to covid-19: Secondary | ICD-10-CM | POA: Diagnosis present

## 2020-11-09 DIAGNOSIS — I4901 Ventricular fibrillation: Secondary | ICD-10-CM | POA: Diagnosis not present

## 2020-11-09 DIAGNOSIS — I10 Essential (primary) hypertension: Secondary | ICD-10-CM | POA: Diagnosis not present

## 2020-11-09 DIAGNOSIS — N179 Acute kidney failure, unspecified: Secondary | ICD-10-CM | POA: Diagnosis present

## 2020-11-09 DIAGNOSIS — E43 Unspecified severe protein-calorie malnutrition: Secondary | ICD-10-CM | POA: Diagnosis not present

## 2020-11-09 DIAGNOSIS — I472 Ventricular tachycardia: Secondary | ICD-10-CM | POA: Diagnosis not present

## 2020-11-09 DIAGNOSIS — D62 Acute posthemorrhagic anemia: Secondary | ICD-10-CM | POA: Diagnosis not present

## 2020-11-09 DIAGNOSIS — T83511A Infection and inflammatory reaction due to indwelling urethral catheter, initial encounter: Secondary | ICD-10-CM | POA: Diagnosis present

## 2020-11-09 LAB — COMPREHENSIVE METABOLIC PANEL
ALT: 18 U/L (ref 0–44)
AST: 12 U/L — ABNORMAL LOW (ref 15–41)
Albumin: 2.4 g/dL — ABNORMAL LOW (ref 3.5–5.0)
Alkaline Phosphatase: 132 U/L — ABNORMAL HIGH (ref 38–126)
Anion gap: 10 (ref 5–15)
BUN: 57 mg/dL — ABNORMAL HIGH (ref 8–23)
CO2: 19 mmol/L — ABNORMAL LOW (ref 22–32)
Calcium: 8.7 mg/dL — ABNORMAL LOW (ref 8.9–10.3)
Chloride: 110 mmol/L (ref 98–111)
Creatinine, Ser: 2.22 mg/dL — ABNORMAL HIGH (ref 0.61–1.24)
GFR, Estimated: 32 mL/min — ABNORMAL LOW (ref >60)
Glucose, Bld: 135 mg/dL — ABNORMAL HIGH (ref 70–99)
Potassium: 5.2 mmol/L — ABNORMAL HIGH (ref 3.5–5.1)
Sodium: 139 mmol/L (ref 135–145)
Total Bilirubin: 0.6 mg/dL (ref 0.3–1.2)
Total Protein: 6.4 g/dL — ABNORMAL LOW (ref 6.5–8.1)

## 2020-11-09 LAB — CBC WITH DIFFERENTIAL/PLATELET
Abs Immature Granulocytes: 0.38 10*3/uL — ABNORMAL HIGH (ref 0.00–0.07)
Basophils Absolute: 0.1 10*3/uL (ref 0.0–0.1)
Basophils Relative: 0 %
Eosinophils Absolute: 0 10*3/uL (ref 0.0–0.5)
Eosinophils Relative: 0 %
HCT: 27.3 % — ABNORMAL LOW (ref 39.0–52.0)
Hemoglobin: 8.9 g/dL — ABNORMAL LOW (ref 13.0–17.0)
Immature Granulocytes: 2 %
Lymphocytes Relative: 12 %
Lymphs Abs: 2.1 10*3/uL (ref 0.7–4.0)
MCH: 26.1 pg (ref 26.0–34.0)
MCHC: 32.6 g/dL (ref 30.0–36.0)
MCV: 80.1 fL (ref 80.0–100.0)
Monocytes Absolute: 1 10*3/uL (ref 0.1–1.0)
Monocytes Relative: 6 %
Neutro Abs: 14.6 10*3/uL — ABNORMAL HIGH (ref 1.7–7.7)
Neutrophils Relative %: 80 %
Platelets: 545 10*3/uL — ABNORMAL HIGH (ref 150–400)
RBC: 3.41 MIL/uL — ABNORMAL LOW (ref 4.22–5.81)
RDW: 15.1 % (ref 11.5–15.5)
WBC: 18.2 10*3/uL — ABNORMAL HIGH (ref 4.0–10.5)
nRBC: 0 % (ref 0.0–0.2)

## 2020-11-09 LAB — TSH: TSH: 0.597 u[IU]/mL (ref 0.350–4.500)

## 2020-11-09 LAB — HEMOGLOBIN A1C
Hgb A1c MFr Bld: 5.7 % — ABNORMAL HIGH (ref 4.8–5.6)
Mean Plasma Glucose: 116.89 mg/dL

## 2020-11-09 MED ORDER — SODIUM CHLORIDE 0.9 % IV SOLN
2.0000 g | Freq: Two times a day (BID) | INTRAVENOUS | Status: AC
  Administered 2020-11-09 – 2020-11-14 (×12): 2 g via INTRAVENOUS
  Filled 2020-11-09 (×12): qty 2

## 2020-11-09 MED ORDER — BENZTROPINE MESYLATE 1 MG PO TABS
1.0000 mg | ORAL_TABLET | Freq: Every day | ORAL | Status: DC
  Administered 2020-11-09 – 2020-11-15 (×7): 1 mg via ORAL
  Filled 2020-11-09 (×7): qty 1

## 2020-11-09 NOTE — ED Notes (Signed)
Breakfast ordered 

## 2020-11-09 NOTE — ED Notes (Signed)
Assisted patient with dinner tray,

## 2020-11-09 NOTE — ED Notes (Signed)
Pt to CT in stable condition via stretcher
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
## 2020-11-09 NOTE — ED Notes (Signed)
Lunch Tray Ordered @ 1032. 

## 2020-11-09 NOTE — ED Notes (Signed)
Family at bedside. 

## 2020-11-10 LAB — BASIC METABOLIC PANEL
Anion gap: 8 (ref 5–15)
BUN: 31 mg/dL — ABNORMAL HIGH (ref 8–23)
CO2: 19 mmol/L — ABNORMAL LOW (ref 22–32)
Calcium: 8.4 mg/dL — ABNORMAL LOW (ref 8.9–10.3)
Chloride: 109 mmol/L (ref 98–111)
Creatinine, Ser: 1.46 mg/dL — ABNORMAL HIGH (ref 0.61–1.24)
GFR, Estimated: 52 mL/min — ABNORMAL LOW (ref >60)
Glucose, Bld: 153 mg/dL — ABNORMAL HIGH (ref 70–99)
Potassium: 4.6 mmol/L (ref 3.5–5.1)
Sodium: 136 mmol/L (ref 135–145)

## 2020-11-10 LAB — CBC
HCT: 22.8 % — ABNORMAL LOW (ref 39.0–52.0)
Hemoglobin: 7.9 g/dL — ABNORMAL LOW (ref 13.0–17.0)
MCH: 26.5 pg (ref 26.0–34.0)
MCHC: 34.6 g/dL (ref 30.0–36.0)
MCV: 76.5 fL — ABNORMAL LOW (ref 80.0–100.0)
Platelets: 506 10*3/uL — ABNORMAL HIGH (ref 150–400)
RBC: 2.98 MIL/uL — ABNORMAL LOW (ref 4.22–5.81)
RDW: 14.9 % (ref 11.5–15.5)
WBC: 14.8 10*3/uL — ABNORMAL HIGH (ref 4.0–10.5)
nRBC: 0 % (ref 0.0–0.2)

## 2020-11-10 LAB — GLUCOSE, CAPILLARY: Glucose-Capillary: 164 mg/dL — ABNORMAL HIGH (ref 70–99)

## 2020-11-10 LAB — RAPID URINE DRUG SCREEN, HOSP PERFORMED
Amphetamines: NOT DETECTED
Barbiturates: NOT DETECTED
Benzodiazepines: NOT DETECTED
Cocaine: NOT DETECTED
Opiates: NOT DETECTED
Tetrahydrocannabinol: NOT DETECTED

## 2020-11-10 LAB — MRSA PCR SCREENING: MRSA by PCR: NEGATIVE

## 2020-11-10 LAB — IRON AND TIBC
Iron: 42 ug/dL — ABNORMAL LOW (ref 45–182)
Saturation Ratios: 24 % (ref 17.9–39.5)
TIBC: 174 ug/dL — ABNORMAL LOW (ref 250–450)
UIBC: 132 ug/dL

## 2020-11-10 LAB — FERRITIN: Ferritin: 688 ng/mL — ABNORMAL HIGH (ref 24–336)
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
## 2020-11-10 NOTE — [place] FL2 (Signed)
[place] MEDICAID [id number] LEVEL OF CARE SCREENING TOOL     IDENTIFICATION  Patient Name: Ricky Hanson Birthdate: [birth year] Sex: male Admission Date (Current Location): 11/08/2020  [facility] and [place] Number:  Producer, television/film/video and Address:  [facility], [address] [place], [place] [address]      Provider Number: [id number]  Attending Physician Name and Address:  Joseph Art, DO  Relative Name and Phone Number:  Loura Halt [facility])   [phone] [facility])    Current Level of Care: Hospital Recommended Level of Care: Skilled Nursing Facility Prior Approval Number: [id number]  Date Approved/Denied: 11/10/20 PASRR Number: [id number]  Discharge Plan: SNF    Current Diagnoses: Patient Active Problem List   Diagnosis Date Noted  . AKI (acute kidney injury) (HCC) 11/09/2020  . ARF (acute renal failure) (HCC) 11/08/2020  . Essential hypertension 11/08/2020  . Pain due to onychomycosis of toenail of left foot 06/27/2020  . Osteomyelitis (HCC) 05/16/2020  . Sepsis (HCC) 05/16/2020  . Dry gangrene (HCC) 05/16/2020  . Hypertension associated with diabetes (HCC) 05/16/2020  . Dyslipidemia 05/16/2020  . Peripheral vascular disease (HCC) 05/16/2020  . Normocytic anemia 05/16/2020  . CKD (chronic kidney disease), stage III (HCC) 05/16/2020  . Acute kidney injury (HCC) 05/16/2020  . Pain due to onychomycosis of toenails of both feet 03/31/2020  . 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 Traumatic amputation of toe (HCC) 03/31/2020  . Chronic indwelling Foley catheter 02/02/2020    Orientation RESPIRATION BLADDER Height & Weight     Self,Time,Situation,Place  Normal Continent Weight: 171 lb 15.3 oz (78 kg) Height:  6\' 1"  (185.4 cm)  BEHAVIORAL SYMPTOMS/MOOD NEUROLOGICAL BOWEL NUTRITION STATUS      Continent Diet (See d/c summary)  AMBULATORY STATUS COMMUNICATION OF NEEDS Skin   Extensive Assist Verbally Normal                        Personal Care Assistance Level of Assistance  Bathing,Feeding,Dressing Bathing Assistance: Limited assistance Feeding assistance: Independent Dressing Assistance: Limited assistance     Functional Limitations Info  Sight,Hearing,Speech Sight Info: Adequate Hearing Info: Adequate Speech Info: Adequate    SPECIAL CARE FACTORS FREQUENCY  PT (By licensed PT),OT (By licensed OT)     PT Frequency: 5x/week OT Frequency: 5x/week            Contractures Contractures Info: Not present    Additional Factors Info  Code Status,Allergies,Psychotropic Code Status Info: Full Allergies Info: No known allergies Psychotropic Info: appears to get haldol q 28 days         Current Medications (11/10/2020):  This is the current hospital active medication list Current Facility-Administered Medications  Medication Dose Route Frequency Provider Last Rate Last Admin  . acetaminophen (TYLENOL) tablet 650 mg  650 mg Oral Q6H PRN 11/12/2020, MD   650 mg at 11/10/20 1014   Or  . acetaminophen (TYLENOL) suppository 650 mg  650 mg Rectal Q6H PRN 11/12/20, MD      . benztropine (COGENTIN) tablet 1 mg  1 mg Oral QHS Vann, Jessica U, DO   1 mg at 11/09/20 2253  . ceFEPIme (MAXIPIME) 2 g in sodium chloride 0.9 % 100 mL IVPB  2 g Intravenous Q12H 2254, RPH 200 mL/hr at 11/10/20 1015 2 g at 11/10/20 1015  . heparin injection 5,000 Units  5,000 Units Subcutaneous Q8H 11/12/20, MD   5,000 Units at 11/10/20 [phone]  .  hydrALAZINE (APRESOLINE) injection 10 mg  10 mg Intravenous Q4H PRN Eduard Clos, MD         Discharge Medications: Please see discharge summary for a list of discharge medications.  Relevant Imaging Results:  Relevant Lab Results:   Additional Information SSN [id number]  [facility], LCSW

## 2020-11-10 NOTE — Plan of Care (Signed)
  Problem: Self-Concept: Goal: Body image disturbance will be avoided or minimized Outcome: Completed/Met

## 2020-11-10 NOTE — TOC Initial Note (Addendum)
Transition of Care [facility]) - Initial/Assessment Note    Patient Details  Name: Ricky Hanson MRN: [id number] Date of Birth: Apr 13, 1951  Transition of Care [facility]) CM/SW Contact:    Bethann Berkshire, [place] Phone Number: 11/10/2020, 10:17 AM  Clinical Narrative:                  CSW met with pt to discuss disposition. Pt wants to return to [facility]. Pt gives consent for CSW to speak with his family members. CSW called pt son who is okay with pt returning to [facility]. Son states that pt was staying with his nephew Jaynee Eagles prior to SNF. Samuel's contact info is [phone]  1205: Fl2 complete and sent to [facility] in [place] confirmed pt's return. Will need to start auth prior to d/c  Expected Discharge Plan: [place] Barriers to Discharge: Continued Medical Work up   Patient Goals and CMS Choice Patient states their goals for this hospitalization and ongoing recovery are:: Return to [facility]      Expected Discharge Plan and Services Expected Discharge Plan: [place] arrangements for the past 2 months: [place]                                      Prior Living Arrangements/Services Living arrangements for the past 2 months: [place] Lives with:: Relatives,Facility Resident Patient language and need for interpreter reviewed:: Yes Do you feel safe going back to the place where you live?: Yes      Need for Family Participation in Patient Care: Yes (Comment) Care giver support system in place?: Yes (comment)   Criminal Activity/Legal Involvement Pertinent to Current Situation/Hospitalization: No - Comment as needed  Activities of Daily Living Home Assistive Devices/Equipment: Financial planner (Comment) (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) ADL Screening (condition at time of admission) Patient's cognitive ability adequate to safely complete daily activities?: Yes Is the  patient deaf or have difficulty hearing?: No Does the patient have difficulty seeing, even when wearing glasses/contacts?: No Does the patient have difficulty concentrating, remembering, or making decisions?: No Patient able to express need for assistance with ADLs?: Yes Does the patient have difficulty dressing or bathing?: No Independently performs ADLs?: No Communication: Independent Dressing (OT): Independent Grooming: Independent Feeding: Independent Bathing: Needs assistance Is this a change from baseline?: Pre-admission baseline Toileting: Needs assistance Is this a change from baseline?: Pre-admission baseline In/Out Bed: Needs assistance Is this a change from baseline?: Pre-admission baseline Walks in Home: Needs assistance Is this a change from baseline?: Pre-admission baseline Does the patient have difficulty walking or climbing stairs?: Yes Weakness of Legs: Right Weakness of Arms/Hands: None  Permission Sought/Granted   Permission granted to share information with : Yes, Verbal Permission Granted  Share Information with NAME: Family members           Emotional Assessment Appearance:: Appears stated age Attitude/Demeanor/Rapport: Lethargic Affect (typically observed): Flat Orientation: : Oriented to Self,Oriented to Place,Oriented to  Time,Oriented to Situation Alcohol / Substance Use: Not Applicable Psych Involvement: No (comment)  Admission diagnosis:  Lower urinary tract infectious disease [N39.0] ARF (acute renal failure) ([place]) [N17.9] AKI (acute kidney injury) ([place]) [N17.9] Patient Active Problem List   Diagnosis Date Noted  . AKI (acute kidney injury) ([place]) 11/09/2020  . ARF (acute renal failure) ([place]) 11/08/2020  .  Essential hypertension 11/08/2020  . Pain due to onychomycosis of toenail of left foot 06/27/2020  . Osteomyelitis ([place]) 05/16/2020  . Sepsis ([place]) 05/16/2020  . Dry gangrene ([place]) 05/16/2020  . Hypertension associated with diabetes  ([place]) 05/16/2020  . Dyslipidemia 05/16/2020  . Peripheral vascular disease ([place]) 05/16/2020  . Normocytic anemia 05/16/2020  . CKD (chronic kidney disease), stage III ([place]) 05/16/2020  . Acute kidney injury ([place]) 05/16/2020  . Pain due to onychomycosis of toenails of both feet 03/31/2020  . 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 Traumatic amputation of toe ([place]) 03/31/2020  . Chronic indwelling Foley catheter 02/02/2020   PCP:  Charlott Rakes, MD Pharmacy:   [facility], [address] [place] [address] Phone: [phone] Fax: [phone]     Social Determinants of Health (SDOH) Interventions    Readmission Risk Interventions No flowsheet data found.

## 2020-11-11 LAB — BASIC METABOLIC PANEL
Anion gap: 9 (ref 5–15)
BUN: 19 mg/dL (ref 8–23)
CO2: 20 mmol/L — ABNORMAL LOW (ref 22–32)
Calcium: 8.7 mg/dL — ABNORMAL LOW (ref 8.9–10.3)
Chloride: 109 mmol/L (ref 98–111)
Creatinine, Ser: 1.35 mg/dL — ABNORMAL HIGH (ref 0.61–1.24)
GFR, Estimated: 58 mL/min — ABNORMAL LOW (ref >60)
Glucose, Bld: 160 mg/dL — ABNORMAL HIGH (ref 70–99)
Potassium: 4.4 mmol/L (ref 3.5–5.1)
Sodium: 138 mmol/L (ref 135–145)

## 2020-11-11 LAB — CBC
HCT: 24.2 % — ABNORMAL LOW (ref 39.0–52.0)
Hemoglobin: 7.9 g/dL — ABNORMAL LOW (ref 13.0–17.0)
MCH: 25.6 pg — ABNORMAL LOW (ref 26.0–34.0)
MCHC: 32.6 g/dL (ref 30.0–36.0)
MCV: 78.6 fL — ABNORMAL LOW (ref 80.0–100.0)
Platelets: 468 10*3/uL — ABNORMAL HIGH (ref 150–400)
RBC: 3.08 MIL/uL — ABNORMAL LOW (ref 4.22–5.81)
RDW: 15.1 % (ref 11.5–15.5)
WBC: 14.4 10*3/uL — ABNORMAL HIGH (ref 4.0–10.5)
nRBC: 0 % (ref 0.0–0.2)

## 2020-11-11 LAB — URINE CULTURE: Culture: 80000 — AB

## 2020-11-11 MED ORDER — CHLORHEXIDINE GLUCONATE CLOTH 2 % EX PADS
6.0000 | MEDICATED_PAD | Freq: Every day | CUTANEOUS | Status: DC
  Administered 2020-11-11 – 2020-11-30 (×20): 6 via TOPICAL

## 2020-11-11 NOTE — Progress Notes (Signed)
Patient continues to have poor oral intake during meals. Assistance offered during both lunch and dinner but patient states he is not hungry. Apple sauce offered at both times and patient agreeable to 1 applesauce. Sprite also given twice

## 2020-11-11 NOTE — Plan of Care (Signed)
  Problem: Respiratory: Goal: Respiratory symptoms related to disease process will be avoided Outcome: Progressing   

## 2020-11-11 NOTE — Plan of Care (Signed)
  Problem: Activity: Goal: Activity intolerance will improve Outcome: Progressing   

## 2020-11-11 NOTE — Progress Notes (Signed)
Progress Note    Ricky Hanson  ERX:[id number] DOB: 1951-05-10  DOA: 11/08/2020 PCP: Hoy Register, MD    Brief Narrative:     Medical records reviewed and are as summarized below:  Ricky Hanson is an 69 y.o. 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  Found to have AKI from urinary retention due to misplaced foley and possible UTI.    Assessment/Plan:   Principal Problem:   ARF (acute renal failure) (HCC) Active Problems:   Chronic indwelling Foley catheter   Peripheral vascular disease (HCC)   CKD (chronic kidney disease), stage III (HCC)   Essential hypertension   AKI (acute kidney injury) (HCC)   Acute on chronic kidney disease stage III with mild hyperkalemia from obstruction due to misplaced Foley catheter  -CT renal study showed patient's Foley catheters balloon was blown in the urethra.   -on-call urologist Dr. Hart Robinsons: advised to deflate the balloon and put the Foley in and then reinflated the balloon- urine output immediately of 1800 cc.   -strict I/Os: good UOP -Cr trending down  UTI  -cefepime -Follow urine cultures- gram negative  Acute encephalopathy likely from metabolic reasons and UTI.  Closely monitor. -unclear what patient's baseline is  History of schizophrenia -appears to get haldol q 28 days -cogentin QHS  Hypertension -hold off patient's lisinopril  -hold norvasc   Chronic anemia -trend CBC  History of peripheral vascular disease    Family Communication/Anticipated D/C date and plan/Code Status   DVT prophylaxis: heparin Code Status: Full Code.  Disposition Plan: inpt  The patient will require care spanning > 2 midnights and should be inpatient because: IV treatments appropriate  due to intensity of illness or inability to take PO  Dispo: The patient is from: SNF              Anticipated d/c is to: SNF              Anticipated d/c date is: 3 days              Patient currently is not medically stable to d/c.         Medical Consultants:    Urology (phone)     Subjective:   Not eating much-- discussed changing his diet to regular with him  Objective:    Vitals:   11/10/20 1210 11/10/20 1556 11/11/20 0019 11/11/20 0724  BP: 135/67 134/65 (!) 154/66 132/69  Pulse: 75 76 78 86  Resp: 18 18 20 16   Temp: 99.2 F (37.3 C) 98.7 F (37.1 C) 98.5 F (36.9 C) 97.7 F (36.5 C)  TempSrc: Oral Oral Oral Oral  SpO2:   99% 100%  Weight:      Height:        Intake/Output Summary (Last 24 hours) at 11/11/2020 1029 Last data filed at 11/11/2020 0530 Gross per 24 hour  Intake 440 ml  Output 1600 ml  Net -1160 ml   Filed Weights   11/08/20 1604 11/09/20 1926 11/10/20 0020  Weight: 86.2 kg 77.9 kg 78 kg    Exam:   General: Appearance:    Chronically ill appearing male in no acute distress     Lungs:      respirations unlabored  Heart:    Normal heart rate. Normal  rhythm. No murmurs, rubs, or gallops.      Neurologic:   Awake, alert, poor eye contact     Data Reviewed:   I have personally reviewed following labs and imaging studies:  Labs: Labs show the following:   Basic Metabolic Panel: Recent Labs  Lab 11/08/20 1640 11/08/20 2139 11/09/20 0500 11/10/20 0319 11/11/20 0342  NA 135  --  139 136 138  K 5.2*  --  5.2* 4.6 4.4  CL 104  --  110 109 109  CO2 16*  --  19* 19* 20*  GLUCOSE 150*  --  135* 153* 160*  BUN 74*  --  57* 31* 19  CREATININE 3.03* 2.60* 2.22* 1.46* 1.35*  CALCIUM 8.9  --  8.7* 8.4* 8.7*   GFR Estimated Creatinine Clearance: 58.6 mL/min (A) (by C-G formula based on SCr of 1.35 mg/dL (H)). Liver Function Tests: Recent Labs  Lab 11/08/20 1640 11/09/20 0500  AST 16 12*  ALT 23 18  ALKPHOS 149* 132*   BILITOT 1.0 0.6  PROT 7.0 6.4*  ALBUMIN 2.6* 2.4*   Recent Labs  Lab 11/08/20 1640  LIPASE 36   Recent Labs  Lab 11/08/20 1640  AMMONIA 25   Coagulation profile No results for input(s): INR, PROTIME in the last 168 hours.  CBC: Recent Labs  Lab 11/08/20 1640 11/08/20 2139 11/09/20 0500 11/10/20 0319 11/11/20 0342  WBC 19.4* 18.3* 18.2* 14.8* 14.4*  NEUTROABS 15.7*  --  14.6*  --   --   HGB 9.3* 8.3* 8.9* 7.9* 7.9*  HCT 29.4* 25.6* 27.3* 22.8* 24.2*  MCV 81.4 79.0* 80.1 76.5* 78.6*  PLT 528* 499* 545* 506* 468*   Cardiac Enzymes: No results for input(s): CKTOTAL, CKMB, CKMBINDEX, TROPONINI in the last 168 hours. BNP (last 3 results) No results for input(s): PROBNP in the last 8760 hours. CBG: Recent Labs  Lab 11/08/20 1622 11/10/20 1209  GLUCAP 143* 164*   D-Dimer: No results for input(s): DDIMER in the last 72 hours. Hgb A1c: Recent Labs    11/08/20 2139  HGBA1C 5.7*   Lipid Profile: No results for input(s): CHOL, HDL, LDLCALC, TRIG, CHOLHDL, LDLDIRECT in the last 72 hours. Thyroid function studies: Recent Labs    11/09/20 0450  TSH 0.597   Anemia work up: Recent Labs    11/10/20 0319  FERRITIN 688*  TIBC 174*  IRON 42*   Sepsis Labs: Recent Labs  Lab 11/08/20 1640 11/08/20 2139 11/09/20 0500 11/10/20 0319 11/11/20 0342  WBC 19.4* 18.3* 18.2* 14.8* 14.4*  LATICACIDVEN 1.0  --   --   --   --     Microbiology Recent Results (from the past 240 hour(s))  Culture, blood (routine x 2)     Status: None (Preliminary result)   Collection Time: 11/08/20  4:01 PM   Specimen: BLOOD  Result Value Ref Range Status   Specimen Description BLOOD RIGHT SHOULDER  Final   Special Requests   Final    BOTTLES DRAWN AEROBIC AND ANAEROBIC Blood Culture results may not be optimal due to an inadequate volume of blood received in culture bottles   Culture   Final    NO GROWTH 2 DAYS Performed at [facility], [address] [place], [place]  [id number]    Report Status PENDING  Incomplete  Culture, blood (routine x 2)     Status: None (Preliminary result)   Collection Time: 11/08/20  4:37 PM   Specimen: BLOOD RIGHT ARM  Result Value  Ref Range Status   Specimen Description BLOOD RIGHT ARM  Final   Special Requests   Final    BOTTLES DRAWN AEROBIC AND ANAEROBIC Blood Culture results may not be optimal due to an inadequate volume of blood received in culture bottles   Culture   Final    NO GROWTH 2 DAYS Performed at [facility], [address], [place], [place] [address]    Report Status PENDING  Incomplete  Urine culture     Status: Abnormal   Collection Time: 11/08/20  5:46 PM   Specimen: Urine, Random  Result Value Ref Range Status   Specimen Description URINE, RANDOM  Final   Special Requests NONE  Final   Culture (A)  Final    80,000 COLONIES/mL PROVIDENCIA STUARTII 40,000 COLONIES/mL GROUP B STREP(S.AGALACTIAE)ISOLATED TESTING AGAINST S. AGALACTIAE NOT ROUTINELY PERFORMED DUE TO PREDICTABILITY OF AMP/PEN/VAN SUSCEPTIBILITY. Performed at [facility], [address], [place], [place] [address]    Report Status 11/11/2020 FINAL  Final   Organism ID, Bacteria PROVIDENCIA STUARTII (A)  Final      Susceptibility   Providencia stuartii - MIC*    AMPICILLIN RESISTANT Resistant     CEFAZOLIN >=64 RESISTANT Resistant     CEFEPIME <=0.12 SENSITIVE Sensitive     CEFTRIAXONE <=0.25 SENSITIVE Sensitive     CIPROFLOXACIN <=0.25 SENSITIVE Sensitive     GENTAMICIN >=16 RESISTANT Resistant     IMIPENEM 1 SENSITIVE Sensitive     NITROFURANTOIN 256 RESISTANT Resistant     TRIMETH/SULFA <=20 SENSITIVE Sensitive     AMPICILLIN/SULBACTAM 8 SENSITIVE Sensitive     PIP/TAZO <=4 SENSITIVE Sensitive     * 80,000 COLONIES/mL PROVIDENCIA STUARTII  Resp Panel by RT-PCR (Flu A&B, Covid) Nasopharyngeal Swab     Status: None   Collection Time: 11/08/20  6:20 PM   Specimen: Nasopharyngeal Swab; Nasopharyngeal(NP) swabs in vial  transport medium  Result Value Ref Range Status   SARS Coronavirus 2 by RT PCR NEGATIVE NEGATIVE Final    Comment: (NOTE) SARS-CoV-2 target nucleic acids are NOT DETECTED.  The SARS-CoV-2 RNA is generally detectable in upper respiratory specimens during the acute phase of infection. The lowest concentration of SARS-CoV-2 viral copies this assay can detect is 138 copies/mL. A negative result does not preclude SARS-Cov-2 infection and should not be used as the sole basis for treatment or other patient management decisions. A negative result may occur with  improper specimen collection/handling, submission of specimen other than nasopharyngeal swab, presence of viral mutation(s) within the areas targeted by this assay, and inadequate number of viral copies(<138 copies/mL). A negative result must be combined with clinical observations, patient history, and epidemiological information. The expected result is Negative.  Fact Sheet for Patients:  BloggerCourse.com  Fact Sheet for Healthcare Providers:  SeriousBroker.it  This test is no t yet approved or cleared by the [place] FDA and  has been authorized for detection and/or diagnosis of SARS-CoV-2 by FDA under an Emergency Use Authorization (EUA). This EUA will remain  in effect (meaning this test can be used) for the duration of the COVID-19 declaration under Section 564(b)(1) of the Act, 21 U.S.C.section 360bbb-3(b)(1), unless the authorization is terminated  or revoked sooner.       Influenza A by PCR NEGATIVE NEGATIVE Final   Influenza B by PCR NEGATIVE NEGATIVE Final    Comment: (NOTE) The Xpert Xpress SARS-CoV-2/FLU/RSV plus assay is intended as an aid in the diagnosis of influenza from Nasopharyngeal swab specimens  and should not be used as a sole basis for treatment. Nasal washings and aspirates are unacceptable for Xpert Xpress SARS-CoV-2/FLU/RSV testing.  Fact  Sheet for Patients: BloggerCourse.com  Fact Sheet for Healthcare Providers: SeriousBroker.it  This test is not yet approved or cleared by the [place] FDA and has been authorized for detection and/or diagnosis of SARS-CoV-2 by FDA under an Emergency Use Authorization (EUA). This EUA will remain in effect (meaning this test can be used) for the duration of the COVID-19 declaration under Section 564(b)(1) of the Act, 21 U.S.C. section 360bbb-3(b)(1), unless the authorization is terminated or revoked.  Performed at [facility], [address] [place], [place] [address]   MRSA PCR Screening     Status: None   Collection Time: 11/09/20 11:16 PM   Specimen: Nasal Mucosa; Nasopharyngeal  Result Value Ref Range Status   MRSA by PCR NEGATIVE NEGATIVE Final    Comment:        The GeneXpert MRSA Assay (FDA approved for NASAL specimens only), is one component of a comprehensive MRSA colonization surveillance program. It is not intended to diagnose MRSA infection nor to guide or monitor treatment for MRSA infections. Performed at [facility], [address] [place], [place] [address]     Procedures and diagnostic studies:  No results found.  Medications:   . benztropine  1 mg Oral QHS  . heparin  5,000 Units Subcutaneous Q8H   Continuous Infusions: . ceFEPime (MAXIPIME) IV 2 g (11/11/20 0122)     LOS: 2 days   Joseph Art  [facility]   How to contact the [facility] Attending or Consulting provider 7A - 7P or covering provider during after hours 7P -7A, for this patient?  1. Check the care team in [facility] and look for a) attending/consulting TRH provider listed and b) the [facility] team listed 2. Log into [email or web address] and use [place]'s universal password to access. If you do not have the password, please contact the hospital operator. 3. Locate the [facility] provider you are looking for under [facility]  and page to a number that you can be directly reached. 4. If you still have difficulty reaching the provider, please page the [facility] (Director on Call) for the Hospitalists listed on amion for assistance.  11/11/2020, 10:29 AM

## 2020-11-12 ENCOUNTER — Inpatient Hospital Stay (HOSPITAL_COMMUNITY): Payer: Medicare Other

## 2020-11-12 DIAGNOSIS — N390 Urinary tract infection, site not specified: Secondary | ICD-10-CM

## 2020-11-12 MED ORDER — BACLOFEN 10 MG PO TABS
10.0000 mg | ORAL_TABLET | Freq: Once | ORAL | Status: AC
  Administered 2020-11-12: 17:00:00 10 mg via ORAL
  Filled 2020-11-12: qty 1

## 2020-11-12 NOTE — Progress Notes (Addendum)
Progress Note    Ricky Hanson  ACZ:[id number] DOB: 1951-06-28  DOA: 11/08/2020 PCP: Hoy Register, MD    Brief Narrative:     Medical records reviewed and are as summarized below:  Ricky Hanson is an 69 y.o. 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  Found to have AKI from urinary retention due to misplaced foley and possible UTI.  Poor PO intake.  Assessment/Plan:   Principal Problem:   ARF (acute renal failure) (HCC) Active Problems:   Chronic indwelling Foley catheter   Peripheral vascular disease (HCC)   CKD (chronic kidney disease), stage III (HCC)   Essential hypertension   AKI (acute kidney injury) (HCC)   Acute on chronic kidney disease stage III with mild hyperkalemia from obstruction due to misplaced Foley catheter  -CT renal study showed patient's Foley catheters balloon was blown in the urethra.   -on-call urologist Dr. Hart Robinsons: advised to deflate the balloon and put the Foley in and then reinflated the balloon- urine output immediately of 1800 cc.   -strict I/Os: good UOP -Cr trending down  UTI - providencia -cefepime -narrow to PO once able to take PO  Acute encephalopathy likely from metabolic reasons and UTI.  Closely monitor. -unclear what patient's baseline is  History of schizophrenia -appears to get haldol q 28 days -cogentin QHS  Hypertension -hold off patient's lisinopril  -hold norvasc   Chronic anemia -trend CBC  History of peripheral vascular disease   Continues with poor PO intake.  Denies nausea, abdominal pain, SOB.   Hiccups-- will check RUQ U/S  Family Communication/Anticipated D/C date and plan/Code Status   DVT prophylaxis: heparin Code Status: Full Code.  Disposition  Plan: inpt  The patient will require care spanning > 2 midnights and should be inpatient because: IV treatments appropriate due to intensity of illness or inability to take PO  Dispo: The patient is from: SNF              Anticipated d/c is to: SNF              Anticipated d/c date is: 1-2 days              Patient currently is not medically stable to d/c.- improved PO intake, will get BMP in AM if CR stable can d/c back to SNF         Medical Consultants:    Urology (phone)     Subjective:   Says he ate better this morning  Objective:    Vitals:   11/11/20 2047 11/12/20 0234 11/12/20 0519 11/12/20 0900  BP: 119/67  134/73 127/66  Pulse: 91  73 81  Resp: 16  18 16   Temp: 98.4 F (36.9 C)  99.2 F (37.3 C) 99.6 F (37.6 C)  TempSrc: Oral  Oral Oral  SpO2: 96%  97% 99%  Weight:  76.9 kg    Height:        Intake/Output Summary (Last 24 hours) at 11/12/2020 1155 Last data filed at 11/12/2020 0300 Gross per 24 hour  Intake 920 ml  Output 1200 ml  Net -280 ml   Filed Weights   11/10/20 0020 11/11/20 1100 11/12/20 0234  Weight: 78 kg 77 kg 76.9 kg    Exam:   General: Appearance:  male      Lungs:     Clear to auscultation bilaterally, respirations unlabored  Heart:    Normal heart rate. Normal rhythm. No murmurs, rubs, or gallops.      Neurologic:   Awake, alert, oriented x 3. withdrawn      Data Reviewed:   I have personally reviewed following labs and imaging studies:  Labs: Labs show the following:   Basic Metabolic Panel: Recent Labs  Lab 11/08/20 1640 11/08/20 2139 11/09/20 0500 11/10/20 0319 11/11/20 0342  NA 135  --  139 136 138  K 5.2*  --  5.2* 4.6 4.4  CL 104  --  110 109 109  CO2 16*  --  19* 19* 20*  GLUCOSE 150*  --  135* 153* 160*  BUN 74*  --  57* 31* 19  CREATININE 3.03* 2.60* 2.22* 1.46* 1.35*  CALCIUM 8.9  --  8.7* 8.4* 8.7*   GFR Estimated Creatinine Clearance: 57.8 mL/min (A) (by C-G formula based on SCr of  1.35 mg/dL (H)). Liver Function Tests: Recent Labs  Lab 11/08/20 1640 11/09/20 0500  AST 16 12*  ALT 23 18  ALKPHOS 149* 132*  BILITOT 1.0 0.6  PROT 7.0 6.4*  ALBUMIN 2.6* 2.4*   Recent Labs  Lab 11/08/20 1640  LIPASE 36   Recent Labs  Lab 11/08/20 1640  AMMONIA 25   Coagulation profile No results for input(s): INR, PROTIME in the last 168 hours.  CBC: Recent Labs  Lab 11/08/20 1640 11/08/20 2139 11/09/20 0500 11/10/20 0319 11/11/20 0342  WBC 19.4* 18.3* 18.2* 14.8* 14.4*  NEUTROABS 15.7*  --  14.6*  --   --   HGB 9.3* 8.3* 8.9* 7.9* 7.9*  HCT 29.4* 25.6* 27.3* 22.8* 24.2*  MCV 81.4 79.0* 80.1 76.5* 78.6*  PLT 528* 499* 545* 506* 468*   Cardiac Enzymes: No results for input(s): CKTOTAL, CKMB, CKMBINDEX, TROPONINI in the last 168 hours. BNP (last 3 results) No results for input(s): PROBNP in the last 8760 hours. CBG: Recent Labs  Lab 11/08/20 1622 11/10/20 1209  GLUCAP 143* 164*   D-Dimer: No results for input(s): DDIMER in the last 72 hours. Hgb A1c: No results for input(s): HGBA1C in the last 72 hours. Lipid Profile: No results for input(s): CHOL, HDL, LDLCALC, TRIG, CHOLHDL, LDLDIRECT in the last 72 hours. Thyroid function studies: No results for input(s): TSH, T4TOTAL, T3FREE, THYROIDAB in the last 72 hours.  Invalid input(s): FREET3 Anemia work up: Recent Labs    11/10/20 0319  FERRITIN 688*  TIBC 174*  IRON 42*   Sepsis Labs: Recent Labs  Lab 11/08/20 1640 11/08/20 2139 11/09/20 0500 11/10/20 0319 11/11/20 0342  WBC 19.4* 18.3* 18.2* 14.8* 14.4*  LATICACIDVEN 1.0  --   --   --   --     Microbiology Recent Results (from the past 240 hour(s))  Culture, blood (routine x 2)     Status: None (Preliminary result)   Collection Time: 11/08/20  4:01 PM   Specimen: BLOOD  Result Value Ref Range Status   Specimen Description BLOOD RIGHT SHOULDER  Final   Special Requests   Final    BOTTLES DRAWN AEROBIC AND ANAEROBIC Blood Culture  results may not be optimal due to an inadequate volume of blood received in culture bottles   Culture   Final    NO GROWTH 4 DAYS Performed at [facility], [address], [place], [place] [address]    Report Status PENDING  Incomplete  Culture, blood (routine x 2)     Status: None (Preliminary result)   Collection Time: 11/08/20  4:37 PM   Specimen: BLOOD RIGHT ARM  Result Value Ref Range Status   Specimen Description BLOOD RIGHT ARM  Final   Special Requests   Final    BOTTLES DRAWN AEROBIC AND ANAEROBIC Blood Culture results may not be optimal due to an inadequate volume of blood received in culture bottles   Culture   Final    NO GROWTH 4 DAYS Performed at [facility], [address] [place], [place] [address]    Report Status PENDING  Incomplete  Urine culture     Status: Abnormal   Collection Time: 11/08/20  5:46 PM   Specimen: Urine, Random  Result Value Ref Range Status   Specimen Description URINE, RANDOM  Final   Special Requests NONE  Final   Culture (A)  Final    80,000 COLONIES/mL PROVIDENCIA STUARTII 40,000 COLONIES/mL GROUP B STREP(S.AGALACTIAE)ISOLATED TESTING AGAINST S. AGALACTIAE NOT ROUTINELY PERFORMED DUE TO PREDICTABILITY OF AMP/PEN/VAN SUSCEPTIBILITY. Performed at [facility] [place] [facility], [address] [place], [place] [address]    Report Status 11/11/2020 FINAL  Final   Organism ID, Bacteria PROVIDENCIA STUARTII (A)  Final      Susceptibility   Providencia stuartii - MIC*    AMPICILLIN RESISTANT Resistant     CEFAZOLIN >=64 RESISTANT Resistant     CEFEPIME <=0.12 SENSITIVE Sensitive     CEFTRIAXONE <=0.25 SENSITIVE Sensitive     CIPROFLOXACIN <=0.25 SENSITIVE Sensitive     GENTAMICIN >=16 RESISTANT Resistant     IMIPENEM 1 SENSITIVE Sensitive     NITROFURANTOIN 256 RESISTANT Resistant     TRIMETH/SULFA <=20 SENSITIVE Sensitive     AMPICILLIN/SULBACTAM 8 SENSITIVE Sensitive     PIP/TAZO <=4 SENSITIVE Sensitive     * 80,000 COLONIES/mL  PROVIDENCIA STUARTII  Resp Panel by RT-PCR (Flu A&B, Covid) Nasopharyngeal Swab     Status: None   Collection Time: 11/08/20  6:20 PM   Specimen: Nasopharyngeal Swab; Nasopharyngeal(NP) swabs in vial transport medium  Result Value Ref Range Status   SARS Coronavirus 2 by RT PCR NEGATIVE NEGATIVE Final    Comment: (NOTE) SARS-CoV-2 target nucleic acids are NOT DETECTED.  The SARS-CoV-2 RNA is generally detectable in upper respiratory specimens during the acute phase of infection. The lowest concentration of SARS-CoV-2 viral copies this assay can detect is 138 copies/mL. A negative result does not preclude SARS-Cov-2 infection and should not be used as the sole basis for treatment or other patient management decisions. A negative result may occur with  improper specimen collection/handling, submission of specimen other than nasopharyngeal swab, presence of viral mutation(s) within the areas targeted by this assay, and inadequate number of viral copies(<138 copies/mL). A negative result must be combined with clinical observations, patient history, and epidemiological information. The expected result is Negative.  Fact Sheet for Patients:  BloggerCourse.comhttps://www.fda.gov/media/152166/download  Fact Sheet for Healthcare Providers:  SeriousBroker.ithttps://www.fda.gov/media/152162/download  This test is no t yet approved or cleared by the [place] FDA and  has been authorized for detection and/or diagnosis of SARS-CoV-2 by FDA under an Emergency Use Authorization (EUA). This EUA will remain  in effect (meaning this test can be used) for the duration of the COVID-19 declaration under Section 564(b)(1) of the Act, 21 U.S.C.section 360bbb-3(b)(1), unless the authorization is terminated  or revoked sooner.       Influenza A by PCR NEGATIVE NEGATIVE Final   Influenza B  by PCR NEGATIVE NEGATIVE Final    Comment: (NOTE) The Xpert Xpress SARS-CoV-2/FLU/RSV plus assay is intended as an aid in the diagnosis of  influenza from Nasopharyngeal swab specimens and should not be used as a sole basis for treatment. Nasal washings and aspirates are unacceptable for Xpert Xpress SARS-CoV-2/FLU/RSV testing.  Fact Sheet for Patients: BloggerCourse.com  Fact Sheet for Healthcare Providers: SeriousBroker.it  This test is not yet approved or cleared by the [place] FDA and has been authorized for detection and/or diagnosis of SARS-CoV-2 by FDA under an Emergency Use Authorization (EUA). This EUA will remain in effect (meaning this test can be used) for the duration of the COVID-19 declaration under Section 564(b)(1) of the Act, 21 U.S.C. section 360bbb-3(b)(1), unless the authorization is terminated or revoked.  Performed at [facility], [address], [place], [place] [address]   MRSA PCR Screening     Status: None   Collection Time: 11/09/20 11:16 PM   Specimen: Nasal Mucosa; Nasopharyngeal  Result Value Ref Range Status   MRSA by PCR NEGATIVE NEGATIVE Final    Comment:        The GeneXpert MRSA Assay (FDA approved for NASAL specimens only), is one component of a comprehensive MRSA colonization surveillance program. It is not intended to diagnose MRSA infection nor to guide or monitor treatment for MRSA infections. Performed at [facility], [address], [place], [place] [address]     Procedures and diagnostic studies:  No results found.  Medications:   . benztropine  1 mg Oral QHS  . Chlorhexidine Gluconate Cloth  6 each Topical Daily  . heparin  5,000 Units Subcutaneous Q8H   Continuous Infusions: . ceFEPime (MAXIPIME) IV 2 g (11/12/20 0947)     LOS: 3 days   Joseph Art  [facility]   How to contact the [facility] Attending or Consulting provider 7A - 7P or covering provider during after hours 7P -7A, for this patient?  1. Check the care team in [facility] and look for a) attending/consulting TRH provider  listed and b) the [facility] team listed 2. Log into [email or web address] and use [place]'s universal password to access. If you do not have the password, please contact the hospital operator. 3. Locate the [facility] provider you are looking for under [facility] and page to a number that you can be directly reached. 4. If you still have difficulty reaching the provider, please page the [facility] (Director on Call) for the Hospitalists listed on amion for assistance.  11/12/2020, 11:55 AM

## 2020-11-12 NOTE — Plan of Care (Signed)
Patient has been NPO since 1500 today due abdominal ultrasound due to RUQ pain. Patient continues to have poor nutrition. Foley intact draining dark color urine.  Problem: Education: Goal: Knowledge of disease and its progression will improve Outcome: Progressing   Problem: Health Behavior/Discharge Planning: Goal: Ability to manage health-related needs will improve Outcome: Progressing   Problem: Clinical Measurements: Goal: Complications related to the disease process or treatment will be avoided or minimized Outcome: Progressing Goal: Dialysis access will remain free of complications Outcome: Progressing   Problem: Activity: Goal: Activity intolerance will improve Outcome: Progressing   Problem: Fluid Volume: Goal: Fluid volume balance will be maintained or improved Outcome: Progressing   Problem: Respiratory: Goal: Respiratory symptoms related to disease process will be avoided Outcome: Progressing   Problem: Urinary Elimination: Goal: Progression of disease will be identified and treated Outcome: Progressing   Problem: Education: Goal: Ability to demonstrate management of disease process will improve Outcome: Progressing Goal: Ability to verbalize understanding of medication therapies will improve Outcome: Progressing Goal: Individualized Educational Video(s) Outcome: Progressing   Problem: Activity: Goal: Capacity to carry out activities will improve Outcome: Progressing   Problem: Cardiac: Goal: Ability to achieve and maintain adequate cardiopulmonary perfusion will improve Outcome: Progressing   Problem: Education: Goal: Knowledge of General Education information will improve Description: Including pain rating scale, medication(s)/side effects and non-pharmacologic comfort measures Outcome: Progressing   Problem: Health Behavior/Discharge Planning: Goal: Ability to manage health-related needs will improve Outcome: Progressing   Problem: Clinical  Measurements: Goal: Ability to maintain clinical measurements within normal limits will improve Outcome: Progressing Goal: Will remain free from infection Outcome: Progressing Goal: Diagnostic test results will improve Outcome: Progressing Goal: Respiratory complications will improve Outcome: Progressing Goal: Cardiovascular complication will be avoided Outcome: Progressing   Problem: Activity: Goal: Risk for activity intolerance will decrease Outcome: Progressing   Problem: Nutrition: Goal: Adequate nutrition will be maintained Outcome: Progressing   Problem: Coping: Goal: Level of anxiety will decrease Outcome: Progressing   Problem: Elimination: Goal: Will not experience complications related to bowel motility Outcome: Progressing Goal: Will not experience complications related to urinary retention Outcome: Progressing   Problem: Pain Managment: Goal: General experience of comfort will improve Outcome: Progressing   Problem: Safety: Goal: Ability to remain free from injury will improve Outcome: Progressing   Problem: Skin Integrity: Goal: Risk for impaired skin integrity will decrease Outcome: Progressing

## 2020-11-13 LAB — CULTURE, BLOOD (ROUTINE X 2)
Culture: NO GROWTH
Culture: NO GROWTH

## 2020-11-13 LAB — COMPREHENSIVE METABOLIC PANEL
ALT: 13 U/L (ref 0–44)
AST: 12 U/L — ABNORMAL LOW (ref 15–41)
Albumin: 2.3 g/dL — ABNORMAL LOW (ref 3.5–5.0)
Alkaline Phosphatase: 84 U/L (ref 38–126)
Anion gap: 9 (ref 5–15)
BUN: 21 mg/dL (ref 8–23)
CO2: 21 mmol/L — ABNORMAL LOW (ref 22–32)
Calcium: 8.9 mg/dL (ref 8.9–10.3)
Chloride: 108 mmol/L (ref 98–111)
Creatinine, Ser: 1.69 mg/dL — ABNORMAL HIGH (ref 0.61–1.24)
GFR, Estimated: 44 mL/min — ABNORMAL LOW (ref >60)
Glucose, Bld: 120 mg/dL — ABNORMAL HIGH (ref 70–99)
Potassium: 4.3 mmol/L (ref 3.5–5.1)
Sodium: 138 mmol/L (ref 135–145)
Total Bilirubin: 0.7 mg/dL (ref 0.3–1.2)
Total Protein: 6.5 g/dL (ref 6.5–8.1)

## 2020-11-13 LAB — CBC
HCT: 25.6 % — ABNORMAL LOW (ref 39.0–52.0)
Hemoglobin: 8 g/dL — ABNORMAL LOW (ref 13.0–17.0)
MCH: 25.6 pg — ABNORMAL LOW (ref 26.0–34.0)
MCHC: 31.3 g/dL (ref 30.0–36.0)
MCV: 82.1 fL (ref 80.0–100.0)
Platelets: 453 10*3/uL — ABNORMAL HIGH (ref 150–400)
RBC: 3.12 MIL/uL — ABNORMAL LOW (ref 4.22–5.81)
RDW: 15.5 % (ref 11.5–15.5)
WBC: 14.5 10*3/uL — ABNORMAL HIGH (ref 4.0–10.5)
nRBC: 0 % (ref 0.0–0.2)

## 2020-11-13 MED ORDER — SODIUM CHLORIDE 0.9 % IV SOLN: INTRAVENOUS | Status: AC

## 2020-11-13 MED ORDER — MIRTAZAPINE 15 MG PO TBDP
7.5000 mg | ORAL_TABLET | Freq: Every day | ORAL | Status: DC
  Administered 2020-11-13: 22:00:00 7.5 mg via ORAL
  Filled 2020-11-13: qty 0.5

## 2020-11-13 NOTE — Care Management Important Message (Signed)
Important Message  Patient Details  Name: Nation Cradle MRN: [id number] Date of Birth: [birth year]   Medicare Important Message Given:  Yes     Renie Ora 11/13/2020, 11:11 AM

## 2020-11-13 NOTE — TOC Progression Note (Signed)
Transition of Care [facility]) - Progression Note    Patient Details  Name: Ricky Hanson MRN: [id number] Date of Birth: 1951/01/28  Transition of Care [facility] CM/SW Contact  Patrik Turnbaugh [place], [place] Phone Number: 11/13/2020, 2:32 PM  Clinical Narrative:     CSW notified pt, pt cousin, and pt brother of likely d/c tomorrow. No concerns or questions expressed. CSW started [place]. Ref# [id number] and clinicals faxed.   Expected Discharge Plan: Skilled Nursing Facility Barriers to Discharge: Continued Medical Work up  Expected Discharge Plan and Services Expected Discharge Plan: Skilled Nursing Facility       Living arrangements for the past 2 months: Skilled Nursing Facility                                       Social Determinants of Health (SDOH) Interventions    Readmission Risk Interventions No flowsheet data found.

## 2020-11-13 NOTE — Progress Notes (Signed)
Orthopedic Tech Progress Note Patient Details:  Ricky Hanson 05-29-1951 [id number] Was called by HANGER to see where patient is placed and when was he being discharged. HANGER is making patient a leg.  Patient ID: Ricky Hanson, male   DOB: [birth year], 69 y.o.   MRN: [id number]   [name] 11/13/2020, 1:45 PM

## 2020-11-13 NOTE — Evaluation (Signed)
Physical Therapy Evaluation Patient Details Name: Ricky Hanson MRN: [id number] DOB: Aug 10, 1951 Today's Date: 11/13/2020   History of Present Illness  Pt is a 69 y.o. M with significant PMH of schizophrenia, HTN, CKD, [name], who presents with AKI stage III with mild hyperkalemia from obstruction due to misplaced Foley catheter.  Clinical Impression  Prior to admission, pt resides at [facility]; unsure of PLOF as pt is an unreliable historian. Pt currently oriented to self only and has noted deficits in awareness and problem solving. Pt requiring min assist for bed mobility and transfers to standing. Unable to sequence pivoting or hopping and pt sat back down on edge of bed. Presents as a high fall risk based on decreased safety awareness and balance deficits. Recommend return to SNF at discharge.     Follow Up Recommendations SNF    Equipment Recommendations  None recommended by PT    Recommendations for Other Services       Precautions / Restrictions Precautions Precautions: Fall Restrictions Weight Bearing Restrictions: No      Mobility  Bed Mobility Overal bed mobility: Needs Assistance Bed Mobility: Supine to Sit;Sit to Supine     Supine to sit: Min guard Sit to supine: Min assist   General bed mobility comments: Min guard for transition to sitting edge of bed. MinA for LE negotiation back into bed. HOB elevated.    Transfers Overall transfer level: Needs assistance Equipment used: Rolling walker (2 wheeled) Transfers: Sit to/from Stand Sit to Stand: Min assist         General transfer comment: MinA to boost to rise to standing position  Ambulation/Gait                Stairs            Wheelchair Mobility    Modified Rankin (Stroke Patients Only)       Balance Overall balance assessment: Needs assistance Sitting-balance support: Feet unsupported Sitting balance-Leahy Scale: Good     Standing balance support: Bilateral upper  extremity supported Standing balance-Leahy Scale: Poor Standing balance comment: reliant on external support                             Pertinent Vitals/Pain Pain Assessment: No/denies pain    Home Living Family/patient expects to be discharged to:: Skilled nursing facility                 Additional Comments: [facility]    Prior Function Level of Independence: Needs assistance         Comments: unsure of exact PLOF; likely requiring assist with mobility and ADL's     Hand Dominance   Dominant Hand: Right    Extremity/Trunk Assessment   Upper Extremity Assessment Upper Extremity Assessment: Overall WFL for tasks assessed    Lower Extremity Assessment Lower Extremity Assessment: RLE deficits/detail;LLE deficits/detail RLE Deficits / Details: BKA. Grossly 3+/5, able to reach neutral knee extension LLE Deficits / Details: Grossly 3+/5    Cervical / Trunk Assessment Cervical / Trunk Assessment: Normal  Communication   Communication: No difficulties  Cognition Arousal/Alertness: Awake/alert Behavior During Therapy: Flat affect Overall Cognitive Status: Impaired/Different from baseline Area of Impairment: Orientation;Following commands;Awareness;Problem solving                 Orientation Level: Disoriented to;Place;Time;Situation     Following Commands: Follows one step commands inconsistently   Awareness: Intellectual Problem Solving: Slow processing;Decreased initiation;Difficulty  sequencing;Requires verbal cues;Requires tactile cues General Comments: Pt oriented to self only; could state he was in "[place]," but not the city or hospital. Pt with delayed processing times, requiring cues for sequencing i.e. when asked to lift his left leg, he would lift his right leg      General Comments      Exercises     Assessment/Plan    PT Assessment Patient needs continued PT services  PT Problem List Decreased  strength;Decreased activity tolerance;Decreased balance;Decreased mobility;Decreased cognition;Decreased safety awareness       PT Treatment Interventions DME instruction;Gait training;Functional mobility training;Therapeutic activities;Therapeutic exercise;Balance training;Patient/family education    PT Goals (Current goals can be found in the Care Plan section)  Acute Rehab PT Goals Patient Stated Goal: did not state PT Goal Formulation: Patient unable to participate in goal setting Time For Goal Achievement: 11/27/20 Potential to Achieve Goals: Fair    Frequency Min 2X/week   Barriers to discharge        Co-evaluation               AM-PAC PT "6 Clicks" Mobility  Outcome Measure Help needed turning from your back to your side while in a flat bed without using bedrails?: A Little Help needed moving from lying on your back to sitting on the side of a flat bed without using bedrails?: A Little Help needed moving to and from a bed to a chair (including a wheelchair)?: A Lot Help needed standing up from a chair using your arms (e.g., wheelchair or bedside chair)?: A Little Help needed to walk in hospital room?: A Lot Help needed climbing 3-5 steps with a railing? : Total 6 Click Score: 14    End of Session Equipment Utilized During Treatment: Gait belt Activity Tolerance: Patient tolerated treatment well Patient left: in bed;with call bell/phone within reach;with bed alarm set Nurse Communication: Mobility status PT Visit Diagnosis: Difficulty in walking, not elsewhere classified (R26.2)    Time: 4782-9562 PT Time Calculation (min) (ACUTE ONLY): 16 min   Charges:   PT Evaluation $PT Eval Moderate Complexity: 1 Mod          Lillia Pauls, PT, DPT Acute Rehabilitation Services Pager [phone] Office [phone]   Norval Morton 11/13/2020, 1:42 PM

## 2020-11-13 NOTE — Progress Notes (Signed)
Patients urine output milky white cloudy, foley catheter leaking, foley care done. Primary RN made aware.

## 2020-11-13 NOTE — Progress Notes (Signed)
Pharmacy Antibiotic Note  Ricky Hanson is a 69 y.o. male admitted on 11/08/2020 with UTI.  Pharmacy has been consulted for Cefepime dosing.  ID: UTI - chronic foley, Tmax 99.6, WBC 14.5 same. Scr up to 1.69  Cefepime 12/15>>  12/15 UCx: PROVIDENCIA STUARTII, GROUP B STREP 12/15 BCx: neg  Plan: Con't Cefepime 2g q 12h    Height: 6\' 1"  (185.4 cm) Weight: 76.3 kg (168 lb 3.4 oz) IBW/kg (Calculated) : 79.9  Temp (24hrs), Avg:98.8 F (37.1 C), Min:98.6 F (37 C), Max:98.9 F (37.2 C)  Recent Labs  Lab 11/08/20 1640 11/08/20 2139 11/09/20 0500 11/10/20 0319 11/11/20 0342 11/13/20 0419  WBC 19.4* 18.3* 18.2* 14.8* 14.4* 14.5*  CREATININE 3.03* 2.60* 2.22* 1.46* 1.35* 1.69*  LATICACIDVEN 1.0  --   --   --   --   --     Estimated Creatinine Clearance: 45.8 mL/min (A) (by C-G formula based on SCr of 1.69 mg/dL (H)).    No Known Allergies   Ricky Vitale S. 11/15/20, PharmD, BCPS Clinical Staff Pharmacist [email or web address] Ricky Hanson 11/13/2020 10:56 AM

## 2020-11-13 NOTE — Progress Notes (Signed)
Progress Note    Ricky Hanson  HYQ:[id number] DOB: 31-Jan-1951  DOA: 11/08/2020 PCP: Hoy Register, MD    Brief Narrative:     Medical records reviewed and are as summarized below:  Ricky Hanson is an 69 y.o. 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  Found to have AKI from urinary retention due to misplaced foley and possible UTI.  Poor PO intake.  Assessment/Plan:   Principal Problem:   ARF (acute renal failure) (HCC) Active Problems:   Chronic indwelling Foley catheter   Peripheral vascular disease (HCC)   CKD (chronic kidney disease), stage III (HCC)   Essential hypertension   AKI (acute kidney injury) (HCC)   Acute on chronic kidney disease stage III with mild hyperkalemia from obstruction due to misplaced Foley catheter  -CT renal study showed patient's Foley catheters balloon was blown in the urethra.   -on-call urologist Dr. Hart Robinsons: advised to deflate the balloon and put the Foley in and then reinflated the balloon- urine output immediately of 1800 cc.   -strict I/Os: good UOP -Cr trending down while on IVF but within 24 hours of stoppage CR trends back up as patient not eating/drinking  UTI - providencia -cefepime -narrow to PO once able to take PO  Acute encephalopathy likely from metabolic reasons and UTI.  Closely monitor. -unclear what patient's baseline is -suspect he is severely depressed  History of schizophrenia -appears to get haldol q 28 days -cogentin QHS  Hypertension -hold off patient's lisinopril  -hold norvasc   Chronic anemia -stable  History of peripheral vascular disease   Continues with poor PO intake.  Denies nausea, abdominal pain, SOB.   -suspect related to depression: will  start remeron-- will need to be titrated -may need additional agent  Family Communication/Anticipated D/C date and plan/Code Status   DVT prophylaxis: heparin Code Status: Full Code.  Disposition Plan: inpt  The patient will require care spanning > 2 midnights and should be inpatient because: IV treatments appropriate due to intensity of illness or inability to take PO  Dispo: The patient is from: SNF              Anticipated d/c is to: SNF              Anticipated d/c date is: 1-2 days              Patient currently is not medically stable to d/c. ability to stay hydrated         Medical Consultants:    Urology (phone)     Subjective:   When asked if he felt that he was depressed, patient made eye contact and said "yes"  Objective:    Vitals:   11/12/20 0519 11/12/20 0900 11/12/20 1923 11/13/20 0500  BP: 134/73 127/66 115/68 125/66  Pulse: 73 81 81 71  Resp: 18 16 18 15   Temp: 99.2 F (37.3 C) 99.6 F (37.6 C) 98.9 F (37.2 C) 98.6 F (37 C)  TempSrc: Oral Oral Oral Oral  SpO2: 97% 99% 98% 98%  Weight:    76.3 kg  Height:        Intake/Output Summary (Last 24 hours) at 11/13/2020 1149 Last data filed at 11/13/2020 0900 Gross per 24 hour  Intake 960.28 ml  Output 1500  ml  Net -539.72 ml   Filed Weights   11/11/20 1100 11/12/20 0234 11/13/20 0500  Weight: 77 kg 76.9 kg 76.3 kg    Exam:   General: Appearance:    withdrawn male in no acute distress     Lungs:     Clear to auscultation bilaterally, respirations unlabored  Heart:    Normal heart rate. Normal rhythm. No murmurs, rubs, or gallops.      Neurologic:   Awake, alert, poor eye contact      Data Reviewed:   I have personally reviewed following labs and imaging studies:  Labs: Labs show the following:   Basic Metabolic Panel: Recent Labs  Lab 11/08/20 1640 11/08/20 2139 11/09/20 0500 11/10/20 0319 11/11/20 0342 11/13/20 0419  NA 135  --  139 136 138 138  K 5.2*  --   5.2* 4.6 4.4 4.3  CL 104  --  110 109 109 108  CO2 16*  --  19* 19* 20* 21*  GLUCOSE 150*  --  135* 153* 160* 120*  BUN 74*  --  57* 31* 19 21  CREATININE 3.03* 2.60* 2.22* 1.46* 1.35* 1.69*  CALCIUM 8.9  --  8.7* 8.4* 8.7* 8.9   GFR Estimated Creatinine Clearance: 45.8 mL/min (A) (by C-G formula based on SCr of 1.69 mg/dL (H)). Liver Function Tests: Recent Labs  Lab 11/08/20 1640 11/09/20 0500 11/13/20 0419  AST 16 12* 12*  ALT ALKPHOS 149* 132* 84  BILITOT 1.0 0.6 0.7  PROT 7.0 6.4* 6.5  ALBUMIN 2.6* 2.4* 2.3*   Recent Labs  Lab 11/08/20 1640  LIPASE 36   Recent Labs  Lab 11/08/20 1640  AMMONIA 25   Coagulation profile No results for input(s): INR, PROTIME in the last 168 hours.  CBC: Recent Labs  Lab 11/08/20 1640 11/08/20 2139 11/09/20 0500 11/10/20 0319 11/11/20 0342 11/13/20 0419  WBC 19.4* 18.3* 18.2* 14.8* 14.4* 14.5*  NEUTROABS 15.7*  --  14.6*  --   --   --   HGB 9.3* 8.3* 8.9* 7.9* 7.9* 8.0*  HCT 29.4* 25.6* 27.3* 22.8* 24.2* 25.6*  MCV 81.4 79.0* 80.1 76.5* 78.6* 82.1  PLT 528* 499* 545* 506* 468* 453*   Cardiac Enzymes: No results for input(s): CKTOTAL, CKMB, CKMBINDEX, TROPONINI in the last 168 hours. BNP (last 3 results) No results for input(s): PROBNP in the last 8760 hours. CBG: Recent Labs  Lab 11/08/20 1622 11/10/20 1209  GLUCAP 143* 164*   D-Dimer: No results for input(s): DDIMER in the last 72 hours. Hgb A1c: No results for input(s): HGBA1C in the last 72 hours. Lipid Profile: No results for input(s): CHOL, HDL, LDLCALC, TRIG, CHOLHDL, LDLDIRECT in the last 72 hours. Thyroid function studies: No results for input(s): TSH, T4TOTAL, T3FREE, THYROIDAB in the last 72 hours.  Invalid input(s): FREET3 Anemia work up: No results for input(s): VITAMINB12, FOLATE, FERRITIN, TIBC, IRON, RETICCTPCT in the last 72 hours. Sepsis Labs: Recent Labs  Lab 11/08/20 1640 11/08/20 2139 11/09/20 0500 11/10/20 0319  11/11/20 0342 11/13/20 0419  WBC 19.4*   < > 18.2* 14.8* 14.4* 14.5*  LATICACIDVEN 1.0  --   --   --   --   --    < > = values in this interval not displayed.    Microbiology Recent Results (from the past 240 hour(s))  Culture, blood (routine x 2)     Status: None   Collection Time: 11/08/20  4:01 PM   Specimen: BLOOD  Result Value Ref Range Status   Specimen Description BLOOD RIGHT SHOULDER  Final   Special Requests   Final    BOTTLES DRAWN AEROBIC AND ANAEROBIC Blood Culture results may not be optimal due to an inadequate volume of blood received in culture bottles   Culture   Final    NO GROWTH 5 DAYS Performed at [facility], [address], [place], [place] [address]    Report Status 11/13/2020 FINAL  Final  Culture, blood (routine x 2)     Status: None   Collection Time: 11/08/20  4:37 PM   Specimen: BLOOD RIGHT ARM  Result Value Ref Range Status   Specimen Description BLOOD RIGHT ARM  Final   Special Requests   Final    BOTTLES DRAWN AEROBIC AND ANAEROBIC Blood Culture results may not be optimal due to an inadequate volume of blood received in culture bottles   Culture   Final    NO GROWTH 5 DAYS Performed at [facility], [address] [place], [place] [address]    Report Status 11/13/2020 FINAL  Final  Urine culture     Status: Abnormal   Collection Time: 11/08/20  5:46 PM   Specimen: Urine, Random  Result Value Ref Range Status   Specimen Description URINE, RANDOM  Final   Special Requests NONE  Final   Culture (A)  Final    80,000 COLONIES/mL PROVIDENCIA STUARTII 40,000 COLONIES/mL GROUP B STREP(S.AGALACTIAE)ISOLATED TESTING AGAINST S. AGALACTIAE NOT ROUTINELY PERFORMED DUE TO PREDICTABILITY OF AMP/PEN/VAN SUSCEPTIBILITY. Performed at [facility], [address], [place], [place] [address]    Report Status 11/11/2020 FINAL  Final   Organism ID, Bacteria PROVIDENCIA STUARTII (A)  Final      Susceptibility   Providencia stuartii - MIC*     AMPICILLIN RESISTANT Resistant     CEFAZOLIN >=64 RESISTANT Resistant     CEFEPIME <=0.12 SENSITIVE Sensitive     CEFTRIAXONE <=0.25 SENSITIVE Sensitive     CIPROFLOXACIN <=0.25 SENSITIVE Sensitive     GENTAMICIN >=16 RESISTANT Resistant     IMIPENEM 1 SENSITIVE Sensitive     NITROFURANTOIN 256 RESISTANT Resistant     TRIMETH/SULFA <=20 SENSITIVE Sensitive     AMPICILLIN/SULBACTAM 8 SENSITIVE Sensitive     PIP/TAZO <=4 SENSITIVE Sensitive     * 80,000 COLONIES/mL PROVIDENCIA STUARTII  Resp Panel by RT-PCR (Flu A&B, Covid) Nasopharyngeal Swab     Status: None   Collection Time: 11/08/20  6:20 PM   Specimen: Nasopharyngeal Swab; Nasopharyngeal(NP) swabs in vial transport medium  Result Value Ref Range Status   SARS Coronavirus 2 by RT PCR NEGATIVE NEGATIVE Final    Comment: (NOTE) SARS-CoV-2 target nucleic acids are NOT DETECTED.  The SARS-CoV-2 RNA is generally detectable in upper respiratory specimens during the acute phase of infection. The lowest concentration of SARS-CoV-2 viral copies this assay can detect is 138 copies/mL. A negative result does not preclude SARS-Cov-2 infection and should not be used as the sole basis for treatment or other patient management decisions. A negative result may occur with  improper specimen collection/handling, submission of specimen other than nasopharyngeal swab, presence of viral mutation(s) within the areas targeted by this assay, and inadequate number of viral copies(<138 copies/mL). A negative result must be combined with clinical observations, patient history, and epidemiological information. The expected result is Negative.  Fact Sheet for Patients:  BloggerCourse.com  Fact Sheet for Healthcare Providers:  SeriousBroker.it  This test is no t yet  approved or cleared by the [place] and  has been authorized for detection and/or diagnosis of SARS-CoV-2 by FDA under an  Emergency Use Authorization (EUA). This EUA will remain  in effect (meaning this test can be used) for the duration of the COVID-19 declaration under Section 564(b)(1) of the Act, 21 U.S.C.section 360bbb-3(b)(1), unless the authorization is terminated  or revoked sooner.       Influenza A by PCR NEGATIVE NEGATIVE Final   Influenza B by PCR NEGATIVE NEGATIVE Final    Comment: (NOTE) The Xpert Xpress SARS-CoV-2/FLU/RSV plus assay is intended as an aid in the diagnosis of influenza from Nasopharyngeal swab specimens and should not be used as a sole basis for treatment. Nasal washings and aspirates are unacceptable for Xpert Xpress SARS-CoV-2/FLU/RSV testing.  Fact Sheet for Patients: BloggerCourse.com  Fact Sheet for Healthcare Providers: SeriousBroker.it  This test is not yet approved or cleared by the [place] FDA and has been authorized for detection and/or diagnosis of SARS-CoV-2 by FDA under an Emergency Use Authorization (EUA). This EUA will remain in effect (meaning this test can be used) for the duration of the COVID-19 declaration under Section 564(b)(1) of the Act, 21 U.S.C. section 360bbb-3(b)(1), unless the authorization is terminated or revoked.  Performed at [facility], [address] [place], [place] [address]   MRSA PCR Screening     Status: None   Collection Time: 11/09/20 11:16 PM   Specimen: Nasal Mucosa; Nasopharyngeal  Result Value Ref Range Status   MRSA by PCR NEGATIVE NEGATIVE Final    Comment:        The GeneXpert MRSA Assay (FDA approved for NASAL specimens only), is one component of a comprehensive MRSA colonization surveillance program. It is not intended to diagnose MRSA infection nor to guide or monitor treatment for MRSA infections. Performed at [facility], [address] [place], [place] [address]     Procedures and diagnostic studies:  US Abdomen Limited RUQ  (LIVER/GB)  Result Date: 11/12/2020 CLINICAL DATA:  Right upper quadrant pain. EXAM: ULTRASOUND ABDOMEN LIMITED RIGHT UPPER QUADRANT COMPARISON:  None. FINDINGS: Gallbladder: Numerous small shadowing echogenic gallstones are seen within the gallbladder lumen. The largest measures approximately 1.3 cm. There is no evidence of gallbladder wall thickening (1.7 mm). No sonographic Murphy sign noted by sonographer. Common bile duct: Diameter: 4.0 mm Liver: No focal lesion identified. Within normal limits in parenchymal echogenicity. Portal vein is patent on color Doppler imaging with normal direction of blood flow towards the liver. Other: It should be noted that the study is limited secondary to limited ability of the patient to cooperate during the exam. IMPRESSION: Cholelithiasis without evidence of acute cholecystitis. Electronically Signed   By: Aram Candela M.D.   On: 11/12/2020 22:19    Medications:   . benztropine  1 mg Oral QHS  . Chlorhexidine Gluconate Cloth  6 each Topical Daily  . heparin  5,000 Units Subcutaneous Q8H  . mirtazapine  7.5 mg Oral QHS   Continuous Infusions: . sodium chloride 100 mL/hr at 11/13/20 1027  . ceFEPime (MAXIPIME) IV 2 g (11/13/20 0841)     LOS: 4 days   Joseph Art  [facility]   How to contact the [facility] Attending or Consulting provider 7A - 7P or covering provider during after hours 7P -7A, for this patient?  1. Check the care team in [facility] and look for a) attending/consulting [facility] provider listed and b) the [facility] team listed  2. Log into [email or web address] and use [place]'s universal password to access. If you do not have the password, please contact the hospital operator. 3. Locate the [facility] provider you are looking for under [facility] and page to a number that you can be directly reached. 4. If you still have difficulty reaching the provider, please page the [facility] ([facility] Call) for the Hospitalists listed on amion for  assistance.  11/13/2020, 11:49 AM

## 2020-11-14 ENCOUNTER — Inpatient Hospital Stay (HOSPITAL_COMMUNITY): Payer: Medicare Other | Admitting: Anesthesiology

## 2020-11-14 LAB — BASIC METABOLIC PANEL
Anion gap: 11 (ref 5–15)
BUN: 18 mg/dL (ref 8–23)
CO2: 18 mmol/L — ABNORMAL LOW (ref 22–32)
Calcium: 9 mg/dL (ref 8.9–10.3)
Chloride: 109 mmol/L (ref 98–111)
Creatinine, Ser: 1.55 mg/dL — ABNORMAL HIGH (ref 0.61–1.24)
GFR, Estimated: 49 mL/min — ABNORMAL LOW (ref >60)
Glucose, Bld: 111 mg/dL — ABNORMAL HIGH (ref 70–99)
Potassium: 4.1 mmol/L (ref 3.5–5.1)
Sodium: 138 mmol/L (ref 135–145)

## 2020-11-14 LAB — SARS CORONAVIRUS 2 BY RT PCR (HOSPITAL ORDER, PERFORMED IN ~~LOC~~ HOSPITAL LAB): SARS Coronavirus 2: NEGATIVE

## 2020-11-14 MED ORDER — SODIUM CHLORIDE 0.9 % IV SOLN: INTRAVENOUS | Status: AC

## 2020-11-14 MED ORDER — MIRTAZAPINE 15 MG PO TBDP
15.0000 mg | ORAL_TABLET | Freq: Every day | ORAL | Status: DC
  Administered 2020-11-14 – 2020-11-15 (×2): 15 mg via ORAL
  Filled 2020-11-14 (×2): qty 1

## 2020-11-14 NOTE — Anesthesia Procedure Notes (Signed)
Procedure Name: Intubation Date/Time: 11/14/2020 11:53 PM Performed by: Duane Boston, MD Pre-anesthesia Checklist: Patient identified, Emergency Drugs available, Suction available and Patient being monitored Preoxygenation: Pre-oxygenation with 100% oxygen Ventilation: Mask ventilation without difficulty Laryngoscope Size: Mac and 3 Grade View: Grade I Tube type: Oral Tube size: 7.5 mm Number of attempts: 1 Placement Confirmation: ETT inserted through vocal cords under direct vision,  CO2 detector and breath sounds checked- equal and bilateral Secured at: 23 cm Tube secured with: Tape Comments: ACLS Code underway, no drugs given.

## 2020-11-14 NOTE — Progress Notes (Signed)
Progress Note    Ricky Hanson  NTI:[id number] DOB: August 08, 1951  DOA: 11/08/2020 PCP: Hoy Register, MD    Brief Narrative:     Medical records reviewed and are as summarized below:  Ricky Hanson is an 69 y.o. 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  Found to have AKI from urinary retention due to misplaced foley and possible UTI.  Poor PO intake.  Assessment/Plan:   Principal Problem:   ARF (acute renal failure) (HCC) Active Problems:   Chronic indwelling Foley catheter   Peripheral vascular disease (HCC)   CKD (chronic kidney disease), stage III (HCC)   Essential hypertension   AKI (acute kidney injury) (HCC)   Acute on chronic kidney disease stage III with mild hyperkalemia from obstruction due to misplaced Foley catheter  -CT renal study showed patient's Foley catheters balloon was blown in the urethra.   -on-call urologist Dr. Hart Robinsons: advised to deflate the balloon and put the Foley in and then reinflated the balloon- urine output immediately of 1800 cc.   -strict I/Os: have asked nursing to document the % of meals eaten -Cr trending down while on IVF but within 24 hours of stoppage CR trends back up as patient not eating/drinking -continue IVF  UTI - providencia -cefepime x 7 days  Acute encephalopathy likely from metabolic reasons and UTI.  Closely monitor. -unclear what patient's baseline is -suspect he is severely depressed  History of schizophrenia -appears to get haldol q 28 days -cogentin QHS  Hypertension -hold off patient's lisinopril  -hold norvasc   Chronic anemia -stable  History of peripheral vascular disease   Continues with poor PO intake.  Denies nausea, abdominal pain, SOB.   -suspect  related to depression: will start remeron-- will need to be titrated -may need additional agent vs GOC conversation   Family Communication/Anticipated D/C date and plan/Code Status   DVT prophylaxis: heparin Code Status: Full Code.  Disposition Plan: inpt  The patient will require care spanning > 2 midnights and should be inpatient because: IV treatments appropriate due to intensity of illness or inability to take PO  Dispo: The patient is from: SNF              Anticipated d/c is to: SNF              Anticipated d/c date is: 1-2 days              Patient currently is not medically stable to d/c. ability to stay hydrated         Medical Consultants:    Urology (phone)     Subjective:   When asked if he felt that he was depressed, patient made eye contact and said "yes"  Objective:    Vitals:   11/13/20 2125 11/14/20 0450 11/14/20 0600 11/14/20 1305  BP: 133/66  140/65 (!) 117/53  Pulse: 71  74 85  Resp: 17  16 18   Temp: 98.7 F (37.1 C)  99 F (37.2 C) 99.1 F (37.3 C)  TempSrc: Oral  Oral Oral  SpO2: 99%  99% 100%  Weight:  77.5 kg    Height:        Intake/Output Summary (Last 24 hours) at 11/14/2020 1357 Last data filed at 11/14/2020 1303 Gross per 24 hour  Intake 1300.06 ml  Output 1250 ml  Net 50.06 ml   Filed Weights   11/12/20 0234 11/13/20 0500 11/14/20 0450  Weight: 76.9 kg 76.3 kg 77.5 kg    Exam:   General: Appearance:    withdrawn male in no acute distress     Lungs:     respirations unlabored  Heart:    Normal heart rate. Normal rhythm. No murmurs, rubs, or gallops.      Neurologic:   Awake, alert, poor eye contact       Data Reviewed:   I have personally reviewed following labs and imaging studies:  Labs: Labs show the following:   Basic Metabolic Panel: Recent Labs  Lab 11/09/20 0500 11/10/20 0319 11/11/20 0342 11/13/20 0419 11/14/20 0803  NA 139 136 138 138 138  K 5.2* 4.6 4.4 4.3 4.1  CL 110 109 109 108  109  CO2 19* 19* 20* 21* 18*  GLUCOSE 135* 153* 160* 120* 111*  BUN 57* 31* 19 21 18   CREATININE 2.22* 1.46* 1.35* 1.69* 1.55*  CALCIUM 8.7* 8.4* 8.7* 8.9 9.0   GFR Estimated Creatinine Clearance: 50.7 mL/min (A) (by C-G formula based on SCr of 1.55 mg/dL (H)). Liver Function Tests: Recent Labs  Lab 11/08/20 1640 11/09/20 0500 11/13/20 0419  AST 16 12* 12*  ALT 23 18 13   ALKPHOS 149* 132* 84  BILITOT 1.0 0.6 0.7  PROT 7.0 6.4* 6.5  ALBUMIN 2.6* 2.4* 2.3*   Recent Labs  Lab 11/08/20 1640  LIPASE 36   Recent Labs  Lab 11/08/20 1640  AMMONIA 25   Coagulation profile No results for input(s): INR, PROTIME in the last 168 hours.  CBC: Recent Labs  Lab 11/08/20 1640 11/08/20 2139 11/09/20 0500 11/10/20 0319 11/11/20 0342 11/13/20 0419  WBC 19.4* 18.3* 18.2* 14.8* 14.4* 14.5*  NEUTROABS 15.7*  --  14.6*  --   --   --   HGB 9.3* 8.3* 8.9* 7.9* 7.9* 8.0*  HCT 29.4* 25.6* 27.3* 22.8* 24.2* 25.6*  MCV 81.4 79.0* 80.1 76.5* 78.6* 82.1  PLT 528* 499* 545* 506* 468* 453*   Cardiac Enzymes: No results for input(s): CKTOTAL, CKMB, CKMBINDEX, TROPONINI in the last 168 hours. BNP (last 3 results) No results for input(s): PROBNP in the last 8760 hours. CBG: Recent Labs  Lab 11/08/20 1622 11/10/20 1209  GLUCAP 143* 164*   D-Dimer: No results for input(s): DDIMER in the last 72 hours. Hgb A1c: No results for input(s): HGBA1C in the last 72 hours. Lipid Profile: No results for input(s): CHOL, HDL, LDLCALC, TRIG, CHOLHDL, LDLDIRECT in the last 72 hours. Thyroid function studies: No results for input(s): TSH, T4TOTAL, T3FREE, THYROIDAB in the last 72 hours.  Invalid input(s): FREET3 Anemia work up: No results for input(s): VITAMINB12, FOLATE, FERRITIN, TIBC, IRON, RETICCTPCT in the last 72 hours. Sepsis Labs: Recent Labs  Lab 11/08/20 1640 11/08/20 2139 11/09/20 0500 11/10/20 0319 11/11/20 0342 11/13/20 0419  WBC 19.4*   < > 18.2* 14.8* 14.4* 14.5*   LATICACIDVEN 1.0  --   --   --   --   --    < > = values in this interval not displayed.    Microbiology Recent Results (from the past 240 hour(s))  Culture, blood (routine x 2)     Status: None   Collection Time: 11/08/20  4:01 PM   Specimen: BLOOD  Result Value Ref Range Status   Specimen Description BLOOD RIGHT SHOULDER  Final   Special Requests  Final    BOTTLES DRAWN AEROBIC AND ANAEROBIC Blood Culture results may not be optimal due to an inadequate volume of blood received in culture bottles   Culture   Final    NO GROWTH 5 DAYS Performed at [facility], [address] [place], [place] [address]    Report Status 11/13/2020 FINAL  Final  Culture, blood (routine x 2)     Status: None   Collection Time: 11/08/20  4:37 PM   Specimen: BLOOD RIGHT ARM  Result Value Ref Range Status   Specimen Description BLOOD RIGHT ARM  Final   Special Requests   Final    BOTTLES DRAWN AEROBIC AND ANAEROBIC Blood Culture results may not be optimal due to an inadequate volume of blood received in culture bottles   Culture   Final    NO GROWTH 5 DAYS Performed at [facility], [address], [place], [place] [address]    Report Status 11/13/2020 FINAL  Final  Urine culture     Status: Abnormal   Collection Time: 11/08/20  5:46 PM   Specimen: Urine, Random  Result Value Ref Range Status   Specimen Description URINE, RANDOM  Final   Special Requests NONE  Final   Culture (A)  Final    80,000 COLONIES/mL PROVIDENCIA STUARTII 40,000 COLONIES/mL GROUP B STREP(S.AGALACTIAE)ISOLATED TESTING AGAINST S. AGALACTIAE NOT ROUTINELY PERFORMED DUE TO PREDICTABILITY OF AMP/PEN/VAN SUSCEPTIBILITY. Performed at [facility], [address], [place], [place] [address]    Report Status 11/11/2020 FINAL  Final   Organism ID, Bacteria PROVIDENCIA STUARTII (A)  Final      Susceptibility   Providencia stuartii - MIC*    AMPICILLIN RESISTANT Resistant     CEFAZOLIN >=64 RESISTANT  Resistant     CEFEPIME <=0.12 SENSITIVE Sensitive     CEFTRIAXONE <=0.25 SENSITIVE Sensitive     CIPROFLOXACIN <=0.25 SENSITIVE Sensitive     GENTAMICIN >=16 RESISTANT Resistant     IMIPENEM 1 SENSITIVE Sensitive     NITROFURANTOIN 256 RESISTANT Resistant     TRIMETH/SULFA <=20 SENSITIVE Sensitive     AMPICILLIN/SULBACTAM 8 SENSITIVE Sensitive     PIP/TAZO <=4 SENSITIVE Sensitive     * 80,000 COLONIES/mL PROVIDENCIA STUARTII  Resp Panel by RT-PCR (Flu A&B, Covid) Nasopharyngeal Swab     Status: None   Collection Time: 11/08/20  6:20 PM   Specimen: Nasopharyngeal Swab; Nasopharyngeal(NP) swabs in vial transport medium  Result Value Ref Range Status   SARS Coronavirus 2 by RT PCR NEGATIVE NEGATIVE Final    Comment: (NOTE) SARS-CoV-2 target nucleic acids are NOT DETECTED.  The SARS-CoV-2 RNA is generally detectable in upper respiratory specimens during the acute phase of infection. The lowest concentration of SARS-CoV-2 viral copies this assay can detect is 138 copies/mL. A negative result does not preclude SARS-Cov-2 infection and should not be used as the sole basis for treatment or other patient management decisions. A negative result may occur with  improper specimen collection/handling, submission of specimen other than nasopharyngeal swab, presence of viral mutation(s) within the areas targeted by this assay, and inadequate number of viral copies(<138 copies/mL). A negative result must be combined with clinical observations, patient history, and epidemiological information. The expected result is Negative.  Fact Sheet for Patients:  BloggerCourse.com  Fact Sheet for Healthcare Providers:  SeriousBroker.it  This test is no t yet approved or cleared by the [place] FDA and  has been authorized for detection and/or diagnosis of SARS-CoV-2 by  FDA under an Emergency Use Authorization (EUA). This EUA will remain  in effect  (meaning this test can be used) for the duration of the COVID-19 declaration under Section 564(b)(1) of the Act, 21 U.S.C.section 360bbb-3(b)(1), unless the authorization is terminated  or revoked sooner.       Influenza A by PCR NEGATIVE NEGATIVE Final   Influenza B by PCR NEGATIVE NEGATIVE Final    Comment: (NOTE) The Xpert Xpress SARS-CoV-2/FLU/RSV plus assay is intended as an aid in the diagnosis of influenza from Nasopharyngeal swab specimens and should not be used as a sole basis for treatment. Nasal washings and aspirates are unacceptable for Xpert Xpress SARS-CoV-2/FLU/RSV testing.  Fact Sheet for Patients: BloggerCourse.comhttps://www.fda.gov/media/152166/download  Fact Sheet for Healthcare Providers: SeriousBroker.ithttps://www.fda.gov/media/152162/download  This test is not yet approved or cleared by the [place] FDA and has been authorized for detection and/or diagnosis of SARS-CoV-2 by FDA under an Emergency Use Authorization (EUA). This EUA will remain in effect (meaning this test can be used) for the duration of the COVID-19 declaration under Section 564(b)(1) of the Act, 21 U.S.C. section 360bbb-3(b)(1), unless the authorization is terminated or revoked.  Performed at [facility], [address] [place], [place] [address]   MRSA PCR Screening     Status: None   Collection Time: 11/09/20 11:16 PM   Specimen: Nasal Mucosa; Nasopharyngeal  Result Value Ref Range Status   MRSA by PCR NEGATIVE NEGATIVE Final    Comment:        The GeneXpert MRSA Assay (FDA approved for NASAL specimens only), is one component of a comprehensive MRSA colonization surveillance program. It is not intended to diagnose MRSA infection nor to guide or monitor treatment for MRSA infections. Performed at [facility], [address] [place], [place] [address]     Procedures and diagnostic studies:  US Abdomen Limited RUQ (LIVER/GB)  Result Date: 11/12/2020 CLINICAL DATA:  Right upper  quadrant pain. EXAM: ULTRASOUND ABDOMEN LIMITED RIGHT UPPER QUADRANT COMPARISON:  None. FINDINGS: Gallbladder: Numerous small shadowing echogenic gallstones are seen within the gallbladder lumen. The largest measures approximately 1.3 cm. There is no evidence of gallbladder wall thickening (1.7 mm). No sonographic Murphy sign noted by sonographer. Common bile duct: Diameter: 4.0 mm Liver: No focal lesion identified. Within normal limits in parenchymal echogenicity. Portal vein is patent on color Doppler imaging with normal direction of blood flow towards the liver. Other: It should be noted that the study is limited secondary to limited ability of the patient to cooperate during the exam. IMPRESSION: Cholelithiasis without evidence of acute cholecystitis. Electronically Signed   By: Aram Candelahaddeus  Houston M.D.   On: 11/12/2020 22:19    Medications:   . benztropine  1 mg Oral QHS  . Chlorhexidine Gluconate Cloth  6 each Topical Daily  . heparin  5,000 Units Subcutaneous Q8H  . mirtazapine  15 mg Oral QHS   Continuous Infusions: . sodium chloride 100 mL/hr at 11/14/20 1338  . ceFEPime (MAXIPIME) IV 2 g (11/14/20 0906)     LOS: 5 days   Joseph ArtJessica U Yamile Roedl  [facility]   How to contact the [facility] Attending or Consulting provider 7A - 7P or covering provider during after hours 7P -7A, for this patient?  1. Check the care team in [facility] and look for a) attending/consulting [facility] provider listed and b) the [facility] team listed 2. Log into [email or web address] and use [facility]'s universal password to access. If you do not have the password, please  contact the hospital operator. 3. Locate the [facility] provider you are looking for under [facility] and page to a number that you can be directly reached. 4. If you still have difficulty reaching the provider, please page the [facility] (Director on Call) for the Hospitalists listed on amion for assistance.  11/14/2020, 1:57 PM

## 2020-11-15 ENCOUNTER — Inpatient Hospital Stay (HOSPITAL_COMMUNITY): Payer: Medicare Other

## 2020-11-15 DIAGNOSIS — I469 Cardiac arrest, cause unspecified: Secondary | ICD-10-CM | POA: Diagnosis present

## 2020-11-15 LAB — POCT I-STAT 7, (LYTES, BLD GAS, ICA,H+H)
Acid-base deficit: 6 mmol/L — ABNORMAL HIGH (ref 0.0–2.0)
Acid-base deficit: 6 mmol/L — ABNORMAL HIGH (ref 0.0–2.0)
Bicarbonate: 17.2 mmol/L — ABNORMAL LOW (ref 20.0–28.0)
Bicarbonate: 18 mmol/L — ABNORMAL LOW (ref 20.0–28.0)
Calcium, Ion: 1.19 mmol/L (ref 1.15–1.40)
Calcium, Ion: 1.23 mmol/L (ref 1.15–1.40)
HCT: 20 % — ABNORMAL LOW (ref 39.0–52.0)
HCT: 23 % — ABNORMAL LOW (ref 39.0–52.0)
Hemoglobin: 6.8 g/dL — CL (ref 13.0–17.0)
Hemoglobin: 7.8 g/dL — ABNORMAL LOW (ref 13.0–17.0)
O2 Saturation: 100 %
O2 Saturation: 97 %
Patient temperature: 98.6
Patient temperature: 36.1
Potassium: 3.4 mmol/L — ABNORMAL LOW (ref 3.5–5.1)
Potassium: 4.1 mmol/L (ref 3.5–5.1)
Sodium: 140 mmol/L (ref 135–145)
Sodium: 139 mmol/L (ref 135–145)
TCO2: 18 mmol/L — ABNORMAL LOW (ref 22–32)
TCO2: 19 mmol/L — ABNORMAL LOW (ref 22–32)
pCO2 arterial: 24 mmHg — ABNORMAL LOW (ref 32.0–48.0)
pCO2 arterial: 27.6 mmHg — ABNORMAL LOW (ref 32.0–48.0)
pH, Arterial: 7.463 — ABNORMAL HIGH (ref 7.350–7.450)
pH, Arterial: 7.417 (ref 7.350–7.450)
pO2, Arterial: 326 mmHg — ABNORMAL HIGH (ref 83.0–108.0)
pO2, Arterial: 81 mmHg — ABNORMAL LOW (ref 83.0–108.0)

## 2020-11-15 LAB — COMPREHENSIVE METABOLIC PANEL
ALT: 173 U/L — ABNORMAL HIGH (ref 0–44)
AST: 195 U/L — ABNORMAL HIGH (ref 15–41)
Albumin: 2.2 g/dL — ABNORMAL LOW (ref 3.5–5.0)
Alkaline Phosphatase: 91 U/L (ref 38–126)
Anion gap: 12 (ref 5–15)
BUN: 16 mg/dL (ref 8–23)
CO2: 15 mmol/L — ABNORMAL LOW (ref 22–32)
Calcium: 8.2 mg/dL — ABNORMAL LOW (ref 8.9–10.3)
Chloride: 111 mmol/L (ref 98–111)
Creatinine, Ser: 1.64 mg/dL — ABNORMAL HIGH (ref 0.61–1.24)
GFR, Estimated: 46 mL/min — ABNORMAL LOW (ref >60)
Glucose, Bld: 197 mg/dL — ABNORMAL HIGH (ref 70–99)
Potassium: 3.9 mmol/L (ref 3.5–5.1)
Sodium: 138 mmol/L (ref 135–145)
Total Bilirubin: 1 mg/dL (ref 0.3–1.2)
Total Protein: 6.1 g/dL — ABNORMAL LOW (ref 6.5–8.1)

## 2020-11-15 LAB — CBC
HCT: 22.9 % — ABNORMAL LOW (ref 39.0–52.0)
Hemoglobin: 7.4 g/dL — ABNORMAL LOW (ref 13.0–17.0)
MCH: 26.1 pg (ref 26.0–34.0)
MCHC: 32.3 g/dL (ref 30.0–36.0)
MCV: 80.9 fL (ref 80.0–100.0)
Platelets: 382 10*3/uL (ref 150–400)
RBC: 2.83 MIL/uL — ABNORMAL LOW (ref 4.22–5.81)
RDW: 15.3 % (ref 11.5–15.5)
WBC: 36.4 10*3/uL — ABNORMAL HIGH (ref 4.0–10.5)
nRBC: 0 % (ref 0.0–0.2)

## 2020-11-15 LAB — BASIC METABOLIC PANEL
Anion gap: 7 (ref 5–15)
BUN: 21 mg/dL (ref 8–23)
CO2: 18 mmol/L — ABNORMAL LOW (ref 22–32)
Calcium: 8.2 mg/dL — ABNORMAL LOW (ref 8.9–10.3)
Chloride: 112 mmol/L — ABNORMAL HIGH (ref 98–111)
Creatinine, Ser: 1.69 mg/dL — ABNORMAL HIGH (ref 0.61–1.24)
GFR, Estimated: 44 mL/min — ABNORMAL LOW (ref >60)
Glucose, Bld: 124 mg/dL — ABNORMAL HIGH (ref 70–99)
Potassium: 4.2 mmol/L (ref 3.5–5.1)
Sodium: 137 mmol/L (ref 135–145)

## 2020-11-15 LAB — ECHOCARDIOGRAM COMPLETE
Area-P 1/2: 2.69 cm2
Height: 73 in
S' Lateral: 2.3 cm
Weight: 2786.61 oz

## 2020-11-15 LAB — PROTIME-INR
INR: 1.2 (ref 0.8–1.2)
Prothrombin Time: 14.9 seconds (ref 11.4–15.2)

## 2020-11-15 LAB — PHOSPHORUS
Phosphorus: 2.6 mg/dL (ref 2.5–4.6)
Phosphorus: 2.4 mg/dL — ABNORMAL LOW (ref 2.5–4.6)

## 2020-11-15 LAB — GLUCOSE, CAPILLARY
Glucose-Capillary: 164 mg/dL — ABNORMAL HIGH (ref 70–99)
Glucose-Capillary: 180 mg/dL — ABNORMAL HIGH (ref 70–99)
Glucose-Capillary: 171 mg/dL — ABNORMAL HIGH (ref 70–99)
Glucose-Capillary: 169 mg/dL — ABNORMAL HIGH (ref 70–99)
Glucose-Capillary: 114 mg/dL — ABNORMAL HIGH (ref 70–99)
Glucose-Capillary: 132 mg/dL — ABNORMAL HIGH (ref 70–99)
Glucose-Capillary: 154 mg/dL — ABNORMAL HIGH (ref 70–99)

## 2020-11-15 LAB — PREPARE RBC (CROSSMATCH)

## 2020-11-15 LAB — TROPONIN I (HIGH SENSITIVITY)
Troponin I (High Sensitivity): 1493 ng/L (ref <18)
Troponin I (High Sensitivity): 2772 ng/L (ref <18)

## 2020-11-15 LAB — MAGNESIUM
Magnesium: 1.7 mg/dL (ref 1.7–2.4)
Magnesium: 2.6 mg/dL — ABNORMAL HIGH (ref 1.7–2.4)
Magnesium: 2.4 mg/dL (ref 1.7–2.4)

## 2020-11-15 LAB — APTT: aPTT: 32 seconds (ref 24–36)

## 2020-11-15 LAB — LACTIC ACID, PLASMA: Lactic Acid, Venous: 1.3 mmol/L (ref 0.5–1.9)

## 2020-11-15 MED ORDER — MAGNESIUM SULFATE 2 GM/50ML IV SOLN
2.0000 g | Freq: Once | INTRAVENOUS | Status: AC
  Administered 2020-11-15: 07:00:00 2 g via INTRAVENOUS
  Filled 2020-11-15: qty 50

## 2020-11-15 MED ORDER — ALTEPLASE 2 MG IJ SOLR
2.0000 mg | Freq: Once | INTRAMUSCULAR | Status: DC | PRN
  Filled 2020-11-15: qty 2

## 2020-11-15 MED ORDER — VITAL AF 1.2 CAL PO LIQD
1000.0000 mL | ORAL | Status: DC
  Administered 2020-11-15 – 2020-11-17 (×3): 1000 mL
  Filled 2020-11-15: qty 1000

## 2020-11-15 MED ORDER — NOREPINEPHRINE 4 MG/250ML-% IV SOLN
0.0000 ug/min | INTRAVENOUS | Status: DC
  Administered 2020-11-15: 01:00:00 5 ug/min via INTRAVENOUS

## 2020-11-15 MED ORDER — PROSOURCE TF PO LIQD: 45.0000 mL | Freq: Two times a day (BID) | ORAL | Status: DC

## 2020-11-15 MED ORDER — SODIUM CHLORIDE 0.9 % IV SOLN
250.0000 mL | INTRAVENOUS | Status: DC
  Administered 2020-11-25: 250 mL via INTRAVENOUS

## 2020-11-15 MED ORDER — MIRTAZAPINE 15 MG PO TBDP
15.0000 mg | ORAL_TABLET | Freq: Every day | ORAL | Status: DC
  Administered 2020-11-16 – 2020-11-18 (×3): 15 mg
  Filled 2020-11-15 (×3): qty 1

## 2020-11-15 MED ORDER — LACTATED RINGERS IV BOLUS
1000.0000 mL | Freq: Once | INTRAVENOUS | Status: AC
  Administered 2020-11-15: 11:00:00 1000 mL via INTRAVENOUS

## 2020-11-15 MED ORDER — ASPIRIN 81 MG PO CHEW
81.0000 mg | CHEWABLE_TABLET | Freq: Every day | ORAL | Status: DC
  Administered 2020-11-16 – 2020-11-18 (×3): 81 mg
  Filled 2020-11-15 (×3): qty 1

## 2020-11-15 MED ORDER — BENZTROPINE MESYLATE 1 MG PO TABS
1.0000 mg | ORAL_TABLET | Freq: Every day | ORAL | Status: DC
  Administered 2020-11-16 – 2020-11-18 (×3): 1 mg
  Filled 2020-11-15 (×3): qty 1

## 2020-11-15 MED ORDER — LACTATED RINGERS IV BOLUS
1000.0000 mL | Freq: Once | INTRAVENOUS | Status: AC
  Administered 2020-11-15: 16:00:00 1000 mL via INTRAVENOUS

## 2020-11-15 MED ORDER — FENTANYL CITRATE (PF) 100 MCG/2ML IJ SOLN
25.0000 ug | INTRAMUSCULAR | Status: DC | PRN
  Administered 2020-11-15 (×2): 50 ug via INTRAVENOUS
  Administered 2020-11-16: 25 ug via INTRAVENOUS
  Filled 2020-11-15 (×5): qty 2

## 2020-11-15 MED ORDER — SODIUM CHLORIDE 0.9% FLUSH: 10.0000 mL | INTRAVENOUS | Status: DC | PRN

## 2020-11-15 MED ORDER — CHLORHEXIDINE GLUCONATE 0.12 % MT SOLN
15.0000 mL | Freq: Two times a day (BID) | OROMUCOSAL | Status: DC
  Administered 2020-11-15 – 2020-11-30 (×29): 15 mL via OROMUCOSAL
  Filled 2020-11-15 (×17): qty 15

## 2020-11-15 MED ORDER — INSULIN ASPART 100 UNIT/ML ~~LOC~~ SOLN
0.0000 [IU] | SUBCUTANEOUS | Status: DC
  Administered 2020-11-15: 20:00:00 1 [IU] via SUBCUTANEOUS
  Administered 2020-11-15 (×2): 2 [IU] via SUBCUTANEOUS
  Administered 2020-11-16 (×2): 1 [IU] via SUBCUTANEOUS
  Administered 2020-11-16: 12:00:00 2 [IU] via SUBCUTANEOUS
  Administered 2020-11-16: 08:00:00 3 [IU] via SUBCUTANEOUS
  Administered 2020-11-16: 04:00:00 1 [IU] via SUBCUTANEOUS
  Administered 2020-11-16: 20:00:00 2 [IU] via SUBCUTANEOUS
  Administered 2020-11-17: 15:00:00 1 [IU] via SUBCUTANEOUS
  Administered 2020-11-17: 09:00:00 2 [IU] via SUBCUTANEOUS
  Administered 2020-11-17 (×2): 1 [IU] via SUBCUTANEOUS
  Administered 2020-11-18 (×2): 2 [IU] via SUBCUTANEOUS
  Administered 2020-11-18 (×2): 1 [IU] via SUBCUTANEOUS
  Administered 2020-11-18: 21:00:00 2 [IU] via SUBCUTANEOUS
  Administered 2020-11-19 (×4): 1 [IU] via SUBCUTANEOUS
  Administered 2020-11-20: 20:00:00 2 [IU] via SUBCUTANEOUS
  Administered 2020-11-20: 09:00:00 1 [IU] via SUBCUTANEOUS
  Administered 2020-11-21: 16:00:00 3 [IU] via SUBCUTANEOUS
  Administered 2020-11-21: 04:00:00 2 [IU] via SUBCUTANEOUS
  Administered 2020-11-21: 08:00:00 5 [IU] via SUBCUTANEOUS
  Administered 2020-11-21: 12:00:00 3 [IU] via SUBCUTANEOUS
  Administered 2020-11-21 (×2): 2 [IU] via SUBCUTANEOUS
  Administered 2020-11-22: 1 [IU] via SUBCUTANEOUS
  Administered 2020-11-22 (×3): 3 [IU] via SUBCUTANEOUS
  Administered 2020-11-22: 04:00:00 1 [IU] via SUBCUTANEOUS
  Administered 2020-11-22: 2 [IU] via SUBCUTANEOUS
  Administered 2020-11-23: 1 [IU] via SUBCUTANEOUS
  Administered 2020-11-23: 2 [IU] via SUBCUTANEOUS
  Administered 2020-11-23 – 2020-11-24 (×2): 1 [IU] via SUBCUTANEOUS
  Administered 2020-11-24: 2 [IU] via SUBCUTANEOUS
  Administered 2020-11-24 (×2): 1 [IU] via SUBCUTANEOUS
  Administered 2020-11-24 – 2020-11-25 (×4): 2 [IU] via SUBCUTANEOUS
  Administered 2020-11-26 – 2020-11-27 (×8): 1 [IU] via SUBCUTANEOUS
  Administered 2020-11-27: 2 [IU] via SUBCUTANEOUS
  Administered 2020-11-28 (×4): 1 [IU] via SUBCUTANEOUS
  Administered 2020-11-28 – 2020-11-29 (×7): 2 [IU] via SUBCUTANEOUS
  Administered 2020-11-29: 1 [IU] via SUBCUTANEOUS
  Administered 2020-11-30 (×4): 2 [IU] via SUBCUTANEOUS

## 2020-11-15 MED ORDER — PHENYLEPHRINE HCL-NACL 10-0.9 MG/250ML-% IV SOLN
25.0000 ug/min | INTRAVENOUS | Status: DC
  Administered 2020-11-15: 01:00:00 100 ug/min via INTRAVENOUS
  Filled 2020-11-15: qty 250

## 2020-11-15 MED ORDER — HEPARIN SODIUM (PORCINE) 5000 UNIT/ML IJ SOLN
5000.0000 [IU] | Freq: Three times a day (TID) | INTRAMUSCULAR | Status: DC
  Administered 2020-11-15 – 2020-11-16 (×3): 5000 [IU] via SUBCUTANEOUS
  Filled 2020-11-15 (×3): qty 1

## 2020-11-15 MED ORDER — SODIUM CHLORIDE 0.9% FLUSH
10.0000 mL | Freq: Two times a day (BID) | INTRAVENOUS | Status: DC
  Administered 2020-11-15: 10:00:00 30 mL
  Administered 2020-11-15 – 2020-12-03 (×28): 10 mL

## 2020-11-15 MED ORDER — SODIUM CHLORIDE 0.9 % IV SOLN: INTRAVENOUS | Status: DC

## 2020-11-15 MED ORDER — SODIUM CHLORIDE 0.9% IV SOLUTION: Freq: Once | INTRAVENOUS | Status: DC

## 2020-11-15 MED ORDER — HEPARIN SODIUM (PORCINE) 5000 UNIT/ML IJ SOLN: 5000.0000 [IU] | Freq: Three times a day (TID) | INTRAMUSCULAR | Status: DC

## 2020-11-15 MED ORDER — PANTOPRAZOLE SODIUM 40 MG IV SOLR
40.0000 mg | Freq: Every day | INTRAVENOUS | Status: DC
  Administered 2020-11-15 – 2020-11-18 (×4): 40 mg via INTRAVENOUS
  Filled 2020-11-15 (×4): qty 40

## 2020-11-15 MED ORDER — HEPARIN (PORCINE) 25000 UT/250ML-% IV SOLN
1000.0000 [IU]/h | INTRAVENOUS | Status: DC
  Administered 2020-11-15: 07:00:00 1000 [IU]/h via INTRAVENOUS
  Filled 2020-11-15: qty 250

## 2020-11-15 MED ORDER — VITAL HIGH PROTEIN PO LIQD: 1000.0000 mL | ORAL | Status: DC

## 2020-11-15 MED ORDER — METOPROLOL TARTRATE 5 MG/5ML IV SOLN: 2.5000 mg | Freq: Four times a day (QID) | INTRAVENOUS | Status: DC

## 2020-11-15 MED ORDER — ASPIRIN 300 MG RE SUPP
300.0000 mg | RECTAL | Status: AC
  Administered 2020-11-15: 06:00:00 300 mg via RECTAL
  Filled 2020-11-15: qty 1

## 2020-11-15 MED ORDER — PIPERACILLIN-TAZOBACTAM 3.375 G IVPB
3.3750 g | Freq: Three times a day (TID) | INTRAVENOUS | Status: AC
  Administered 2020-11-15 – 2020-11-19 (×14): 3.375 g via INTRAVENOUS
  Filled 2020-11-15 (×14): qty 50

## 2020-11-15 MED ORDER — ORAL CARE MOUTH RINSE
15.0000 mL | OROMUCOSAL | Status: DC
  Administered 2020-11-15 – 2020-11-18 (×34): 15 mL via OROMUCOSAL

## 2020-11-15 MED ORDER — DEXMEDETOMIDINE HCL IN NACL 400 MCG/100ML IV SOLN
0.0000 ug/kg/h | INTRAVENOUS | Status: DC
  Administered 2020-11-15: 10:00:00 0.4 ug/kg/h via INTRAVENOUS
  Administered 2020-11-16: 16:00:00 0.6 ug/kg/h via INTRAVENOUS
  Administered 2020-11-16: 07:00:00 0.5 ug/kg/h via INTRAVENOUS
  Administered 2020-11-17: 08:00:00 0.4 ug/kg/h via INTRAVENOUS
  Administered 2020-11-17: 02:00:00 1 ug/kg/h via INTRAVENOUS
  Filled 2020-11-15 (×7): qty 100

## 2020-11-15 NOTE — Progress Notes (Signed)
EEG complete - results pending LTM to follow same leads used  

## 2020-11-15 NOTE — Progress Notes (Signed)
eLink Physician-Brief Progress Note Patient Name: Ricky Hanson DOB: [birth year] MRN: [id number]   Date of Service  11/15/2020  HPI/Events of Note  Troponin #1 = 1493. Head CT Scan reveals no acute finding or change from 6 days ago and chronic ischemic insults.  eICU Interventions  Plan: 1. ASA 81 mg per tube now and Q day. 2. Metoprolol 2.5 mg IV now and Q 6 hours. Hold for SBP < 105 or HR < 60. 3. Heparin IV infusion per pharmacy protocol.  4. Continue to trend Troponin.      Intervention Category Major Interventions: Other:  [name] 11/15/2020, 5:56 AM

## 2020-11-15 NOTE — Plan of Care (Signed)
  Problem: Activity: Goal: Activity intolerance will improve Outcome: Not Progressing   Problem: Nutritional: Goal: Ability to make appropriate dietary choices will improve Outcome: Not Progressing Note: Will assess if tube feeds need to be started today.

## 2020-11-15 NOTE — Consult Note (Signed)
NAME:  Ricky Hanson, MRN:  [id number], DOB:  09-Dec-1950, LOS: 6 ADMISSION DATE:  11/08/2020, CONSULTATION DATE: 12/22 REFERRING MD: Dr. Zenaida Niece, CHIEF COMPLAINT: Cardiac arrest  Brief History:  69 year old male status post cardiac arrest  History of Present Illness:  This is a 69 year old black male that presented originally with obstructive uropathy and acute renal insufficiency.  He had been diagnosed during the hospital stay with a urinary tract infection.  He developed ventricular fibrillation and cardiac arrest.  Patient was pulseless for approximately 20 minutes.  Patient was intubated during this event.  Past Medical History:  Diabetes mellitus Hyperlipidemia Hypertension Schizophrenia  Significant Hospital Events:  Cardiac arrest  Consults:  Neurology  Procedures:  Triple-lumen catheter 12/22 Arterial line 12/22  Significant Diagnostic Tests:  12/22 CXR - ETT in place, L subclavian CL in place. Bilateral patchy opacity at lung bases. Edema vs infection. 12/22 CT Head w/o C - No changes from imaging 6 days prior. Chronic ischemic insults  Micro Data:  Covid negative Influenza negative Urine: Providencia  Antimicrobials:  12/15-12/21 Cefepime completed 7 day course 12/22 Zosyn   Interim history/subjective:  Patient intubated. Cousin at bedside with son on the phone. Updated family of plan and answered questions.   Objective   Blood pressure 136/78, pulse 94, temperature 98.24 F (36.8 C), resp. rate (!) 29, height 6\' 1"  (1.854 m), weight 79 kg, SpO2 100 %.    Vent Mode: PRVC FiO2 (%):  [0 %-100 %] 40 % Set Rate:  [22 bmp] 22 bmp Vt Set:  [630 mL] 630 mL PEEP:  [5 cmH20] 5 cmH20 Plateau Pressure:  [19 cmH20-25 cmH20] 19 cmH20   Intake/Output Summary (Last 24 hours) at 11/15/2020 1000 Last data filed at 11/15/2020 0956 Gross per 24 hour  Intake 1941.98 ml  Output 2330 ml  Net -388.02 ml   Filed Weights   11/13/20 0500 11/14/20 0450 11/15/20 0130   Weight: 76.3 kg 77.5 kg 79 kg    Examination: General: Ill appearing HENT: Orally intubated with endotracheal tube.  Mucous membranes are moist. Eyes open Lungs: Clear to auscultation bilaterally no wheezing Cardiovascular: Regular rate Abdomen: Soft nondistended. BS + Extremities: Contracture of right wrist Neuro: Not following commands.  Localizes painful stimuli. No blink reflex  Assessment & Plan:  Acute respiratory failure Status post cardiac arrest Ventricular fibrillation Patient with episode of V tach yesterday evening, code blue was called, CPR performed for 20 minutes. Patient intubated during this time, ROSC was achieved. Currently requiring mechanical ventilation. Uncertain etiology at this time as to why patient has had cardiac arrest event. Troponins elevated post CPR, suspect secondary to compressions. EKG with sinus tachycardia, no ST elevation present. Bedside cardiac 11/17/20 with no deformities, valvular disease, right ventricular enlargement. Head CT w/o C negative for acute changes from prior 12/16, chronic ischemic insults present. Per patient's family member baseline is alert and oriented with use of his extremities. Currently eyes open, not following commands and without corneal reflex.  Plan:  - continue vent protocol and maintenance, SAT/SBT as tolerable - titrate vent to maintain O2 sat greater than or equal to 90% sat - TTE ordered to further assess cardiac etiologies - MAP goal over 65, wean levophed as tolerable  Leukocytosis Patient with elevated WBC of 36.4. Prior of 14.5 2 days ago, improved after 7 day course of cefepime for suspected UTI. Etiologies include infection vs post procedural leukocytosis. Patient is not febrile, do not suspect sepsis at this time. Will start antibiotics and trend  white count.  - start zosyn - trend CBC - monitor vitals, if febrile consider repeat blood cultures - tracheal aspirate ordered  Encephalopathy Uncertain etiology at  this time, possibly related to cardiac arrest event vs anoxia. Will continue to assess mental status during admission. Family note patient is alert and oriented at baseline, with the ability to move extremities and perform some ADL's. Patient with history of schizophrenia, may be contributing as well. He receives monthly haldol injections. Patient with contracture of right wrist, not baseline per patient's family member but does note patient has a history of right wrist/hand weakness.  - EEG ordered  - plan for MRI head in 2 days to assess for anoxic brain injury - RASS goal of -1 to -2  Normocytic Anemia Hgb of 6.8 early this am. 1 unit of blood given, improved to 7.4.  - Recheck afternoon H&H to make certain continuing to increase. - daily CBC - transfuse if hgb < 7  Diabetes Feeding tube in place, continue SSI protocol. Nutrition following - SSI - hypoglycemia protocol in place  Acute renal insufficiency on CKD stage III Cr baseline of 1.8. Improving since admission, thought to be secondary to foley catheter misplacement causing urinary retention.  - daily BMP - new foley placed with good output - continue to monitor I/O's  Best practice (evaluated daily)  Diet: N.p.o. Pain/Anxiety/Delirium protocol (if indicated): Fentanyl, Precedex VAP protocol (if indicated): Initiated DVT prophylaxis: Lovenox GI prophylaxis: Protonix Glucose control: Insulin sliding scale Goals of Care: Family updated via facetime 12/22. Dr. Merrily Pew spoke with son and cousin, informed them of uncertainty as to why patient had cardiac event, will need further testing. Mobility: Bedrest Disposition: ICU  Goals of Care:   Code Status: Full  Labs   CBC: Recent Labs  Lab 11/08/20 1640 11/08/20 2139 11/09/20 0500 11/10/20 0319 11/11/20 0342 11/13/20 0419 11/15/20 0113 11/15/20 0345 11/15/20 0946  WBC 19.4*   < > 18.2* 14.8* 14.4* 14.5*  --  36.4*  --   NEUTROABS 15.7*  --  14.6*  --   --   --   --    --   --   HGB 9.3*   < > 8.9* 7.9* 7.9* 8.0* 6.8* 7.4* 7.8*  HCT 29.4*   < > 27.3* 22.8* 24.2* 25.6* 20.0* 22.9* 23.0*  MCV 81.4   < > 80.1 76.5* 78.6* 82.1  --  80.9  --   PLT 528*   < > 545* 506* 468* 453*  --  382  --    < > = values in this interval not displayed.    Basic Metabolic Panel: Recent Labs  Lab 11/10/20 0319 11/11/20 0342 11/13/20 0419 11/14/20 0803 11/15/20 0113 11/15/20 0345 11/15/20 0946  NA 136 138 138 138 140 138 139  K 4.6 4.4 4.3 4.1 3.4* 3.9 4.1  CL 109 109 108 109  --  111  --   CO2 19* 20* 21* 18*  --  15*  --   GLUCOSE 153* 160* 120* 111*  --  197*  --   BUN 31* 19 21 18   --  16  --   CREATININE 1.46* 1.35* 1.69* 1.55*  --  1.64*  --   CALCIUM 8.4* 8.7* 8.9 9.0  --  8.2*  --   MG  --   --   --   --   --  1.7  --    GFR: Estimated Creatinine Clearance: 48.8 mL/min (A) (by C-G formula based on  SCr of 1.64 mg/dL (H)). Recent Labs  Lab 11/08/20 1640 11/08/20 2139 11/10/20 0319 11/11/20 0342 11/13/20 0419 11/15/20 0345  WBC 19.4*   < > 14.8* 14.4* 14.5* 36.4*  LATICACIDVEN 1.0  --   --   --   --  1.3   < > = values in this interval not displayed.    Liver Function Tests: Recent Labs  Lab 11/08/20 1640 11/09/20 0500 11/13/20 0419 11/15/20 0345  AST 16 12* 12* 195*  ALT 23 18 13  173*  ALKPHOS 149* 132* 84 91  BILITOT 1.0 0.6 0.7 1.0  PROT 7.0 6.4* 6.5 6.1*  ALBUMIN 2.6* 2.4* 2.3* 2.2*   Recent Labs  Lab 11/08/20 1640  LIPASE 36   Recent Labs  Lab 11/08/20 1640  AMMONIA 25    ABG    Component Value Date/Time   PHART 7.417 11/15/2020 0946   PCO2ART 27.6 (L) 11/15/2020 0946   PO2ART 81 (L) 11/15/2020 0946   HCO3 18.0 (L) 11/15/2020 0946   TCO2 19 (L) 11/15/2020 0946   ACIDBASEDEF 6.0 (H) 11/15/2020 0946   O2SAT 97.0 11/15/2020 0946     Coagulation Profile: Recent Labs  Lab 11/15/20 0345  INR 1.2    Cardiac Enzymes: No results for input(s): CKTOTAL, CKMB, CKMBINDEX, TROPONINI in the last 168 hours.  HbA1C: Hgb  A1c MFr Bld  Date/Time Value Ref Range Status  11/08/2020 09:39 PM 5.7 (H) 4.8 - 5.6 % Final    Comment:    (NOTE) Pre diabetes:          5.7%-6.4%  Diabetes:              >6.4%  Glycemic control for   <7.0% adults with diabetes   02/02/2020 02:27 PM 5.5 4.8 - 5.6 % Final    Comment:             Prediabetes: 5.7 - 6.4          Diabetes: >6.4          Glycemic control for adults with diabetes: <7.0     CBG: Recent Labs  Lab 11/08/20 1622 11/10/20 1209 11/15/20 0017 11/15/20 0400 11/15/20 0743  GLUCAP 143* 164* 171* 164* 180*    Review of Systems:   Unable to obtain secondary to neurologic condition  Past Medical History:  He,  has a past medical history of Diabetes mellitus without complication (HCC), Elevated random blood glucose level, Foley catheter in place, Hyperlipidemia, Hypertension, and Schizophrenia (HCC).   Surgical History:   Past Surgical History:  Procedure Laterality Date   ABDOMINAL AORTOGRAM W/LOWER EXTREMITY N/A 05/17/2020   Procedure: ABDOMINAL AORTOGRAM W/LOWER EXTREMITY;  Surgeon: 05/19/2020, MD;  Location: [facility];  Service: Cardiovascular;  Laterality: N/A;   AMPUTATION Right 05/19/2020   Procedure: RIGHT BELOW KNEE AMPUTATION;  Surgeon: 05/21/2020, MD;  Location: [facility] OR;  Service: Orthopedics;  Laterality: Right;   PERIPHERAL VASCULAR BALLOON ANGIOPLASTY  05/17/2020   Procedure: PERIPHERAL VASCULAR BALLOON ANGIOPLASTY;  Surgeon: 05/19/2020, MD;  Location: [facility];  Service: Cardiovascular;;  Right PT   urologic procedure after trauma       Social History:   reports that he has been smoking cigarettes. He has been smoking about 0.50 packs per day. He has never used smokeless tobacco. He reports that he does not drink alcohol and does not use drugs.   Family History:  His family history is not on file.   Allergies No Known Allergies  Home Medications  Prior to Admission medications   Medication Sig Start Date  End Date Taking? Authorizing Provider  acetaminophen (TYLENOL) 500 MG tablet Take 1,000 mg by mouth every 6 (six) hours as needed for mild pain.    [provider]  amLODipine (NORVASC) 10 MG tablet Take 10 mg by mouth daily.    [provider]  aspirin 81 MG chewable tablet Chew 1 tablet (81 mg total) by mouth daily. 05/24/20   Derrel Nipresenzo, Victor, MD  atorvastatin (LIPITOR) 20 MG tablet Take 1 tablet (20 mg total) by mouth daily. 05/24/20   Derrel Nipresenzo, Victor, MD  benztropine (COGENTIN) 1 MG tablet Take 1 mg by mouth at bedtime.    [provider]  haloperidol decanoate (HALDOL DECANOATE) 100 MG/ML injection Inject 100 mg into the muscle every 28 (twenty-eight) days. Inject 2 mLs every 4 weeks    [provider]  lisinopril (ZESTRIL) 10 MG tablet Take 10 mg by mouth daily.    [provider]     Thalia BloodgoodVasili Merinda Victorino DO  Internal Medicine Resident PGY-1 [facility]  Pager: [phone]

## 2020-11-15 NOTE — Progress Notes (Signed)
ABG obtained on ventilator settings of VT: 630, RR: 22, FIO2: 40%, and PEEP: 5.  No further vent changes at this time.   Ref. Range 11/15/2020 09:46  Sample type Unknown ARTERIAL  pH, Arterial Latest Ref Range: 7.350 - 7.450  7.417  pCO2 arterial Latest Ref Range: 32.0 - 48.0 mmHg 27.6 (L)  pO2, Arterial Latest Ref Range: 83.0 - 108.0 mmHg 81 (L)  TCO2 Latest Ref Range: 22 - 32 mmol/L 19 (L)  Acid-base deficit Latest Ref Range: 0.0 - 2.0 mmol/L 6.0 (H)  Bicarbonate Latest Ref Range: 20.0 - 28.0 mmol/L 18.0 (L)  O2 Saturation Latest Units: % 97.0  Patient temperature Unknown 36.1 C  Collection site Unknown art line

## 2020-11-15 NOTE — Progress Notes (Signed)
*  PRELIMINARY RESULTS* Echocardiogram 2D Echocardiogram has been performed.  Ricky Hanson 11/15/2020, 12:59 PM

## 2020-11-15 NOTE — Op Note (Signed)
Central Venous Catheter Insertion Procedure Note  Ricky Hanson  [id number]  1951/03/07  Date:11/15/20  Time:1:46 AM   Provider Performing:Madalena Kesecker R Ellasyn Swilling   Procedure: Insertion of Non-tunneled Central Venous Catheter(36556) without US guidance  Indication(s) Medication administration  Consent Unable to obtain consent due to emergent nature of procedure.  Anesthesia Topical only with 1% lidocaine   Timeout Verified patient identification, verified procedure, site/side was marked, verified correct patient position, special equipment/implants available, medications/allergies/relevant history reviewed, required imaging and test results available.  Sterile Technique Maximal sterile technique including full sterile barrier drape, hand hygiene, sterile gown, sterile gloves, mask, hair covering, sterile ultrasound probe cover (if used).  Procedure Description Area of catheter insertion was cleaned with chlorhexidine and draped in sterile fashion.   a central venous catheter was placed into the left subclavian vein on the first attempt using the schlinger techniquie. Nonpulsatile blood flow and easy flushing noted in all ports.  The catheter was sutured in place and sterile dressing applied.  Complications/Tolerance None; patient tolerated the procedure well. Chest X-ray is ordered to verify placement subclavian cannulation.     EBL Minimal  Specimen(s) None

## 2020-11-15 NOTE — Procedures (Signed)
Arterial Catheter Insertion Procedure Note  Eleuterio Dollar  [id number]  1951-03-15  Date:11/15/20  Time:1:43 AM    Provider Performing: Darcella Gasman Kyrene Longan    Procedure: Insertion of Arterial Line (67124) with US guidance (58099)   Indication(s) Blood pressure monitoring and/or need for frequent ABGs  Consent Unable to obtain consent due to emergent nature of procedure.  Anesthesia None   Time Out Verified patient identification, verified procedure, site/side was marked, verified correct patient position, special equipment/implants available, medications/allergies/relevant history reviewed, required imaging and test results available.   Sterile Technique Maximal sterile technique including full sterile barrier drape, hand hygiene, sterile gown, sterile gloves, mask, hair covering, sterile ultrasound probe cover (if used).   Procedure Description Area of catheter insertion was cleaned with chlorhexidine and draped in sterile fashion. With real-time ultrasound guidance an arterial catheter was placed into the right radial artery.  Appropriate arterial tracings confirmed on monitor.     Complications/Tolerance None; patient tolerated the procedure well.   EBL Minimal   Specimen(s) None  Darcella Gasman Reyanna Baley, PA-C

## 2020-11-15 NOTE — Progress Notes (Signed)
[facility] ADULT ICU REPLACEMENT PROTOCOL   The patient does apply for the [facility] Adult ICU Electrolyte Replacment Protocol based on the criteria listed below:   1. Is GFR >/= 30 ml/min? Yes.    Patient's GFR today is 46 2. Is SCr </= 2? Yes.   Patient's SCr is 1.64 ml/kg/hr 3. Did SCr increase >/= 0.5 in 24 hours? No. 4. Abnormal electrolyte(s): Mg 1.7 5. Ordered repletion with: per protocol 6. If a panic level lab has been reported, has the CCM MD in charge been notified? No..   Physician:    Boris Sharper Olin Gurski 11/15/2020 6:01 AM

## 2020-11-15 NOTE — Progress Notes (Signed)
eLink Physician-Brief Progress Note Patient Name: Ricky Hanson DOB: 1951/01/16 MRN: [id number]   Date of Service  11/15/2020  HPI/Events of Note  TTM - Nursing request for head CT scan and Temp Foley catheter. Patient already has Foley catheter.   eICU Interventions  Plan: 1. Head CT Scan w/o contrast STAT. 2. Change Foley to Temperature probe Foley.      Intervention Category Major Interventions: Other:  Jess Sulak Dennard Nip 11/15/2020, 4:07 AM

## 2020-11-15 NOTE — Procedures (Addendum)
Patient Name: Ricky Hanson  MRN: [id number]  Epilepsy Attending: Charlsie Quest  Referring Physician/Provider: Dr. Glyn Ade Date: 11/15/2020 Duration: 26.18 minutes  Patient history: 69 year old man status post cardiac arrest.  EEG to evaluate for seizures.  Level of alertness: Awake  AEDs during EEG study: None  Technical aspects: This EEG study was done with scalp electrodes positioned according to the 10-20 International system of electrode placement. Electrical activity was acquired at a sampling rate of 500Hz  and reviewed with a high frequency filter of 70Hz  and a low frequency filter of 1Hz . EEG data were recorded continuously and digitally stored.   Description: No clear posterior dominant rhythm was seen.  EEG showed continuous generalized 2 to 3 Hz delta slowing.  Hyperventilation and photic stimulation were not performed.     ABNORMALITY -Continuous slow, generalized  IMPRESSION: This study is suggestive of severe diffuse encephalopathy, nonspecific etiology. No seizures or epileptiform discharges were seen throughout the recording.  Ricky Hanson 

## 2020-11-15 NOTE — Consult Note (Signed)
NAME:  Ricky Hanson, MRN:  [id number], DOB:  Mar 23, 1951, LOS: 6 ADMISSION DATE:  11/08/2020, CONSULTATION DATE: 12/22 REFERRING MD: Dr. Zenaida Niece, CHIEF COMPLAINT: Cardiac arrest  Brief History:  69 year old male status post cardiac arrest  History of Present Illness:  This is a 69 year old black male that presented originally with obstructive uropathy and acute renal insufficiency.  He had been diagnosed during the hospital stay with a urinary tract infection.  He developed ventricular fibrillation and cardiac arrest.  Patient was pulseless for approximately 20 minutes.  Patient was intubated during this event.  Past Medical History:  Diabetes mellitus Hyperlipidemia Hypertension Schizophrenia  Significant Hospital Events:  Cardiac arrest  Consults:   Procedures:  Triple-lumen catheter 12/22 Arterial line 12/22   Micro Data:  Covid negative Influenza negative Urine: Providencia  Antimicrobials:  Cefepime    Objective   Blood pressure 110/64, pulse (!) 102, temperature 97.6 F (36.4 C), temperature source Oral, resp. rate 20, height 6\' 1"  (1.854 m), weight 79 kg, SpO2 100 %.    Vent Mode: PRVC FiO2 (%):  [100 %] 100 % Set Rate:  [22 bmp] 22 bmp Vt Set:  [630 mL] 630 mL PEEP:  [5 cmH20] 5 cmH20   Intake/Output Summary (Last 24 hours) at 11/15/2020 0220 Last data filed at 11/15/2020 0200 Gross per 24 hour  Intake 1260.06 ml  Output 2975 ml  Net -1714.94 ml   Filed Weights   11/13/20 0500 11/14/20 0450 11/15/20 0130  Weight: 76.3 kg 77.5 kg 79 kg    Examination: General: No acute distress HENT: Orally intubated with endotracheal tube.  Mucous membranes are moist. Lungs: Clear to auscultation bilaterally no wheezing Cardiovascular: Regular rate Abdomen: Soft nondistended no rebound/rigidity/guarding the limited by neurologic status. Extremities: Distal pulse intact x4.  No cyanosis.  No significant edema. Neuro: Unconscious minimally responsive.  Eyes are  open.  Not following commands.  Localizes painful stimuli   Assessment & Plan:  Acute respiratory failure Status post cardiac arrest Ventricular fibrillation Anoxic encephalopathy Diabetes Acute renal insufficiency on CKD stage III  Plan: Patient is transferred to the intensive care unit post cardiac arrest. Standard ventilator protocol was initiated. Triple-lumen catheter was placed. Wean Levophed for MAP greater than 60. Full set of labs are pending post arrest. Monitor I's/O's. Foley catheter in place. Continue cefepime as previously ordered. Hypothermia protocol Continue to monitor hemoglobin levels  Best practice (evaluated daily)  Diet: N.p.o. Pain/Anxiety/Delirium protocol (if indicated): None VAP protocol (if indicated): Initiated DVT prophylaxis: Lovenox GI prophylaxis: Protonix Glucose control: Insulin sliding scale Mobility: Bedrest Disposition: Transferred to the intensive care unit  Goals of Care:   Code Status: Full  Labs   CBC: Recent Labs  Lab 11/08/20 1640 11/08/20 2139 11/09/20 0500 11/10/20 0319 11/11/20 0342 11/13/20 0419 11/15/20 0113  WBC 19.4* 18.3* 18.2* 14.8* 14.4* 14.5*  --   NEUTROABS 15.7*  --  14.6*  --   --   --   --   HGB 9.3* 8.3* 8.9* 7.9* 7.9* 8.0* 6.8*  HCT 29.4* 25.6* 27.3* 22.8* 24.2* 25.6* 20.0*  MCV 81.4 79.0* 80.1 76.5* 78.6* 82.1  --   PLT 528* 499* 545* 506* 468* 453*  --     Basic Metabolic Panel: Recent Labs  Lab 11/09/20 0500 11/10/20 0319 11/11/20 0342 11/13/20 0419 11/14/20 0803 11/15/20 0113  NA 139 136 138 138 138 140  K 5.2* 4.6 4.4 4.3 4.1 3.4*  CL 110 109 109 108 109  --   CO2 19* 19*  20* 21* 18*  --   GLUCOSE 135* 153* 160* 120* 111*  --   BUN 57* 31* 19 21 18   --   CREATININE 2.22* 1.46* 1.35* 1.69* 1.55*  --   CALCIUM 8.7* 8.4* 8.7* 8.9 9.0  --    GFR: Estimated Creatinine Clearance: 51.7 mL/min (A) (by C-G formula based on SCr of 1.55 mg/dL (H)). Recent Labs  Lab 11/08/20 1640  11/08/20 2139 11/09/20 0500 11/10/20 0319 11/11/20 0342 11/13/20 0419  WBC 19.4*   < > 18.2* 14.8* 14.4* 14.5*  LATICACIDVEN 1.0  --   --   --   --   --    < > = values in this interval not displayed.    Liver Function Tests: Recent Labs  Lab 11/08/20 1640 11/09/20 0500 11/13/20 0419  AST 16 12* 12*  ALT 23 18 13   ALKPHOS 149* 132* 84  BILITOT 1.0 0.6 0.7  PROT 7.0 6.4* 6.5  ALBUMIN 2.6* 2.4* 2.3*   Recent Labs  Lab 11/08/20 1640  LIPASE 36   Recent Labs  Lab 11/08/20 1640  AMMONIA 25    ABG    Component Value Date/Time   PHART 7.463 (H) 11/15/2020 0113   PCO2ART 24.0 (L) 11/15/2020 0113   PO2ART 326 (H) 11/15/2020 0113   HCO3 17.2 (L) 11/15/2020 0113   TCO2 18 (L) 11/15/2020 0113   ACIDBASEDEF 6.0 (H) 11/15/2020 0113   O2SAT 100.0 11/15/2020 0113     Coagulation Profile: No results for input(s): INR, PROTIME in the last 168 hours.  Cardiac Enzymes: No results for input(s): CKTOTAL, CKMB, CKMBINDEX, TROPONINI in the last 168 hours.  HbA1C: Hgb A1c MFr Bld  Date/Time Value Ref Range Status  11/08/2020 09:39 PM 5.7 (H) 4.8 - 5.6 % Final    Comment:    (NOTE) Pre diabetes:          5.7%-6.4%  Diabetes:              >6.4%  Glycemic control for   <7.0% adults with diabetes   02/02/2020 02:27 PM 5.5 4.8 - 5.6 % Final    Comment:             Prediabetes: 5.7 - 6.4          Diabetes: >6.4          Glycemic control for adults with diabetes: <7.0     CBG: Recent Labs  Lab 11/08/20 1622 11/10/20 1209  GLUCAP 143* 164*    Review of Systems:   Unable to obtain secondary to neurologic condition  Past Medical History:  He,  has a past medical history of Diabetes mellitus without complication (HCC), Elevated random blood glucose level, Foley catheter in place, Hyperlipidemia, Hypertension, and Schizophrenia (HCC).   Surgical History:   Past Surgical History:  Procedure Laterality Date  . ABDOMINAL AORTOGRAM W/LOWER EXTREMITY N/A 05/17/2020    Procedure: ABDOMINAL AORTOGRAM W/LOWER EXTREMITY;  Surgeon: 11/12/20, MD;  Location: [facility];  Service: Cardiovascular;  Laterality: N/A;  . AMPUTATION Right 05/19/2020   Procedure: RIGHT BELOW KNEE AMPUTATION;  Surgeon: Yates Decamp, MD;  Location: [facility] OR;  Service: Orthopedics;  Laterality: Right;  . PERIPHERAL VASCULAR BALLOON ANGIOPLASTY  05/17/2020   Procedure: PERIPHERAL VASCULAR BALLOON ANGIOPLASTY;  Surgeon: [facility], MD;  Location: [facility];  Service: Cardiovascular;;  Right PT  . urologic procedure after trauma       Social History:   reports that he has been smoking  cigarettes. He has been smoking about 0.50 packs per day. He has never used smokeless tobacco. He reports that he does not drink alcohol and does not use drugs.   Family History:  His family history is not on file.   Allergies No Known Allergies   Home Medications  Prior to Admission medications   Medication Sig Start Date End Date Taking? Authorizing Provider  acetaminophen (TYLENOL) 500 MG tablet Take 1,000 mg by mouth every 6 (six) hours as needed for mild pain.    [provider]  amLODipine (NORVASC) 10 MG tablet Take 10 mg by mouth daily.    [provider]  aspirin 81 MG chewable tablet Chew 1 tablet (81 mg total) by mouth daily. 05/24/20   Derrel Nip, MD  atorvastatin (LIPITOR) 20 MG tablet Take 1 tablet (20 mg total) by mouth daily. 05/24/20   Derrel Nip, MD  benztropine (COGENTIN) 1 MG tablet Take 1 mg by mouth at bedtime.    [provider]  haloperidol decanoate (HALDOL DECANOATE) 100 MG/ML injection Inject 100 mg into the muscle every 28 (twenty-eight) days. Inject 2 mLs every 4 weeks    [provider]  lisinopril (ZESTRIL) 10 MG tablet Take 10 mg by mouth daily.    [provider]     Critical care time: 55 minutes not including procedural time

## 2020-11-15 NOTE — Progress Notes (Signed)
Pt transported from from 2M09 to CT and back with no complications.

## 2020-11-15 NOTE — Progress Notes (Signed)
eLink Physician-Brief Progress Note Patient Name: Ricky Hanson DOB: 10-08-1951 MRN: [id number]   Date of Service  11/15/2020  HPI/Events of Note  Hypotension - VT/VF arrest from floor. Now hypotensive. BP = 70/47 with MAP = 56. No CVL or CVP.  eICU Interventions  Plan: 1. Phenylephrine IV infusion via PIV. Titrate to MAP >= 65.      Intervention Category Major Interventions: Hypotension - evaluation and management  Deklyn Gibbon Eugene 11/15/2020, 12:27 AM

## 2020-11-15 NOTE — Progress Notes (Signed)
ANTICOAGULATION CONSULT NOTE - Initial Consult  Pharmacy Consult for Heparin  Indication: chest pain/ACS  No Known Allergies  Patient Measurements: Height: 6\' 1"  (185.4 cm) Weight: 79 kg (174 lb 2.6 oz) IBW/kg (Calculated) : 79.9  Vital Signs: Temp: 96.8 F (36 C) (12/22 0500) Temp Source: Esophageal (12/22 0338) BP: 169/88 (12/22 0500) Pulse Rate: 114 (12/22 0500)  Labs: Recent Labs    11/13/20 0419 11/14/20 0803 11/15/20 0113 11/15/20 0345  HGB 8.0*  --  6.8* 7.4*  HCT 25.6*  --  20.0* 22.9*  PLT 453*  --   --  382  APTT  --   --   --  32  LABPROT  --   --   --  14.9  INR  --   --   --  1.2  CREATININE 1.69* 1.55*  --  1.64*  TROPONINIHS  --   --   --  1,493*    Estimated Creatinine Clearance: 48.8 mL/min (A) (by C-G formula based on SCr of 1.64 mg/dL (H)).   Medical History: Past Medical History:  Diagnosis Date  . Diabetes mellitus without complication (HCC)   . Elevated random blood glucose level   . Foley catheter in place   . Hyperlipidemia   . Hypertension   . Schizophrenia [facility])     Assessment: 69 y/o M s/p cardiac arrest with ROSC, now with elevated troponin, starting heparin for now, Hgb 7.4 (baseline this admit has been low). Noted bump in Scr.   Goal of Therapy:  Heparin level 0.3-0.7 units/ml Monitor platelets by anticoagulation protocol: Yes   Plan:  DC subcutaneous heparin No bolus with 20 minutes of chest compressions Start heparin drip at 1000 units/hr Heparin level at 1400 Daily CBC/HL Monitor for bleeding  79, PharmD, BCPS Clinical Pharmacist Phone: [phone]

## 2020-11-15 NOTE — Progress Notes (Signed)
Patient heard gasping for air by charge nurse. Charge nurse assessed patient to find patient unresponsive with no pulse. Code blue initiated. Code team arrived. ROSC achieved. Patient transferred to 4M. Family notified by MD.

## 2020-11-15 NOTE — Progress Notes (Signed)
   11/15/20 0000  Clinical Encounter Type  Visited With Patient not available  Visit Type Code  Referral From Nurse  Consult/Referral To Chaplain  The chaplain responded to code blue. The patient will be moved to 2M09. No family present. Chaplain will follow up as needed.

## 2020-11-15 NOTE — Progress Notes (Signed)
Initial Nutrition Assessment  DOCUMENTATION CODES:   Not applicable  INTERVENTION:   Initiate tube feeding via OG tube: Vital AF 1.2 at 70 ml/h (1680 ml per day)  Provides 2016 kcal, 126 gm protein, 1362 ml free water daily  NUTRITION DIAGNOSIS:   Inadequate oral intake related to inability to eat as evidenced by NPO status.  GOAL:   Patient will meet greater than or equal to 90% of their needs  MONITOR:   Vent status,TF tolerance,Labs,Skin  REASON FOR ASSESSMENT:   Ventilator,Consult Enteral/tube feeding initiation and management  ASSESSMENT:   69 yo male admitted with obstructed uropathy, acute renal insufficiency, UTI. Intubated and transferred to the ICU early 12/22 s/p V fib cardiac arrest. PMH includes DM, HLD, HTN, schizophrenia, [name].   Received MD Consult for TF initiation and management. OG tube in place. Per chart review, patient has been eating poorly since admission. Meal intakes documented at 10-25%.  Patient is currently intubated on ventilator support MV: 13 L/min Temp (24hrs), Avg:96.6 F (35.9 C), Min:77.36 F (25.2 C), Max:99.6 F (37.6 C)   Labs reviewed.  CBG: 164-180  Medications reviewed and include novolog, remeron, precedex.  Recent weights reviewed. Weight is down by 9% over the past 6 months. Weight in July (after amputation in June) was 86.6 kg, now down to 79 kg.  Diet Order:   Diet Order            Diet NPO time specified  Diet effective now                 EDUCATION NEEDS:   No education needs have been identified at this time  Skin:  Skin Assessment: Reviewed RN Assessment  Last BM:  12/20  Height:   Ht Readings from Last 1 Encounters:  11/09/20 6\' 1"  (1.854 m)    Weight:   Wt Readings from Last 1 Encounters:  11/15/20 79 kg    Ideal Body Weight:  78.3 kg  BMI:  24.6 (adjusted for BKA)  Estimated Nutritional Needs:   Kcal:  2030  Protein:  120-135 gm  Fluid:  >/= 2 L    2031, RD,  LDN, CNSC Please refer to Amion for contact information.

## 2020-11-15 NOTE — Progress Notes (Signed)
eLink Physician-Brief Progress Note Patient Name: Ricky Hanson DOB: Dec 09, 1950 MRN: [id number]   Date of Service  11/15/2020  HPI/Events of Note  Anemia -  Hgb = 6.8.   eICU Interventions  Will transfuse 1 unit PRVC now.      Intervention Category Major Interventions: Other:  [name] 11/15/2020, 2:54 AM

## 2020-11-15 NOTE — Progress Notes (Signed)
eLink Physician-Brief Progress Note Patient Name: Ricky Hanson DOB: Aug 05, 1951 MRN: [id number]   Date of Service  11/15/2020  HPI/Events of Note  Repeat Hgb = 7.4.   eICU Interventions  Plan: 1. D/C PRBC transfusion.      Intervention Category Major Interventions: Other:  [name] 11/15/2020, 5:04 AM

## 2020-11-15 NOTE — Progress Notes (Signed)
LTM EEG hooked up and running - no initial skin breakdown - push button tested - neuro notified.Same leads used [facility] to monitor

## 2020-11-15 NOTE — Progress Notes (Signed)
Sputum sample obtained and sent to main lab without complications. 

## 2020-11-15 NOTE — Code Documentation (Signed)
  Patient Name: Ricky Hanson   MRN: [id number]   Date of Birth/ Sex: October 13, 1951 , male      Admission Date: 11/08/2020  Attending Provider: Joseph Art, DO  Primary Diagnosis: ARF (acute renal failure) [facility])   Indication: Pt was in his usual state of health until this AM, when he was noted to be pulseless in V tach. Code blue was subsequently called. At the time of arrival on scene, ACLS protocol was underway.   Technical Description:  - CPR performance duration:  20 minutes  - Was defibrillation or cardioversion used? Yes   - Was external pacer placed? No  - Was patient intubated pre/post CPR? Yes   Medications Administered: Y = Yes; Blank = No Amiodarone    Atropine    Calcium    Epinephrine  Y x4  Lidocaine    Magnesium    Norepinephrine    Phenylephrine    Sodium bicarbonate  Y  Vasopressin     Post CPR evaluation:  - Final Status - Was patient successfully resuscitated ? Yes - What is current rhythm? Sinus tach - What is current hemodynamic status? hypertensive  Miscellaneous Information:  - Labs sent, including: N/A  - Primary team notified?  Yes  - Family Notified? Yes - cousin Ricky Hanson  - Additional notes/ transfer status: Transferred to ICU     Remo Lipps, MD  11/15/2020, 12:18 AM

## 2020-11-16 DIAGNOSIS — N179 Acute kidney failure, unspecified: Secondary | ICD-10-CM | POA: Diagnosis not present

## 2020-11-16 DIAGNOSIS — I469 Cardiac arrest, cause unspecified: Secondary | ICD-10-CM | POA: Diagnosis not present

## 2020-11-16 DIAGNOSIS — N183 Chronic kidney disease, stage 3 unspecified: Secondary | ICD-10-CM | POA: Diagnosis not present

## 2020-11-16 LAB — CBC
HCT: 19.2 % — ABNORMAL LOW (ref 39.0–52.0)
HCT: 23 % — ABNORMAL LOW (ref 39.0–52.0)
Hemoglobin: 6.3 g/dL — CL (ref 13.0–17.0)
Hemoglobin: 7.6 g/dL — ABNORMAL LOW (ref 13.0–17.0)
MCH: 25.6 pg — ABNORMAL LOW (ref 26.0–34.0)
MCH: 26.5 pg (ref 26.0–34.0)
MCHC: 32.8 g/dL (ref 30.0–36.0)
MCHC: 33 g/dL (ref 30.0–36.0)
MCV: 78 fL — ABNORMAL LOW (ref 80.0–100.0)
MCV: 80.1 fL (ref 80.0–100.0)
Platelets: 270 10*3/uL (ref 150–400)
Platelets: 260 10*3/uL (ref 150–400)
RBC: 2.46 MIL/uL — ABNORMAL LOW (ref 4.22–5.81)
RBC: 2.87 MIL/uL — ABNORMAL LOW (ref 4.22–5.81)
RDW: 15.3 % (ref 11.5–15.5)
RDW: 15.2 % (ref 11.5–15.5)
WBC: 19.3 10*3/uL — ABNORMAL HIGH (ref 4.0–10.5)
WBC: 17.8 10*3/uL — ABNORMAL HIGH (ref 4.0–10.5)
nRBC: 0 % (ref 0.0–0.2)
nRBC: 0 % (ref 0.0–0.2)

## 2020-11-16 LAB — PREPARE RBC (CROSSMATCH)

## 2020-11-16 LAB — BASIC METABOLIC PANEL
Anion gap: 10 (ref 5–15)
BUN: 22 mg/dL (ref 8–23)
CO2: 16 mmol/L — ABNORMAL LOW (ref 22–32)
Calcium: 8 mg/dL — ABNORMAL LOW (ref 8.9–10.3)
Chloride: 110 mmol/L (ref 98–111)
Creatinine, Ser: 1.59 mg/dL — ABNORMAL HIGH (ref 0.61–1.24)
GFR, Estimated: 47 mL/min — ABNORMAL LOW (ref >60)
Glucose, Bld: 154 mg/dL — ABNORMAL HIGH (ref 70–99)
Potassium: 3.7 mmol/L (ref 3.5–5.1)
Sodium: 136 mmol/L (ref 135–145)

## 2020-11-16 LAB — GLUCOSE, CAPILLARY
Glucose-Capillary: 143 mg/dL — ABNORMAL HIGH (ref 70–99)
Glucose-Capillary: 206 mg/dL — ABNORMAL HIGH (ref 70–99)
Glucose-Capillary: 173 mg/dL — ABNORMAL HIGH (ref 70–99)
Glucose-Capillary: 133 mg/dL — ABNORMAL HIGH (ref 70–99)
Glucose-Capillary: 151 mg/dL — ABNORMAL HIGH (ref 70–99)
Glucose-Capillary: 145 mg/dL — ABNORMAL HIGH (ref 70–99)

## 2020-11-16 LAB — PHOSPHORUS
Phosphorus: 2 mg/dL — ABNORMAL LOW (ref 2.5–4.6)
Phosphorus: 3.3 mg/dL (ref 2.5–4.6)

## 2020-11-16 LAB — MAGNESIUM
Magnesium: 2.3 mg/dL (ref 1.7–2.4)
Magnesium: 2.3 mg/dL (ref 1.7–2.4)

## 2020-11-16 MED ORDER — SODIUM CHLORIDE 0.9% IV SOLUTION: Freq: Once | INTRAVENOUS | Status: DC

## 2020-11-16 MED ORDER — POTASSIUM PHOSPHATES 15 MMOLE/5ML IV SOLN
15.0000 mmol | Freq: Once | INTRAVENOUS | Status: AC
  Administered 2020-11-16: 09:00:00 15 mmol via INTRAVENOUS
  Filled 2020-11-16: qty 5

## 2020-11-16 MED ORDER — NOREPINEPHRINE 4 MG/250ML-% IV SOLN
0.0000 ug/min | INTRAVENOUS | Status: DC
  Administered 2020-11-16: 20:00:00 1 ug/min via INTRAVENOUS
  Administered 2020-11-16: 12:00:00 1.5 ug/min via INTRAVENOUS
  Filled 2020-11-16 (×2): qty 250

## 2020-11-16 MED ORDER — LEVETIRACETAM IN NACL 500 MG/100ML IV SOLN
500.0000 mg | Freq: Two times a day (BID) | INTRAVENOUS | Status: AC
  Administered 2020-11-16 – 2020-11-30 (×29): 500 mg via INTRAVENOUS
  Filled 2020-11-16 (×32): qty 100

## 2020-11-16 MED ORDER — LORAZEPAM 2 MG/ML IJ SOLN: 2.0000 mg | INTRAMUSCULAR | Status: DC | PRN

## 2020-11-16 NOTE — Progress Notes (Signed)
PT Cancellation Note  Patient Details Name: Ricky Hanson MRN: [id number] DOB: [birth year]   Cancelled Treatment:    Reason Eval/Treat Not Completed: Patient not medically ready (pt with medical decline since evaluation with code and intubation 12/22 and currently Hgb of 6.3)   [name] 11/16/2020, 6:46 AM  Merryl Hacker, PT Acute Rehabilitation Services Pager: [phone] Office: [phone]

## 2020-11-16 NOTE — Progress Notes (Signed)
eLink Physician-Brief Progress Note Patient Name: Ricky Hanson DOB: [birth year] MRN: [id number]   Date of Service  11/16/2020  HPI/Events of Note  Patient has frequent watery stools, bedside RN requesting Flexiseal.  eICU Interventions  Flexiseal ordered.        Thomasene Lot [name] 11/16/2020, 10:48 PM

## 2020-11-16 NOTE — Progress Notes (Signed)
eLink Physician-Brief Progress Note Patient Name: Ricky Hanson DOB: 12-Aug-1951 MRN: [id number]   Date of Service  11/16/2020  HPI/Events of Note  Hg < 7. Up and down. No hx of GI bleeding.  eICU Interventions  1 PRBC over 4 hrs and follow Hg/Hct post     Intervention Category Intermediate Interventions: Other:;Diagnostic test evaluation  Ranee Gosselin 11/16/2020, 4:58 AM

## 2020-11-16 NOTE — Progress Notes (Signed)
Attempted to contact patient's son at number listed [phone], for blood consent. Unable to reach son, message left requesting return call.

## 2020-11-16 NOTE — Plan of Care (Signed)
  Problem: Clinical Measurements: Goal: Complications related to the disease process or treatment will be avoided or minimized Outcome: Progressing   Problem: Activity: Goal: Activity intolerance will improve Outcome: Progressing   Problem: Fluid Volume: Goal: Fluid volume balance will be maintained or improved Outcome: Progressing   Problem: Nutritional: Goal: Ability to make appropriate dietary choices will improve Outcome: Progressing   Problem: Respiratory: Goal: Respiratory symptoms related to disease process will be avoided Outcome: Progressing   Problem: Urinary Elimination: Goal: Progression of disease will be identified and treated Outcome: Progressing   Problem: Education: Goal: Ability to demonstrate management of disease process will improve Outcome: Progressing Goal: Ability to verbalize understanding of medication therapies will improve Outcome: Progressing   Problem: Activity: Goal: Capacity to carry out activities will improve Outcome: Progressing   Problem: Cardiac: Goal: Ability to achieve and maintain adequate cardiopulmonary perfusion will improve Outcome: Progressing   Problem: Clinical Measurements: Goal: Ability to maintain clinical measurements within normal limits will improve Outcome: Progressing Goal: Will remain free from infection Outcome: Progressing Goal: Diagnostic test results will improve Outcome: Progressing Goal: Respiratory complications will improve Outcome: Progressing Goal: Cardiovascular complication will be avoided Outcome: Progressing   Problem: Activity: Goal: Risk for activity intolerance will decrease Outcome: Progressing   Problem: Nutrition: Goal: Adequate nutrition will be maintained Outcome: Progressing   Problem: Coping: Goal: Level of anxiety will decrease Outcome: Progressing   Problem: Elimination: Goal: Will not experience complications related to bowel motility Outcome: Progressing Goal: Will not  experience complications related to urinary retention Outcome: Progressing   Problem: Pain Managment: Goal: General experience of comfort will improve Outcome: Progressing   Problem: Safety: Goal: Ability to remain free from injury will improve Outcome: Progressing   Problem: Skin Integrity: Goal: Risk for impaired skin integrity will decrease Outcome: Progressing   Problem: Activity: Goal: Ability to tolerate increased activity will improve Outcome: Progressing   Problem: Respiratory: Goal: Ability to maintain a clear airway and adequate ventilation will improve Outcome: Progressing   Problem: Role Relationship: Goal: Method of communication will improve Outcome: Progressing   Problem: Education: Goal: Ability to manage disease process will improve Outcome: Progressing   Problem: Cardiac: Goal: Ability to achieve and maintain adequate cardiopulmonary perfusion will improve Outcome: Progressing   Problem: Neurologic: Goal: Promote progressive neurologic recovery Outcome: Progressing   Problem: Skin Integrity: Goal: Risk for impaired skin integrity will be minimized. Outcome: Progressing

## 2020-11-16 NOTE — Progress Notes (Signed)
CRITICAL VALUE ALERT  Critical Value:  hgb 6.3  Date & Time Notified:  12/23  0445  Provider Notified: Pola Corn  Orders Received/Actions taken: see new orders

## 2020-11-16 NOTE — Progress Notes (Signed)
Attempted to contact pt's son Loura Halt for blood consent this morning, unable to reach at this time.

## 2020-11-16 NOTE — Progress Notes (Signed)
LTM maint complete - no skin breakdown under: FP1,FP2 no skin breakdown.

## 2020-11-16 NOTE — Procedures (Addendum)
Patient Name: Ricky Hanson  MRN: [id number]  Epilepsy Attending: Charlsie Quest  Referring Physician/Provider: Dr. Glyn Ade Duration: 11/15/2020 8338 to 11/16/2020 0953  Patient history: 69 year old man status post cardiac arrest.  EEG to evaluate for seizures.  Level of alertness: Awake  AEDs during EEG study: None  Technical aspects: This EEG study was done with scalp electrodes positioned according to the 10-20 International system of electrode placement. Electrical activity was acquired at a sampling rate of 500Hz  and reviewed with a high frequency filter of 70Hz  and a low frequency filter of 1Hz . EEG data were recorded continuously and digitally stored.   Description: No clear posterior dominant rhythm was seen.  EEG showed continuous generalized 3 to 5 Hz theta-delta slowing.  Generalized periodic epileptiform discharges with triphasic morphology were also noted at 0.25 Hz in the beginning and gradually worsened to 1 Hz after about 1900 on 11/15/2020.    These discharges were intermittent and seen more frequently when patient was awake/stimulated.  Hyperventilation and photic stimulation were not performed.     ABNORMALITY -Continuous slow, generalized -Periodic epileptiform discharges with triphasic morphology, generalized  IMPRESSION: This study showed generalized periodic epileptiform discharges with triphasic morphology which are on the ictal-interictal continuum.  The morphology of the discharges as well as the increase in frequency when patient was awake/stimulated suggest that they are more likely interictal nature.  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  Shakiah Wester 

## 2020-11-16 NOTE — Progress Notes (Signed)
Pharmacy Electrolyte Replacement  Recent Labs:  Recent Labs    11/16/20 0410  K 3.7  MG 2.3  PHOS 2.0*  CREATININE 1.59*    Low Critical Values (K </= 2.5, Phos </= 1, Mg </= 1) Present: None  Plan:  KPhos 15 mmol x 1  Elmer Sow, PharmD, BCPS, BCCCP Clinical Pharmacist [phone]  Please check AMION for all [facility] Pharmacy numbers  11/16/2020 7:40 AM

## 2020-11-16 NOTE — Progress Notes (Signed)
NAME:  Ricky Hanson, MRN:  [id number], DOB:  [birth year], LOS: 7 ADMISSION DATE:  11/08/2020, CONSULTATION DATE: 12/22 REFERRING MD: Dr. Zenaida Niece, CHIEF COMPLAINT: Cardiac arrest  Brief History:  69 year old male status post cardiac arrest  History of Present Illness:  This is a 69 year old black male that presented originally with obstructive uropathy and acute renal insufficiency.  He had been diagnosed during the hospital stay with a urinary tract infection.  He developed ventricular fibrillation and cardiac arrest.  Patient was pulseless for approximately 20 minutes.  Patient was intubated during this event.  Past Medical History:  Diabetes mellitus Hyperlipidemia Hypertension Schizophrenia  Significant Hospital Events:  Cardiac arrest  Consults:  Neurology  Procedures:  Triple-lumen catheter 12/22 Arterial line 12/22  Significant Diagnostic Tests:  12/22 CXR - ETT in place, L subclavian CL in place. Bilateral patchy opacity at lung bases. Edema vs infection. 12/22 CT Head w/o C - No changes from imaging 6 days prior. Chronic ischemic insults EEG 12/22>>> This study is suggestive of severe diffuse encephalopathy, nonspecific etiology. No seizures or epileptiform discharges were seen throughout the recording. 2D echo 12/22>>> EF 55-60%, grade 1 diastolic dysfunction, normal RV, small pericardial effusion, no valvular abnormalities   Micro Data:  Covid negative Influenza negative Urine 12/15>> Providencia Sputum 12/22>>> few GNR, rare GPC>>>  Antimicrobials:  12/15-12/21 Cefepime completed 7 day course Zosyn 12/22 >>>  Interim history/subjective:  Anemic this am, hgb 6.3. No obvious s/s bleeding.   Objective   Blood pressure 123/64, pulse 70, temperature 98.6 F (37 C), resp. rate (!) 28, height 6\' 1"  (1.854 m), weight 79.2 kg, SpO2 100 %.    Vent Mode: PRVC FiO2 (%):  [40 %] 40 % Set Rate:  [18 bmp-28 bmp] 18 bmp Vt Set:  [480 mL] 480 mL PEEP:  [5 cmH20] 5  cmH20 Plateau Pressure:  [17 cmH20-19 cmH20] 17 cmH20   Intake/Output Summary (Last 24 hours) at 11/16/2020 0906 Last data filed at 11/16/2020 0800 Gross per 24 hour  Intake 3968.01 ml  Output 615 ml  Net 3353.01 ml   Filed Weights   11/14/20 0450 11/15/20 0130 11/16/20 0500  Weight: 77.5 kg 79 kg 79.2 kg    Examination: General: Ill appearing HENT: mm moist, ETT< no JVD  Lungs: resps even non labored on vent, diminished bases otherwise clear  Cardiovascular: Regular rate Abdomen: Soft nondistended. BS + Extremities: Contracture of right wrist, [name]  Neuro: sedated, RASS -2, per RN was awake, eyes open sitting up in bed but not following commands  Assessment & Plan:  Acute respiratory failure after VT/VF arrest - ~10mins downtime  PLAN -  Vent support - 8cc/kg  F/u CXR  F/u ABG SBT when appropriate   VT/VF arrest  Shock  PLAN -  Echo as above  Trend troponin  Wean levophed as able - low dose currently  Normothermia   Acute renal insufficiency on CKD stage III - baseline Scr ~1.8 PLAN -  F/u chem  Monitor uop   Leukocytosis - improving.   Patient with elevated WBC of 36.4 on 12/22. Prior of 14.5 Afebrile.  PLAN -  Continue empiric abx  Trend wbc  Follow cultures    Encephalopathy - post arrest.  Hx schizophrenia  PLAN -  EEG  F/u MRI 12/24 PAD protocol for sedation  RASS goal of -1 to -2 Normothermia   Anemia - Hgb 6.8->7.8->6.3 No obvious s/s bleeding  PLAN -  1 unit PRBC  F/u CBC this afternoon and  in am  Hold SQ heparin for now Monitor for s/s bleeding   DM PLAN -  SSI    Best practice (evaluated daily)  Diet: TF Pain/Anxiety/Delirium protocol (if indicated): Fentanyl, Precedex VAP protocol (if indicated): Initiated DVT prophylaxis: Lovenox GI prophylaxis: Protonix Glucose control: Insulin sliding scale Goals of Care: RN updated family 12/23 - difficult to contact  Mobility: Bedrest Disposition: ICU  Goals of Care:   Code  Status: Full  Labs   CBC: Recent Labs  Lab 11/10/20 0319 11/11/20 0342 11/13/20 0419 11/15/20 0113 11/15/20 0345 11/15/20 0946 11/16/20 0410  WBC 14.8* 14.4* 14.5*  --  36.4*  --  19.3*  HGB 7.9* 7.9* 8.0* 6.8* 7.4* 7.8* 6.3*  HCT 22.8* 24.2* 25.6* 20.0* 22.9* 23.0* 19.2*  MCV 76.5* 78.6* 82.1  --  80.9  --  78.0*  PLT 506* 468* 453*  --  382  --  270    Basic Metabolic Panel: Recent Labs  Lab 11/13/20 0419 11/14/20 0803 11/15/20 0113 11/15/20 0345 11/15/20 0946 11/15/20 1008 11/15/20 1444 11/15/20 1703 11/16/20 0410  NA 138 138 140 138 139  --  137  --  136  K 4.3 4.1 3.4* 3.9 4.1  --  4.2  --  3.7  CL 108 109  --  111  --   --  112*  --  110  CO2 21* 18*  --  15*  --   --  18*  --  16*  GLUCOSE 120* 111*  --  197*  --   --  124*  --  154*  BUN 21 18  --  16  --   --  21  --  22  CREATININE 1.69* 1.55*  --  1.64*  --   --  1.69*  --  1.59*  CALCIUM 8.9 9.0  --  8.2*  --   --  8.2*  --  8.0*  MG  --   --   --  1.7  --  2.6*  --  2.4 2.3  PHOS  --   --   --   --   --  2.6  --  2.4* 2.0*   GFR: Estimated Creatinine Clearance: 50.5 mL/min (A) (by C-G formula based on SCr of 1.59 mg/dL (H)). Recent Labs  Lab 11/11/20 0342 11/13/20 0419 11/15/20 0345 11/16/20 0410  WBC 14.4* 14.5* 36.4* 19.3*  LATICACIDVEN  --   --  1.3  --     Liver Function Tests: Recent Labs  Lab 11/13/20 0419 11/15/20 0345  AST 12* 195*  ALT 13 173*  ALKPHOS 84 91  BILITOT 0.7 1.0  PROT 6.5 6.1*  ALBUMIN 2.3* 2.2*   No results for input(s): LIPASE, AMYLASE in the last 168 hours. No results for input(s): AMMONIA in the last 168 hours.  ABG    Component Value Date/Time   PHART 7.417 11/15/2020 0946   PCO2ART 27.6 (L) 11/15/2020 0946   PO2ART 81 (L) 11/15/2020 0946   HCO3 18.0 (L) 11/15/2020 0946   TCO2 19 (L) 11/15/2020 0946   ACIDBASEDEF 6.0 (H) 11/15/2020 0946   O2SAT 97.0 11/15/2020 0946     Coagulation Profile: Recent Labs  Lab 11/15/20 0345  INR 1.2    Cardiac  Enzymes: No results for input(s): CKTOTAL, CKMB, CKMBINDEX, TROPONINI in the last 168 hours.  HbA1C: Hgb A1c MFr Bld  Date/Time Value Ref Range Status  11/08/2020 09:39 PM 5.7 (H) 4.8 - 5.6 % Final    Comment:    (  NOTE) Pre diabetes:          5.7%-6.4%  Diabetes:              >6.4%  Glycemic control for   <7.0% adults with diabetes   02/02/2020 02:27 PM 5.5 4.8 - 5.6 % Final    Comment:             Prediabetes: 5.7 - 6.4          Diabetes: >6.4          Glycemic control for adults with diabetes: <7.0     CBG: Recent Labs  Lab 11/15/20 1526 11/15/20 1923 11/15/20 2313 11/16/20 0316 11/16/20 0740  GLUCAP 114* 132* 154* 143* 206*     CC time 35 mins    Dirk Dress, NP Pulmonary/Critical Care Medicine  11/16/2020  9:06 AM

## 2020-11-16 NOTE — Consult Note (Signed)
Neurology Consultation Reason for Consult: cardiac arrest, seizure Referring Physician: Dr Cheri Fowler  CC: cardiac arrest  History is obtained from:chart review as pt intubated  HPI: Nijee Heatwole is a 69 y.o. 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 On 11/15/2020, he developed ventricular fibrillation and cardiac arrest.  Patient was pulseless for approximately 20 minutes. TTM was initiated and eeg was ordered as part of TTM protocol. Today eeg showed generalized periodic epileptiform discharges with triphasic morphology which are on the ictal-interictal continuum. Therefore, neurology ws consulted. No clinical seizures, jerks noted by team yet.   ROS: Unable to obtain due to altered mental status.   Past Medical History:  Diagnosis Date  . Diabetes mellitus without complication (HCC)   . Elevated random blood glucose level   . Foley catheter in place   . Hyperlipidemia   . Hypertension   . Schizophrenia (HCC)    History reviewed. Unable to obtain due to intubation   Social History: per chart review, reports that he has been smoking cigarettes. He has been smoking about 0.50 packs per day. He has never used smokeless tobacco. He reports that he does not drink alcohol and does not use drugs.  Exam: Current vital signs: BP (!) 96/59   Pulse (!) 55   Temp 98.06 F (36.7 C) (Rectal)   Resp (!) 28   Ht 6\' 1"  (1.854 m)   Wt 79.2 kg   SpO2 100%   BMI 23.04 kg/m  Vital signs in last 24 hours: Temp:  [95.72 F (35.4 C)-99.32 F (37.4 C)] 98.06 F (36.7 C) (12/23 1800) Pulse Rate:  [50-73] 55 (12/23 1800) Resp:  [24-28] 28 (12/23 1800) BP: (89-152)/(54-99) 96/59 (12/23 1800) SpO2:  [100 %] 100 % (12/23 1800) Arterial Line BP: (84-152)/(37-113) 121/101 (12/23 1100) FiO2 (%):  [40 %] 40 % (12/23  1630) Weight:  [79.2 kg] 79.2 kg (12/23 0500)   Physical Exam  Constitutional: intubated, laying in bed Psych: unable to assess due to intubation Eyes: No scleral injection HENT: No OP obstrucion Head: Normocephalic.  Cardiovascular: Normal rate and regular rhythm.  Respiratory: intubated, coarse breath sounds BL GI: Soft.  No distension Skin: Warm Neuro: opens eyes to noxious stimuli, doesn't follow commands, PERLA, oculocephalic intact, gag intact, antigravity in both upper extremities with semi-purposeful movements on noxious stimulation, doesn't withdraw left lower extremity, RLE above knee amputation  I have reviewed labs in epic and the results pertinent to this consultation are:  Recent Labs  Lab 11/13/20 0419 11/14/20 0803 11/15/20 0113 11/15/20 0345 11/15/20 0946 11/15/20 1008 11/15/20 1444 11/15/20 1703 11/16/20 0410 11/16/20 1707  NA 138 138 140 138 139  --  137  --  136  --   K 4.3 4.1 3.4* 3.9 4.1  --  4.2  --  3.7  --   CL 108 109  --  111  --   --  112*  --  110  --   CO2 21* 18*  --  15*  --   --  18*  --  16*  --   GLUCOSE 120* 111*  --  197*  --   --  124*  --  154*  --   BUN 21 18  --  16  --   --  21  --  22  --   CREATININE 1.69* 1.55*  --  1.64*  --   --  1.69*  --  1.59*  --   CALCIUM 8.9 9.0  --  8.2*  --   --  8.2*  --  8.0*  --   MG  --   --   --  1.7  --  2.6*  --  2.4 2.3 2.3  PHOS  --   --   --   --   --  2.6  --  2.4* 2.0* 3.3    CBC: Recent Labs  Lab 11/11/20 0342 11/13/20 0419 11/15/20 0113 11/15/20 0345 11/15/20 0946 11/16/20 0410 11/16/20 1329  WBC 14.4* 14.5*  --  36.4*  --  19.3* 17.8*  HGB 7.9* 8.0* 6.8* 7.4* 7.8* 6.3* 7.6*  HCT 24.2* 25.6* 20.0* 22.9* 23.0* 19.2* 23.0*  MCV 78.6* 82.1  --  80.9  --  78.0* 80.1  PLT 468* 453*  --  382  --  270 260     Coagulation Studies: Recent Labs    11/15/20 0345  LABPROT 14.9  INR 1.2    I have reviewed the images obtained: CTH wo contrast 11/16/2020: Scattered chronic  appearing cortically based infarcts along the left frontal and occipital convexities. Chronic small vessel ischemic gliosis in the cerebral white matter with chronic lacune at the left caudate and right pons.  ASSESSMENT/PLAN: 69yo M s/p in hospital cardiac arrest on 11/15/2020 , ROSC after 20 mins.   Cardiac arrest Leucocytosis Microcytic anemia CKD - EEG showed generalized periodic epileptiform discharges with triphasic morphology which are on the ictal-interictal continuum.  The morphology of the discharges as well as the increase in frequency when patient was awake/stimulated suggest that they are more likely interictal nature.  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  Recommendations: - Patient was started on LEV 500mg  BID by critical care team. I agree with starting AED given the ictal-interictal nature of eeg.  - If patient has any clinical seizures or worsening of eeg, we can increase keppra to 1000mg  BID - MRI brain wo contrats at 72 hours ( 11/18/2020) - Will dc ltm if no seizures, no change in eeg pattern tomorrow - seizure precautions - PRN IV ativan 2mg  for clinical sz - management of rest of comorbidities per primary team   CRITICAL CARE Performed by:   Total critical care time: 40 minutes  Critical care time was exclusive of separately billable procedures and treating other patients.  Critical care was necessary to treat or prevent imminent or life-threatening deterioration.  Critical care was time spent personally by me on the following activities: development of treatment plan with patient and/or surrogate as well as nursing, discussions with consultants, evaluation of patient's response to treatment, examination of patient, obtaining history from patient or surrogate, ordering and performing treatments and interventions, ordering and review of laboratory studies, ordering and  review of radiographic studies, pulse oximetry and re-evaluation of patient's condition.   11/20/2020 [facility]

## 2020-11-17 ENCOUNTER — Inpatient Hospital Stay (HOSPITAL_COMMUNITY): Payer: Medicare Other

## 2020-11-17 DIAGNOSIS — N390 Urinary tract infection, site not specified: Secondary | ICD-10-CM | POA: Diagnosis not present

## 2020-11-17 DIAGNOSIS — N179 Acute kidney failure, unspecified: Secondary | ICD-10-CM | POA: Diagnosis not present

## 2020-11-17 DIAGNOSIS — J9601 Acute respiratory failure with hypoxia: Secondary | ICD-10-CM

## 2020-11-17 DIAGNOSIS — I469 Cardiac arrest, cause unspecified: Secondary | ICD-10-CM | POA: Diagnosis not present

## 2020-11-17 LAB — BASIC METABOLIC PANEL
Anion gap: 7 (ref 5–15)
BUN: 30 mg/dL — ABNORMAL HIGH (ref 8–23)
CO2: 16 mmol/L — ABNORMAL LOW (ref 22–32)
Calcium: 7.7 mg/dL — ABNORMAL LOW (ref 8.9–10.3)
Chloride: 115 mmol/L — ABNORMAL HIGH (ref 98–111)
Creatinine, Ser: 1.68 mg/dL — ABNORMAL HIGH (ref 0.61–1.24)
GFR, Estimated: 44 mL/min — ABNORMAL LOW (ref >60)
Glucose, Bld: 149 mg/dL — ABNORMAL HIGH (ref 70–99)
Potassium: 3.7 mmol/L (ref 3.5–5.1)
Sodium: 138 mmol/L (ref 135–145)

## 2020-11-17 LAB — GLUCOSE, CAPILLARY
Glucose-Capillary: 130 mg/dL — ABNORMAL HIGH (ref 70–99)
Glucose-Capillary: 172 mg/dL — ABNORMAL HIGH (ref 70–99)
Glucose-Capillary: 148 mg/dL — ABNORMAL HIGH (ref 70–99)
Glucose-Capillary: 135 mg/dL — ABNORMAL HIGH (ref 70–99)
Glucose-Capillary: 108 mg/dL — ABNORMAL HIGH (ref 70–99)

## 2020-11-17 LAB — CBC
HCT: 23.1 % — ABNORMAL LOW (ref 39.0–52.0)
Hemoglobin: 7.4 g/dL — ABNORMAL LOW (ref 13.0–17.0)
MCH: 26 pg (ref 26.0–34.0)
MCHC: 32 g/dL (ref 30.0–36.0)
MCV: 81.1 fL (ref 80.0–100.0)
Platelets: 251 10*3/uL (ref 150–400)
RBC: 2.85 MIL/uL — ABNORMAL LOW (ref 4.22–5.81)
RDW: 15.4 % (ref 11.5–15.5)
WBC: 17.8 10*3/uL — ABNORMAL HIGH (ref 4.0–10.5)
nRBC: 0.1 % (ref 0.0–0.2)

## 2020-11-17 LAB — CULTURE, RESPIRATORY W GRAM STAIN: Culture: NORMAL

## 2020-11-17 LAB — TYPE AND SCREEN
ABO/RH(D): O POS
Antibody Screen: NEGATIVE
Unit division: 0

## 2020-11-17 LAB — BPAM RBC
Blood Product Expiration Date: 202201232359
ISSUE DATE / TIME: 202112230843
Unit Type and Rh: 5100

## 2020-11-17 LAB — LACTIC ACID, PLASMA: Lactic Acid, Venous: 2.7 mmol/L (ref 0.5–1.9)

## 2020-11-17 MED ORDER — ATORVASTATIN CALCIUM 40 MG PO TABS
80.0000 mg | ORAL_TABLET | Freq: Every day | ORAL | Status: DC
  Administered 2020-11-17 – 2020-11-18 (×2): 80 mg
  Filled 2020-11-17 (×2): qty 2

## 2020-11-17 MED ORDER — LACTATED RINGERS IV BOLUS
1000.0000 mL | Freq: Once | INTRAVENOUS | Status: AC
  Administered 2020-11-17: 18:00:00 1000 mL via INTRAVENOUS

## 2020-11-17 MED ORDER — FENTANYL 2500MCG IN NS 250ML (10MCG/ML) PREMIX INFUSION
25.0000 ug/h | INTRAVENOUS | Status: DC
  Administered 2020-11-17: 21:00:00 25 ug/h via INTRAVENOUS
  Administered 2020-11-18: 09:00:00 200 ug/h via INTRAVENOUS
  Filled 2020-11-17 (×2): qty 250

## 2020-11-17 MED ORDER — FENTANYL CITRATE (PF) 100 MCG/2ML IJ SOLN: 25.0000 ug | INTRAMUSCULAR | Status: DC | PRN

## 2020-11-17 MED ORDER — FENTANYL CITRATE (PF) 100 MCG/2ML IJ SOLN
25.0000 ug | INTRAMUSCULAR | Status: DC | PRN
  Administered 2020-11-17: 100 ug via INTRAVENOUS
  Filled 2020-11-17 (×3): qty 2

## 2020-11-17 MED ORDER — FENTANYL BOLUS VIA INFUSION
25.0000 ug | INTRAVENOUS | Status: DC | PRN
  Administered 2020-11-17 – 2020-11-18 (×8): 25 ug via INTRAVENOUS
  Filled 2020-11-17: qty 25

## 2020-11-17 MED ORDER — LACTATED RINGERS IV BOLUS
1000.0000 mL | Freq: Once | INTRAVENOUS | Status: AC
  Administered 2020-11-17: 10:00:00 1000 mL via INTRAVENOUS

## 2020-11-17 NOTE — Progress Notes (Signed)
Pt transported to MRI with no issues.

## 2020-11-17 NOTE — Plan of Care (Signed)
  Problem: Education: Goal: Knowledge of disease and its progression will improve Outcome: Progressing   Problem: Clinical Measurements: Goal: Complications related to the disease process or treatment will be avoided or minimized Outcome: Progressing   Problem: Activity: Goal: Activity intolerance will improve Outcome: Progressing   Problem: Fluid Volume: Goal: Fluid volume balance will be maintained or improved Outcome: Progressing   Problem: Respiratory: Goal: Respiratory symptoms related to disease process will be avoided Outcome: Progressing   Problem: Urinary Elimination: Goal: Progression of disease will be identified and treated Outcome: Progressing   Problem: Activity: Goal: Capacity to carry out activities will improve Outcome: Progressing   Problem: Cardiac: Goal: Ability to achieve and maintain adequate cardiopulmonary perfusion will improve Outcome: Progressing   Problem: Health Behavior/Discharge Planning: Goal: Ability to manage health-related needs will improve Outcome: Progressing   Problem: Clinical Measurements: Goal: Ability to maintain clinical measurements within normal limits will improve Outcome: Progressing Goal: Will remain free from infection Outcome: Progressing Goal: Diagnostic test results will improve Outcome: Progressing Goal: Respiratory complications will improve Outcome: Progressing Goal: Cardiovascular complication will be avoided Outcome: Progressing   Problem: Activity: Goal: Risk for activity intolerance will decrease Outcome: Progressing   Problem: Nutrition: Goal: Adequate nutrition will be maintained Outcome: Progressing   Problem: Coping: Goal: Level of anxiety will decrease Outcome: Progressing   Problem: Elimination: Goal: Will not experience complications related to bowel motility Outcome: Progressing Goal: Will not experience complications related to urinary retention Outcome: Progressing   Problem: Pain  Managment: Goal: General experience of comfort will improve Outcome: Progressing   Problem: Safety: Goal: Ability to remain free from injury will improve Outcome: Progressing   Problem: Skin Integrity: Goal: Risk for impaired skin integrity will decrease Outcome: Progressing   Problem: Activity: Goal: Ability to tolerate increased activity will improve Outcome: Progressing   Problem: Respiratory: Goal: Ability to maintain a clear airway and adequate ventilation will improve Outcome: Progressing   Problem: Role Relationship: Goal: Method of communication will improve Outcome: Progressing   Problem: Education: Goal: Ability to manage disease process will improve Outcome: Progressing   Problem: Cardiac: Goal: Ability to achieve and maintain adequate cardiopulmonary perfusion will improve Outcome: Progressing   Problem: Neurologic: Goal: Promote progressive neurologic recovery Outcome: Progressing   Problem: Skin Integrity: Goal: Risk for impaired skin integrity will be minimized. Outcome: Progressing

## 2020-11-17 NOTE — Progress Notes (Signed)
Patient removed from TTM for test in MRI.

## 2020-11-17 NOTE — Progress Notes (Addendum)
eLink Physician-Brief Progress Note Patient Name: Ricky Hanson DOB: 07-14-1951 MRN: [id number]   Date of Service  11/17/2020  HPI/Events of Note  Severe agitation, pain, nods his head yes , pulling lines and tubes. Has pulled out CVC earlier as well. Did not tolerate precedex and fentanyl boluses were ineffective. Very agitated and about to self extubate on camera.   eICU Interventions  Fentanyl infusion  Follow up LA for 11 pm  Restraints ordered RN had questions about CTA head/neck, defer this to neuro team      Intervention Category Major Interventions: Delirium, psychosis, severe agitation - evaluation and management;Other:  Oretha Milch 11/17/2020, 8:25 PM   12:50 am RN did an EKG because the monitor was alarming EKG reviewed - sinus tach with ST depressions (ST depressions are not new, he had RBBB earlier as well) Patient on 50 mic/hour fentanyl and is very calm at this time after RN gave fent boluses, HR is 85 and bp stable Echo was normal and last checked troponin was on 12.22 so will just trend that one to make sure it is lowering No acute intervention needed at this time  3:15 am Extreme agitation where he sat up In bed and was ready to pull out his ETT, RN had to hold him down Change fentanyl bolus dose, increase fentanyl to max at 250 mic/hour Adding PRN versed and if needed, then will also add propofol drip in order to prevent dislodgement of ETT D/w RN

## 2020-11-17 NOTE — Progress Notes (Signed)
Subjective: No acute events overnight.  ROS: Unable to obtain due to poor mental status  Examination  Vital signs in last 24 hours: Temp:  [96.26 F (35.7 C)-99.32 F (37.4 C)] 98.6 F (37 C) (12/24 1000) Pulse Rate:  [55-77] 58 (12/24 1030) Resp:  [17-28] 28 (12/24 1030) BP: (72-164)/(40-77) 164/77 (12/24 1030) SpO2:  [99 %-100 %] 100 % (12/24 1030) FiO2 (%):  [40 %] 40 % (12/24 0821) Weight:  [83.7 kg] 83.7 kg (12/24 0500)  General: lying in bed, not in apparent distress CVS: pulse-normal rate and rhythm RS: breathing comfortably Extremities: normal, warm Neuro:  opens eyes to repeated tactile stimuli, followed simple commands intermittently like closing eyes, sticking out tongue, PERLA, able to look at examiner, cough reflex intact, antigravity in both upper extremities with semi-purposeful movements, foot flexion and extension only with noxious stimulation in left lower extremity, RLE above knee amputation  Basic Metabolic Panel: Recent Labs  Lab 11/14/20 0803 11/15/20 0113 11/15/20 0345 11/15/20 0946 11/15/20 1008 11/15/20 1444 11/15/20 1703 11/16/20 0410 11/16/20 1707 11/17/20 0519  NA 138   < > 138 139  --  137  --  136  --  138  K 4.1   < > 3.9 4.1  --  4.2  --  3.7  --  3.7  CL 109  --  111  --   --  112*  --  110  --  115*  CO2 18*  --  15*  --   --  18*  --  16*  --  16*  GLUCOSE 111*  --  197*  --   --  124*  --  154*  --  149*  BUN 18  --  16  --   --  21  --  22  --  30*  CREATININE 1.55*  --  1.64*  --   --  1.69*  --  1.59*  --  1.68*  CALCIUM 9.0  --  8.2*  --   --  8.2*  --  8.0*  --  7.7*  MG  --   --  1.7  --  2.6*  --  2.4 2.3 2.3  --   PHOS  --   --   --   --  2.6  --  2.4* 2.0* 3.3  --    < > = values in this interval not displayed.    CBC: Recent Labs  Lab 11/13/20 0419 11/15/20 0113 11/15/20 0345 11/15/20 0946 11/16/20 0410 11/16/20 1329 11/17/20 0519  WBC 14.5*  --  36.4*  --  19.3* 17.8* 17.8*  HGB 8.0*   < > 7.4* 7.8* 6.3* 7.6*  7.4*  HCT 25.6*   < > 22.9* 23.0* 19.2* 23.0* 23.1*  MCV 82.1  --  80.9  --  78.0* 80.1 81.1  PLT 453*  --  382  --  270 260 251   < > = values in this interval not displayed.     Coagulation Studies: Recent Labs    11/15/20 0345  LABPROT 14.9  INR 1.2    Imaging MRI brain wo contrast 12/24/ 2021: Small subacute to chronic white matter infarct in the subcortical right frontal lobe. No acute insult. 2. Remote cortical infarcts along the left cerebral convexity, likely watershed injury. There is chronic small vessel ischemia and chronic lacunar infarcts. 3. Areas of inferior temporal and frontal gliosis which may reflect remote trauma. 4. Brain atrophy       ASSESSMENT  AND PLAN: 69yo M s/p in hospital cardiac arrest on 11/15/2020 , ROSC after 20 mins.   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  Patient is outside of TPA window.  Recommendations: -CTA head and neck for stroke work-up -TTE done on 11/15/2020 showed normal ejection fraction of 55 to 60% with impaired relaxation (grade 1 diastolic dysfunction). -TSH on 12/16 was 0.597.  A1c on 12/15 was 5.7.  Lipid panel ordered and pending -We will discontinue LTM EEG as EEG appears to be improving with no seizures -Continue Keppra 500 mg twice daily - If patient has any clinical seizures, we can increase keppra to 1000mg  BID -In-hospital cardiac arrest with ROSC in 20 minutes patient's neurologic exam (awake, following commands intermittently), EEG and MRI findings are suggestive of possibly mild anoxic/hypoxic brain injury with relatively good chances of neurologic recovery -Continue aspirin for secondary stroke prevention.  Start atorvastatin 80 mg for secondary stroke prevention. - seizure precautions - PRN IV ativan 2mg  for clinical sz -  management of rest of comorbidities per primary team  I have spent a total of  35  minutes with the patient reviewing hospital notes,  test results, labs and examining the patient as well as establishing an assessment and plan.  > 50% of time was spent in direct patient care.     [facility] For questions after 5pm please refer to AMION to reach the Neurologist on call

## 2020-11-17 NOTE — Progress Notes (Signed)
NAME:  Ricky Hanson, MRN:  [id number], DOB:  Jan 25, 1951, LOS: 8 ADMISSION DATE:  11/08/2020, CONSULTATION DATE: 12/22 REFERRING MD: Dr. Zenaida Niece, CHIEF COMPLAINT: Cardiac arrest  Brief History:  69 year old male status post cardiac arrest  History of Present Illness:  This is a 69 year old black male that presented originally with obstructive uropathy and acute renal insufficiency.  He had been diagnosed during the hospital stay with a urinary tract infection.  He developed ventricular fibrillation and cardiac arrest.  Patient was pulseless for approximately 20 minutes.  Patient was intubated during this event.  Past Medical History:  Diabetes mellitus Hyperlipidemia Hypertension Schizophrenia  Significant Hospital Events:  Cardiac arrest  Consults:  Neurology  Procedures:  Triple-lumen catheter 12/22 Arterial line 12/22  Significant Diagnostic Tests:  12/22 CXR - ETT in place, L subclavian CL in place. Bilateral patchy opacity at lung bases. Edema vs infection.  12/22 CT Head w/o C - No changes from imaging 6 days prior. Chronic ischemic insults  EEG 12/22>>> This study is suggestive of severe diffuse encephalopathy, nonspecific etiology. No seizures or epileptiform discharges were seen throughout the recording. 2D echo 12/22>>> EF 55-60%, grade 1 diastolic dysfunction, normal RV, small pericardial effusion, no valvular abnormalities   12/24 MRI brain: Small subacute to chronic white matter infarct in the subcortical right frontal lobe. No acute insult. Remote cortical infarcts along the left cerebral convexity, likely watershed injury. There is chronic small vessel ischemia and chronic lacunar infarcts. Areas of inferior temporal and frontal gliosis which may reflect remote trauma. Brain atrophy  Micro Data:  Covid negative Influenza negative Urine 12/15>> Providencia Sputum 12/22>>> few GNR, rare GPC>>>  Antimicrobials:  12/15-12/21 Cefepime completed 7 day course Zosyn  12/22 >>>  Interim history/subjective:  EEG showed no more seizures MRI brain was done this morning showed mild hypoxic/anoxic injury otherwise chronic changes  Objective   Blood pressure 131/62, pulse (!) 56, temperature 98.1 F (36.7 C), temperature source Esophageal, resp. rate (!) 28, height 6\' 1"  (1.854 m), weight 83.7 kg, SpO2 100 %.    Vent Mode: PRVC FiO2 (%):  [40 %] 40 % Set Rate:  [18 bmp-28 bmp] 28 bmp Vt Set:  [480 mL] 480 mL PEEP:  [5 cmH20] 5 cmH20 Plateau Pressure:  [19 cmH20-20 cmH20] 20 cmH20   Intake/Output Summary (Last 24 hours) at 11/17/2020 1250 Last data filed at 11/17/2020 1200 Gross per 24 hour  Intake 2611.68 ml  Output 1815 ml  Net 796.68 ml   Filed Weights   11/15/20 0130 11/16/20 0500 11/17/20 0500  Weight: 79 kg 79.2 kg 83.7 kg    Examination: General: Chronically ill elderly African-American male, orally intubated HENT: Atraumatic, normocephalic.  mm moist, ETT< no JVD  Lungs: resps even non labored on vent, diminished bases otherwise clear  Cardiovascular: Regular rate, no murmur Abdomen: Soft nondistended. BS + Extremities: Contracture of right wrist, [name]  Neuro: sedated, RASS -2, per RN was awake, eyes open sitting up in bed but not following commands  Assessment & Plan:  Status post VT/VF cardiac arrest - ~21mins downtime Acute hypoxic respiratory failure due to possible aspiration pneumonia Acute hypoxic/metabolic encephalopathy Right frontal subacute to acute infarct  Continue lung protective ventilation Patient remained encephalopathic Minimize sedation with RASS goal 0/-1 MRI brain was done during this morning, showed minimal hypoxic/anoxic injury, otherwise chronic changes EEG showed no more seizures Continue Keppra 500 mg twice daily Neurology input is appreciated, recommend doing CT of head and neck Continue aspirin and atorvastatin for  secondary stroke prophylaxis TTE showed normal ejection fraction Continue IV Zosyn  to complete 5 days therapy  Acute kidney injury on CKD stage IIIa - baseline Scr ~1.8 Patient serum creatinine remain elevated We will give him 1 L IV fluid bolus Repeat BMP in the morning  Anemia of critical illness Patient received 1 unit PRBCs so far during this admission H&H is stable, transfuse if less than 7  DM Continue sliding scale insulin   Best practice (evaluated daily)  Diet: TF Pain/Anxiety/Delirium protocol (if indicated): Fentanyl, Precedex VAP protocol (if indicated): Initiated DVT prophylaxis: Lovenox GI prophylaxis: Protonix Glucose control: Insulin sliding scale Goals of Care: 12/22 with patient's brother and patient's son, decision was to continue aggressive care and patient remain full code Mobility: As tolerated Disposition: ICU  Goals of Care:   Code Status: Full  Labs   CBC: Recent Labs  Lab 11/13/20 0419 11/15/20 0113 11/15/20 0345 11/15/20 0946 11/16/20 0410 11/16/20 1329 11/17/20 0519  WBC 14.5*  --  36.4*  --  19.3* 17.8* 17.8*  HGB 8.0*   < > 7.4* 7.8* 6.3* 7.6* 7.4*  HCT 25.6*   < > 22.9* 23.0* 19.2* 23.0* 23.1*  MCV 82.1  --  80.9  --  78.0* 80.1 81.1  PLT 453*  --  382  --  270 260 251   < > = values in this interval not displayed.    Basic Metabolic Panel: Recent Labs  Lab 11/14/20 0803 11/15/20 0113 11/15/20 0345 11/15/20 0946 11/15/20 1008 11/15/20 1444 11/15/20 1703 11/16/20 0410 11/16/20 1707 11/17/20 0519  NA 138   < > 138 139  --  137  --  136  --  138  K 4.1   < > 3.9 4.1  --  4.2  --  3.7  --  3.7  CL 109  --  111  --   --  112*  --  110  --  115*  CO2 18*  --  15*  --   --  18*  --  16*  --  16*  GLUCOSE 111*  --  197*  --   --  124*  --  154*  --  149*  BUN 18  --  16  --   --  21  --  22  --  30*  CREATININE 1.55*  --  1.64*  --   --  1.69*  --  1.59*  --  1.68*  CALCIUM 9.0  --  8.2*  --   --  8.2*  --  8.0*  --  7.7*  MG  --   --  1.7  --  2.6*  --  2.4 2.3 2.3  --   PHOS  --   --   --   --  2.6  --   2.4* 2.0* 3.3  --    < > = values in this interval not displayed.   GFR: Estimated Creatinine Clearance: 48.2 mL/min (A) (by C-G formula based on SCr of 1.68 mg/dL (H)). Recent Labs  Lab 11/15/20 0345 11/16/20 0410 11/16/20 1329 11/17/20 0519  WBC 36.4* 19.3* 17.8* 17.8*  LATICACIDVEN 1.3  --   --   --     Liver Function Tests: Recent Labs  Lab 11/13/20 0419 11/15/20 0345  AST 12* 195*  ALT 13 173*  ALKPHOS 84 91  BILITOT 0.7 1.0  PROT 6.5 6.1*  ALBUMIN 2.3* 2.2*   No results for input(s): LIPASE, AMYLASE in the last  168 hours. No results for input(s): AMMONIA in the last 168 hours.  ABG    Component Value Date/Time   PHART 7.417 11/15/2020 0946   PCO2ART 27.6 (L) 11/15/2020 0946   PO2ART 81 (L) 11/15/2020 0946   HCO3 18.0 (L) 11/15/2020 0946   TCO2 19 (L) 11/15/2020 0946   ACIDBASEDEF 6.0 (H) 11/15/2020 0946   O2SAT 97.0 11/15/2020 0946     Coagulation Profile: Recent Labs  Lab 11/15/20 0345  INR 1.2    Cardiac Enzymes: No results for input(s): CKTOTAL, CKMB, CKMBINDEX, TROPONINI in the last 168 hours.  HbA1C: Hgb A1c MFr Bld  Date/Time Value Ref Range Status  11/08/2020 09:39 PM 5.7 (H) 4.8 - 5.6 % Final    Comment:    (NOTE) Pre diabetes:          5.7%-6.4%  Diabetes:              >6.4%  Glycemic control for   <7.0% adults with diabetes   02/02/2020 02:27 PM 5.5 4.8 - 5.6 % Final    Comment:             Prediabetes: 5.7 - 6.4          Diabetes: >6.4          Glycemic control for adults with diabetes: <7.0     CBG: Recent Labs  Lab 11/16/20 1924 11/16/20 2326 11/17/20 0322 11/17/20 0754 11/17/20 1215  GLUCAP 151* 145* 130* 172* 148*    Total critical care time: 37 minutes  Performed by: Cheri Fowler   Critical care time was exclusive of separately billable procedures and treating other patients.   Critical care was necessary to treat or prevent imminent or life-threatening deterioration.   Critical care was time spent  personally by me on the following activities: development of treatment plan with patient and/or surrogate as well as nursing, discussions with consultants, evaluation of patient's response to treatment, examination of patient, obtaining history from patient or surrogate, ordering and performing treatments and interventions, ordering and review of laboratory studies, ordering and review of radiographic studies, pulse oximetry and re-evaluation of patient's condition.   Cheri Fowler MD [facility] Pager: [phone] Mobile: [phone]

## 2020-11-17 NOTE — Progress Notes (Signed)
Elink aware- vent dysynchrony, patient pulling at tube in attempt to self-extubate, shakes head yes for symptoms of pain.  Notified of lactic acid result of 2.7, bolus currently still infusing (hung on day shift).

## 2020-11-17 NOTE — Progress Notes (Signed)
LTM EEG discontinued. No skin breakdown noted. 

## 2020-11-17 NOTE — Progress Notes (Signed)
Neurologist rounding at bedside- Dr. Derry Lory.  Aware of patient sedation needs/current infusion rates of Fentanyl/restraints and patient agitation.  Per MD, CT scan can wait until tomorrow during day.

## 2020-11-17 NOTE — Progress Notes (Signed)
MD notified of low b/P orders given for LR fluid blous

## 2020-11-17 NOTE — Progress Notes (Signed)
eLink Physician-Brief Progress Note Patient Name: Ricky Hanson DOB: 1951/09/10 MRN: [id number]   Date of Service  11/17/2020  HPI/Events of Note  Patient with sub-optimal sedation on the ventilator on current regimen.  eICU Interventions  PRN Fentanyl increased to 25-100 mcg and the dosing interval shortened to Q 1 hour.        Thomasene Lot Ricky Hanson 11/17/2020, 12:06 AM

## 2020-11-17 NOTE — Progress Notes (Signed)
Patient placed back on TTM upon return from MRI.

## 2020-11-17 NOTE — Procedures (Addendum)
Patient Name:Jerardo Latour EQA:[id number] Epilepsy Attending:Francia Verry Annabelle Harman Referring Physician/Provider:Dr. Glyn Ade Duration:11/16/2020 0953 to 11/17/2020 [phone]  Patient history:69 year old man status post cardiac arrest. EEG to evaluate for seizures.  Level of alertness:Awake/lethargic  AEDs during EEG study:None  Technical aspects: This EEG study was done with scalp electrodes positioned according to the 10-20 International system of electrode placement. Electrical activity was acquired at a sampling rate of 500Hz  and reviewed with a high frequency filter of 70Hz  and a low frequency filter of 1Hz . EEG data were recorded continuously and digitally stored.   Description:No clear posterior dominant rhythm was seen. EEG showed continuous generalized  predominantly 5 to 6 Hz theta as well as intermittent 2 to 3 Hz delta slowing. 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 These discharges were intermittent and seen more frequently when patient was awake/stimulated.  Hyperventilation and photic stimulation were not performed.   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  The morphology of the discharges as well as the increase in frequency when patient was awake/stimulated suggest that they are more likely interictal nature.  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  EEG appears improving to yesterday.  Lissete Maestas 

## 2020-11-18 DIAGNOSIS — N183 Chronic kidney disease, stage 3 unspecified: Secondary | ICD-10-CM | POA: Diagnosis not present

## 2020-11-18 DIAGNOSIS — N179 Acute kidney failure, unspecified: Secondary | ICD-10-CM | POA: Diagnosis not present

## 2020-11-18 DIAGNOSIS — I469 Cardiac arrest, cause unspecified: Secondary | ICD-10-CM | POA: Diagnosis not present

## 2020-11-18 DIAGNOSIS — Z978 Presence of other specified devices: Secondary | ICD-10-CM | POA: Diagnosis not present

## 2020-11-18 LAB — PHOSPHORUS: Phosphorus: 2.2 mg/dL — ABNORMAL LOW (ref 2.5–4.6)

## 2020-11-18 LAB — GLUCOSE, CAPILLARY
Glucose-Capillary: 119 mg/dL — ABNORMAL HIGH (ref 70–99)
Glucose-Capillary: 147 mg/dL — ABNORMAL HIGH (ref 70–99)
Glucose-Capillary: 150 mg/dL — ABNORMAL HIGH (ref 70–99)
Glucose-Capillary: 153 mg/dL — ABNORMAL HIGH (ref 70–99)
Glucose-Capillary: 168 mg/dL — ABNORMAL HIGH (ref 70–99)
Glucose-Capillary: 171 mg/dL — ABNORMAL HIGH (ref 70–99)

## 2020-11-18 LAB — COMPREHENSIVE METABOLIC PANEL
ALT: 62 U/L — ABNORMAL HIGH (ref 0–44)
AST: 28 U/L (ref 15–41)
Albumin: 2.1 g/dL — ABNORMAL LOW (ref 3.5–5.0)
Alkaline Phosphatase: 95 U/L (ref 38–126)
Anion gap: 11 (ref 5–15)
BUN: 28 mg/dL — ABNORMAL HIGH (ref 8–23)
CO2: 19 mmol/L — ABNORMAL LOW (ref 22–32)
Calcium: 8.1 mg/dL — ABNORMAL LOW (ref 8.9–10.3)
Chloride: 114 mmol/L — ABNORMAL HIGH (ref 98–111)
Creatinine, Ser: 1.55 mg/dL — ABNORMAL HIGH (ref 0.61–1.24)
GFR, Estimated: 49 mL/min — ABNORMAL LOW (ref >60)
Glucose, Bld: 131 mg/dL — ABNORMAL HIGH (ref 70–99)
Potassium: 3.7 mmol/L (ref 3.5–5.1)
Sodium: 144 mmol/L (ref 135–145)
Total Bilirubin: 0.7 mg/dL (ref 0.3–1.2)
Total Protein: 6 g/dL — ABNORMAL LOW (ref 6.5–8.1)

## 2020-11-18 LAB — CBC
HCT: 24.8 % — ABNORMAL LOW (ref 39.0–52.0)
Hemoglobin: 7.9 g/dL — ABNORMAL LOW (ref 13.0–17.0)
MCH: 26.2 pg (ref 26.0–34.0)
MCHC: 31.9 g/dL (ref 30.0–36.0)
MCV: 82.1 fL (ref 80.0–100.0)
Platelets: 242 10*3/uL (ref 150–400)
RBC: 3.02 MIL/uL — ABNORMAL LOW (ref 4.22–5.81)
RDW: 15.7 % — ABNORMAL HIGH (ref 11.5–15.5)
WBC: 15.4 10*3/uL — ABNORMAL HIGH (ref 4.0–10.5)
nRBC: 0.1 % (ref 0.0–0.2)

## 2020-11-18 LAB — LIPID PANEL
Cholesterol: 123 mg/dL (ref 0–200)
HDL: 39 mg/dL — ABNORMAL LOW (ref >40)
LDL Cholesterol: 64 mg/dL (ref 0–99)
Total CHOL/HDL Ratio: 3.2 RATIO
Triglycerides: 100 mg/dL (ref <150)
VLDL: 20 mg/dL (ref 0–40)

## 2020-11-18 LAB — TRIGLYCERIDES: Triglycerides: 73 mg/dL (ref <150)

## 2020-11-18 LAB — TROPONIN I (HIGH SENSITIVITY): Troponin I (High Sensitivity): 594 ng/L (ref <18)

## 2020-11-18 LAB — LACTIC ACID, PLASMA: Lactic Acid, Venous: 2 mmol/L (ref 0.5–1.9)

## 2020-11-18 MED ORDER — MIDAZOLAM HCL 2 MG/2ML IJ SOLN
2.0000 mg | INTRAMUSCULAR | Status: DC | PRN
  Administered 2020-11-18: 04:00:00 2 mg via INTRAVENOUS
  Filled 2020-11-18: qty 2

## 2020-11-18 MED ORDER — POTASSIUM PHOSPHATES 15 MMOLE/5ML IV SOLN
30.0000 mmol | Freq: Once | INTRAVENOUS | Status: AC
  Administered 2020-11-18: 14:00:00 30 mmol via INTRAVENOUS
  Filled 2020-11-18: qty 10

## 2020-11-18 MED ORDER — ATORVASTATIN CALCIUM 40 MG PO TABS
80.0000 mg | ORAL_TABLET | Freq: Every day | ORAL | Status: DC
  Administered 2020-11-20: 09:00:00 80 mg via ORAL
  Filled 2020-11-18 (×2): qty 2

## 2020-11-18 MED ORDER — ACETAMINOPHEN 650 MG RE SUPP: 650.0000 mg | Freq: Four times a day (QID) | RECTAL | Status: DC | PRN

## 2020-11-18 MED ORDER — MIRTAZAPINE 15 MG PO TBDP
15.0000 mg | ORAL_TABLET | Freq: Every day | ORAL | Status: DC
  Filled 2020-11-18 (×2): qty 1

## 2020-11-18 MED ORDER — ACETAMINOPHEN 325 MG PO TABS: 650.0000 mg | ORAL_TABLET | Freq: Four times a day (QID) | ORAL | Status: DC | PRN

## 2020-11-18 MED ORDER — ORAL CARE MOUTH RINSE
15.0000 mL | Freq: Two times a day (BID) | OROMUCOSAL | Status: DC
  Administered 2020-11-19 – 2020-12-03 (×27): 15 mL via OROMUCOSAL

## 2020-11-18 MED ORDER — ASPIRIN 81 MG PO CHEW
81.0000 mg | CHEWABLE_TABLET | Freq: Every day | ORAL | Status: DC
  Administered 2020-11-20: 09:00:00 81 mg via ORAL
  Filled 2020-11-18 (×2): qty 1

## 2020-11-18 MED ORDER — PROPOFOL 1000 MG/100ML IV EMUL: 5.0000 ug/kg/min | INTRAVENOUS | Status: DC

## 2020-11-18 MED ORDER — HEPARIN SODIUM (PORCINE) 5000 UNIT/ML IJ SOLN
5000.0000 [IU] | Freq: Three times a day (TID) | INTRAMUSCULAR | Status: DC
  Administered 2020-11-18 – 2020-11-23 (×17): 5000 [IU] via SUBCUTANEOUS
  Filled 2020-11-18 (×17): qty 1

## 2020-11-18 MED ORDER — BENZTROPINE MESYLATE 1 MG PO TABS
1.0000 mg | ORAL_TABLET | Freq: Every day | ORAL | Status: DC
  Filled 2020-11-18 (×2): qty 1

## 2020-11-18 MED ORDER — FENTANYL BOLUS VIA INFUSION
50.0000 ug | INTRAVENOUS | Status: DC | PRN
  Administered 2020-11-18: 50 ug via INTRAVENOUS
  Filled 2020-11-18: qty 50

## 2020-11-18 NOTE — Progress Notes (Signed)
Critical troponin of 594 reported to elink. No new orders at this time.

## 2020-11-18 NOTE — Progress Notes (Signed)
Patient sitting up in bed, jerking up on restraints. Multiple times lifting chest out of bed, biting tube, shaking head forcefully to remove tube.  Have attempted several boluses of Fentanyl with no effect. Elink camera'd in and observed combative behavior and ventilator dysynchrony. Fentanyl drip placed on max infusion rate to protect intubation per elink.

## 2020-11-18 NOTE — Procedures (Signed)
Extubation Procedure Note  Patient Details:   Name: Ricky Hanson DOB: July 11, 1951 MRN: [id number]   Airway Documentation:    Vent end date: 11/18/20 Vent end time: 1140   Evaluation  O2 sats: stable throughout Complications: No apparent complications Patient did tolerate procedure well. Bilateral Breath Sounds: Clear   Yes   Patient extubated to 2L nasal cannula.  Positive cuff leak noted.  No evidence of stridor.  Sats and vitals stable.  No complications noted.   Elyn Peers 11/18/2020, 11:43 AM

## 2020-11-18 NOTE — Progress Notes (Signed)
ST elevation observed on cardiac monitor. Elink notified and 12 lead ordered and performed. 12 lead: Marked ST abnormality, possible anterolateral subendocardial injury. Elink immediately notified and aware of EKG results.  Patient remains highly agitated, pulls at restraints, sits up in bed and attempts to get out of bed, has attempted to self extubate.  Fentanyl drip titrated per orders to maintain intubation.

## 2020-11-18 NOTE — Progress Notes (Signed)
Patient has required tube advancement twice since 1914 due to agitation from lack of sedation.  Commercial tube holder has been replaced, as patient has manage to dislodge the previous one multiple times during self extubation attempts.  Patient also required being physically held in bed during attempts to resecure OETT and acquire EKG.

## 2020-11-18 NOTE — Progress Notes (Signed)
Dr. Roxan Hockey has reviewed EKG. Troponin ordered. No other orders received.  Lab notified of troponin order.

## 2020-11-18 NOTE — Progress Notes (Signed)
NAME:  Ricky Hanson, MRN:  [id number], DOB:  October 16, 1951, LOS: 9 ADMISSION DATE:  11/08/2020, CONSULTATION DATE: 12/22 REFERRING MD: Dr. Zenaida Niece, CHIEF COMPLAINT: Cardiac arrest  Brief History:  69 year old male status post cardiac arrest  History of Present Illness:  This is a 69 year old black male that presented originally with obstructive uropathy and acute renal insufficiency.  He had been diagnosed during the hospital stay with a urinary tract infection.  He developed ventricular fibrillation and cardiac arrest.  Patient was pulseless for approximately 20 minutes.  Patient was intubated during this event.  Past Medical History:  Diabetes mellitus Hyperlipidemia Hypertension Schizophrenia  Significant Hospital Events:  Cardiac arrest  Consults:  Neurology  Procedures:  Triple-lumen catheter 12/22 Arterial line 12/22  Significant Diagnostic Tests:  12/22 CXR - ETT in place, L subclavian CL in place. Bilateral patchy opacity at lung bases. Edema vs infection.  12/22 CT Head w/o C - No changes from imaging 6 days prior. Chronic ischemic insults  EEG 12/22>>> This study is suggestive of severe diffuse encephalopathy, nonspecific etiology. No seizures or epileptiform discharges were seen throughout the recording. 2D echo 12/22>>> EF 55-60%, grade 1 diastolic dysfunction, normal RV, small pericardial effusion, no valvular abnormalities   12/24 MRI brain: Small subacute to chronic white matter infarct in the subcortical right frontal lobe. No acute insult. Remote cortical infarcts along the left cerebral convexity, likely watershed injury. There is chronic small vessel ischemia and chronic lacunar infarcts. Areas of inferior temporal and frontal gliosis which may reflect remote trauma. Brain atrophy  Micro Data:  Covid negative Influenza negative Urine 12/15>> Providencia Sputum 12/22>>> few GNR, rare GPC>>>  Antimicrobials:  12/15-12/21 Cefepime completed 7 day course Zosyn  12/22 >>>  Interim history/subjective:  Patient was agitated overnight, requiring multiple doses of IV fentanyl and Versed, this morning he was placed on pressure support trial which she tolerated well, successfully extubated.  Still remain lethargic because of multiple doses of sedating medications  Objective   Blood pressure 112/64, pulse 85, temperature 98.42 F (36.9 C), resp. rate (!) 8, height 6\' 1"  (1.854 m), weight 84.6 kg, SpO2 100 %.    Vent Mode: PRVC FiO2 (%):  [40 %] 40 % Set Rate:  [28 bmp] 28 bmp Vt Set:  [480 mL] 480 mL PEEP:  [5 cmH20] 5 cmH20 Pressure Support:  [10 cmH20] 10 cmH20 Plateau Pressure:  [16 cmH20-18 cmH20] 16 cmH20   Intake/Output Summary (Last 24 hours) at 11/18/2020 1428 Last data filed at 11/18/2020 1300 Gross per 24 hour  Intake 2238.82 ml  Output 2525 ml  Net -286.18 ml   Filed Weights   11/16/20 0500 11/17/20 0500 11/18/20 0500  Weight: 79.2 kg 83.7 kg 84.6 kg    Examination: General: Chronically ill elderly African-American male, lying on the bed HENT: Atraumatic, normocephalic.  mm moist, no JVD Lungs: Decreased air entry at the bases bilaterally, no wheezes Cardiovascular: Regular rate, no murmur Abdomen: Soft nondistended. 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  Patient tolerated pressure support trial this morning, successfully extubated He remained lethargic because of multiple doses of IV fentanyl and Versed last night, will keep him on closer watch Discontinue all sedation MRI brain showed acute  to subacute right frontal stroke EEG ruled out seizures Continue Keppra  500 mg twice daily CT head and neck is pending Continue aspirin and atorvastatin for secondary stroke prophylaxis TTE showed normal ejection fraction Continue IV Zosyn to complete 5 days therapy  Acute delirium Patient remained agitated overnight requiring multiple doses of IV fentanyl and Versed Currently he is calm but lethargic Avoid sedation  Acute kidney injury on CKD stage IIIa - baseline Scr ~1.6 - 1.8 Serum creatinine has improved to the baseline  Anemia of critical illness Patient received 1 unit PRBCs so far during this admission H&H is stable, transfuse if less than 7  DM Continue sliding scale insulin   Best practice (evaluated daily)  Diet: TF Pain/Anxiety/Delirium protocol (if indicated): N/A VAP protocol (if indicated): N/A DVT prophylaxis: Lovenox GI prophylaxis: Protonix Glucose control: Insulin sliding scale Goals of Care: 12/22 with patient's brother and patient's son, decision was to continue aggressive care and patient remain full code Mobility: As tolerated Disposition: ICU  Goals of Care:   Code Status: Full  Labs   CBC: Recent Labs  Lab 11/15/20 0345 11/15/20 0946 11/16/20 0410 11/16/20 1329 11/17/20 0519 11/18/20 0031  WBC 36.4*  --  19.3* 17.8* 17.8* 15.4*  HGB 7.4* 7.8* 6.3* 7.6* 7.4* 7.9*  HCT 22.9* 23.0* 19.2* 23.0* 23.1* 24.8*  MCV 80.9  --  78.0* 80.1 81.1 82.1  PLT 382  --  270 260 251 242    Basic Metabolic Panel: Recent Labs  Lab 11/15/20 0345 11/15/20 0946 11/15/20 1008 11/15/20 1444 11/15/20 1703 11/16/20 0410 11/16/20 1707 11/17/20 0519 11/18/20 0031  NA 138 139  --  137  --  136  --  138 144  K 3.9 4.1  --  4.2  --  3.7  --  3.7 3.7  CL 111  --   --  112*  --  110  --  115* 114*  CO2 15*  --   --  18*  --  16*  --  16* 19*  GLUCOSE 197*  --   --  124*  --  154*  --  149* 131*  BUN 16  --   --  21  --  22  --  30* 28*  CREATININE 1.64*  --   --  1.69*  --  1.59*  --  1.68* 1.55*  CALCIUM 8.2*  --   --  8.2*   --  8.0*  --  7.7* 8.1*  MG 1.7  --  2.6*  --  2.4 2.3 2.3  --   --   PHOS  --   --  2.6  --  2.4* 2.0* 3.3  --  2.2*   GFR: Estimated Creatinine Clearance: 52.3 mL/min (A) (by C-G formula based on SCr of 1.55 mg/dL (H)). Recent Labs  Lab 11/15/20 0345 11/16/20 0410 11/16/20 1329 11/17/20 0519 11/17/20 1841 11/18/20 0031 11/18/20 0502  WBC 36.4* 19.3* 17.8* 17.8*  --  15.4*  --   LATICACIDVEN 1.3  --   --   --  2.7*  --  2.0*    Liver Function Tests: Recent Labs  Lab 11/13/20 0419 11/15/20 0345 11/18/20 0031  AST 12* 195* 28  ALT 13 173* 62*  ALKPHOS 84 91 95  BILITOT 0.7 1.0 0.7  PROT 6.5 6.1* 6.0*  ALBUMIN 2.3* 2.2* 2.1*   No results for input(s): LIPASE, AMYLASE in the last 168 hours. No results for input(s): AMMONIA in the last 168 hours.  ABG    Component Value Date/Time   PHART 7.417 11/15/2020 0946   PCO2ART 27.6 (L) 11/15/2020 0946   PO2ART 81 (L) 11/15/2020 0946   HCO3 18.0 (L) 11/15/2020 0946   TCO2 19 (L) 11/15/2020 0946   ACIDBASEDEF 6.0 (H) 11/15/2020 0946   O2SAT 97.0 11/15/2020 0946     Coagulation Profile: Recent Labs  Lab 11/15/20 0345  INR 1.2    Cardiac Enzymes: No results for input(s): CKTOTAL, CKMB, CKMBINDEX, TROPONINI in the last 168 hours.  HbA1C: Hgb A1c MFr Bld  Date/Time Value Ref Range Status  11/08/2020 09:39 PM 5.7 (H) 4.8 - 5.6 % Final    Comment:    (NOTE) Pre diabetes:          5.7%-6.4%  Diabetes:              >6.4%  Glycemic control for   <7.0% adults with diabetes   02/02/2020 02:27 PM 5.5 4.8 - 5.6 % Final    Comment:             Prediabetes: 5.7 - 6.4          Diabetes: >6.4          Glycemic control for adults with diabetes: <7.0     CBG: Recent Labs  Lab 11/17/20 2007 11/18/20 0013 11/18/20 0419 11/18/20 0756 11/18/20 1134  GLUCAP 108* 119* 147* 150* 171*    Total critical care time: 35 minutes  Performed by: Cheri Fowler   Critical care time was exclusive of separately billable  procedures and treating other patients.   Critical care was necessary to treat or prevent imminent or life-threatening deterioration.   Critical care was time spent personally by me on the following activities: development of treatment plan with patient and/or surrogate as well as nursing, discussions with consultants, evaluation of patient's response to treatment, examination of patient, obtaining history from patient or surrogate, ordering and performing treatments and interventions, ordering and review of laboratory studies, ordering and review of radiographic studies, pulse oximetry and re-evaluation of patient's condition.   Cheri Fowler MD [facility] Pager: [phone] Mobile: [phone]

## 2020-11-19 ENCOUNTER — Inpatient Hospital Stay (HOSPITAL_COMMUNITY): Payer: Medicare Other

## 2020-11-19 DIAGNOSIS — N179 Acute kidney failure, unspecified: Secondary | ICD-10-CM | POA: Diagnosis not present

## 2020-11-19 DIAGNOSIS — Z978 Presence of other specified devices: Secondary | ICD-10-CM | POA: Diagnosis not present

## 2020-11-19 LAB — CBC
HCT: 25.2 % — ABNORMAL LOW (ref 39.0–52.0)
Hemoglobin: 8.3 g/dL — ABNORMAL LOW (ref 13.0–17.0)
MCH: 27.2 pg (ref 26.0–34.0)
MCHC: 32.9 g/dL (ref 30.0–36.0)
MCV: 82.6 fL (ref 80.0–100.0)
Platelets: 249 10*3/uL (ref 150–400)
RBC: 3.05 MIL/uL — ABNORMAL LOW (ref 4.22–5.81)
RDW: 16.2 % — ABNORMAL HIGH (ref 11.5–15.5)
WBC: 15.7 10*3/uL — ABNORMAL HIGH (ref 4.0–10.5)
nRBC: 0 % (ref 0.0–0.2)

## 2020-11-19 LAB — GLUCOSE, CAPILLARY
Glucose-Capillary: 105 mg/dL — ABNORMAL HIGH (ref 70–99)
Glucose-Capillary: 108 mg/dL — ABNORMAL HIGH (ref 70–99)
Glucose-Capillary: 114 mg/dL — ABNORMAL HIGH (ref 70–99)
Glucose-Capillary: 121 mg/dL — ABNORMAL HIGH (ref 70–99)
Glucose-Capillary: 128 mg/dL — ABNORMAL HIGH (ref 70–99)
Glucose-Capillary: 136 mg/dL — ABNORMAL HIGH (ref 70–99)
Glucose-Capillary: 137 mg/dL — ABNORMAL HIGH (ref 70–99)

## 2020-11-19 MED ORDER — DEXTROSE 50 % IV SOLN
INTRAVENOUS | Status: AC
  Filled 2020-11-19: qty 50

## 2020-11-19 NOTE — Progress Notes (Signed)
NAME:  Ricky Hanson, MRN:  [id number], DOB:  1951/03/03, LOS: 10 ADMISSION DATE:  11/08/2020, CONSULTATION DATE: 12/22 REFERRING MD: Dr. Zenaida Niece, CHIEF COMPLAINT: Cardiac arrest  Brief History:  69 year old male status post cardiac arrest  History of Present Illness:  This is a 69 year old black male that presented originally with obstructive uropathy and acute renal insufficiency.  He had been diagnosed during the hospital stay with a urinary tract infection.  He developed ventricular fibrillation and cardiac arrest.  Patient was pulseless for approximately 20 minutes.  Patient was intubated during this event.  Past Medical History:  Diabetes mellitus Hyperlipidemia Hypertension Schizophrenia  Significant Hospital Events:  Cardiac arrest  Consults:  Neurology  Procedures:  Triple-lumen catheter 12/22 Arterial line 12/22  Significant Diagnostic Tests:  12/22 CXR - ETT in place, L subclavian CL in place. Bilateral patchy opacity at lung bases. Edema vs infection.  12/22 CT Head w/o C - No changes from imaging 6 days prior. Chronic ischemic insults  EEG 12/22>>> This study is suggestive of severe diffuse encephalopathy, nonspecific etiology. No seizures or epileptiform discharges were seen throughout the recording. 2D echo 12/22>>> EF 55-60%, grade 1 diastolic dysfunction, normal RV, small pericardial effusion, no valvular abnormalities   12/24 MRI brain: Small subacute to chronic white matter infarct in the subcortical right frontal lobe. No acute insult. Remote cortical infarcts along the left cerebral convexity, likely watershed injury. There is chronic small vessel ischemia and chronic lacunar infarcts. Areas of inferior temporal and frontal gliosis which may reflect remote trauma. Brain atrophy  Micro Data:  Covid negative Influenza negative Urine 12/15>> Providencia Sputum 12/22>>> few GNR, rare GPC>>>  Antimicrobials:  12/15-12/21 Cefepime completed 7 day  course Zosyn 12/22 >>>  Interim history/subjective:  Patient's mental status has improved today, he is awake and fully responsive.  Unable to pass swallow evaluation  Objective   Blood pressure 122/75, pulse 96, temperature 97.9 F (36.6 C), temperature source Oral, resp. rate 19, height 6\' 1"  (1.854 m), weight 86 kg, SpO2 91 %.        Intake/Output Summary (Last 24 hours) at 11/19/2020 1342 Last data filed at 11/19/2020 1200 Gross per 24 hour  Intake 848.66 ml  Output 1720 ml  Net -871.34 ml   Filed Weights   11/17/20 0500 11/18/20 0500 11/19/20 0444  Weight: 83.7 kg 84.6 kg 86 kg    Examination: General: Chronically ill elderly African-American male, sitting on the bed HENT: Atraumatic, normocephalic.  mm moist, no JVD Lungs: Clear to auscultation bilaterally, no wheezes Cardiovascular: Regular rate, no murmur Abdomen: Soft nondistended. 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  Patient remained on room air, no overnight hypoxemia His mental status has improved Avoid sedating medication MRI brain showed acute to subacute right frontal stroke EEG ruled out seizures Continue Keppra 500 mg twice daily CT head and neck is pending recommended by neurology for further work-up of stroke Continue aspirin and atorvastatin for secondary stroke prophylaxis TTE showed normal ejection fraction Continue IV Zosyn to complete 5 days therapy  Acute delirium, improved Avoid sedation  Acute kidney injury on CKD stage IIIa - baseline Scr ~1.5 - 1.8 Serum creatinine has improved to the baseline  Anemia of critical illness Patient received 1 unit  PRBCs so far during this  admission No signs of bleeding H&H is stable, transfuse if less than 7  DM Continue sliding scale insulin   Best practice (evaluated daily)  Diet: Failed speech and swallow today, n.p.o. for now Pain/Anxiety/Delirium protocol (if indicated): N/A VAP protocol (if indicated): N/A DVT prophylaxis: Lovenox GI prophylaxis: Protonix Glucose control: Insulin sliding scale Goals of Care: 12/22 with patient's brother and patient's son, decision was to continue aggressive care and patient remain full code Next goals of care discussion is due on 12/29 Mobility: As tolerated Disposition: MedSurg floor  Goals of Care:   Code Status: Full  Labs   CBC: Recent Labs  Lab 11/15/20 0345 11/15/20 0946 11/16/20 0410 11/16/20 1329 11/17/20 0519 11/18/20 0031  WBC 36.4*  --  19.3* 17.8* 17.8* 15.4*  HGB 7.4* 7.8* 6.3* 7.6* 7.4* 7.9*  HCT 22.9* 23.0* 19.2* 23.0* 23.1* 24.8*  MCV 80.9  --  78.0* 80.1 81.1 82.1  PLT 382  --  270 260 251 242    Basic Metabolic Panel: Recent Labs  Lab 11/15/20 0345 11/15/20 0946 11/15/20 1008 11/15/20 1444 11/15/20 1703 11/16/20 0410 11/16/20 1707 11/17/20 0519 11/18/20 0031  NA 138 139  --  137  --  136  --  138 144  K 3.9 4.1  --  4.2  --  3.7  --  3.7 3.7  CL 111  --   --  112*  --  110  --  115* 114*  CO2 15*  --   --  18*  --  16*  --  16* 19*  GLUCOSE 197*  --   --  124*  --  154*  --  149* 131*  BUN 16  --   --  21  --  22  --  30* 28*  CREATININE 1.64*  --   --  1.69*  --  1.59*  --  1.68* 1.55*  CALCIUM 8.2*  --   --  8.2*  --  8.0*  --  7.7* 8.1*  MG 1.7  --  2.6*  --  2.4 2.3 2.3  --   --   PHOS  --   --  2.6  --  2.4* 2.0* 3.3  --  2.2*   GFR: Estimated Creatinine Clearance: 52.3 mL/min (A) (by C-G formula based on SCr of 1.55 mg/dL (H)). Recent Labs  Lab 11/15/20 0345 11/16/20 0410 11/16/20 1329 11/17/20 0519 11/17/20 1841 11/18/20 0031 11/18/20 0502  WBC 36.4* 19.3* 17.8* 17.8*  --  15.4*  --   LATICACIDVEN 1.3  --   --    --  2.7*  --  2.0*    Liver Function Tests: Recent Labs  Lab 11/13/20 0419 11/15/20 0345 11/18/20 0031  AST 12* 195* 28  ALT 13 173* 62*  ALKPHOS 84 91 95  BILITOT 0.7 1.0 0.7  PROT 6.5 6.1* 6.0*  ALBUMIN 2.3* 2.2* 2.1*   No results for input(s): LIPASE, AMYLASE in the last 168 hours. No results for input(s): AMMONIA in the last 168 hours.  ABG    Component Value Date/Time   PHART 7.417 11/15/2020 0946   PCO2ART 27.6 (L) 11/15/2020 0946   PO2ART 81 (L) 11/15/2020 0946   HCO3 18.0 (L) 11/15/2020 0946   TCO2 19 (L) 11/15/2020 0946   ACIDBASEDEF 6.0 (H) 11/15/2020 0946   O2SAT 97.0 11/15/2020 0946     Coagulation Profile: Recent Labs  Lab 11/15/20 0345  INR 1.2    Cardiac Enzymes:  No results for input(s): CKTOTAL, CKMB, CKMBINDEX, TROPONINI in the last 168 hours.  HbA1C: Hgb A1c MFr Bld  Date/Time Value Ref Range Status  11/08/2020 09:39 PM 5.7 (H) 4.8 - 5.6 % Final    Comment:    (NOTE) Pre diabetes:          5.7%-6.4%  Diabetes:              >6.4%  Glycemic control for   <7.0% adults with diabetes   02/02/2020 02:27 PM 5.5 4.8 - 5.6 % Final    Comment:             Prediabetes: 5.7 - 6.4          Diabetes: >6.4          Glycemic control for adults with diabetes: <7.0     CBG: Recent Labs  Lab 11/19/20 0016 11/19/20 0412 11/19/20 0414 11/19/20 0758 11/19/20 1104  GLUCAP 137* 11* 105* 121* 114*     Cheri Fowler MD [facility] Pager: [phone] Mobile: [phone]

## 2020-11-19 NOTE — Evaluation (Signed)
Clinical/Bedside Swallow Evaluation Patient Details  Name: Dimitrius Steedman MRN: [id number] Date of Birth: [birth year]  Today's Date: 11/19/2020 Time: SLP Start Time (ACUTE ONLY): 1114 SLP Stop Time (ACUTE ONLY): 1132 SLP Time Calculation (min) (ACUTE ONLY): 18 min  Past Medical History:  Past Medical History:  Diagnosis Date  . Diabetes mellitus without complication (HCC)   . Elevated random blood glucose level   . Foley catheter in place   . Hyperlipidemia   . Hypertension   . Schizophrenia [facility])    Past Surgical History:  Past Surgical History:  Procedure Laterality Date  . ABDOMINAL AORTOGRAM W/LOWER EXTREMITY N/A 05/17/2020   Procedure: ABDOMINAL AORTOGRAM W/LOWER EXTREMITY;  Surgeon: Yates Decamp, MD;  Location: [facility] INVASIVE CV LAB;  Service: Cardiovascular;  Laterality: N/A;  . AMPUTATION Right 05/19/2020   Procedure: RIGHT BELOW KNEE AMPUTATION;  Surgeon: Nadara Mustard, MD;  Location: [facility] OR;  Service: Orthopedics;  Laterality: Right;  . PERIPHERAL VASCULAR BALLOON ANGIOPLASTY  05/17/2020   Procedure: PERIPHERAL VASCULAR BALLOON ANGIOPLASTY;  Surgeon: Yates Decamp, MD;  Location: [facility] INVASIVE CV LAB;  Service: Cardiovascular;;  Right PT  . 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 He was admitted on 12/15 secondary to confusion and found to have obstructive uropathy and acute renal insufficiency. He developed ventricular fibrillation and cardiac arrest on 12/22. EEG 12/22: severe diffuse encephalopathy; no seizures. 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 No acute insult. Swallow screening was completed, but pt exhibited coughing with liquids and puree.   Assessment / Plan /  Recommendation Clinical Impression  Pt was seen for bedside swallow evaluation. Pt's voice was hoarse and hypophonic; repetition was frequently needed due to reduced speech intelligibility. Oral mechanism exam was limited due to pt's difficulty following commands; however, he was essentially edentulous with pieces of broken teeth still in place. He exhibited a wet vocal quality and coughing with thin liquids and nectar thick liquids. Oral holding of up to 30 seconds was noted with puree and thin liquids via tsp. Oral holding infrequently preceeded coughing but a palpable swallow was not observed. Aspiration of prematurely spilled boluses is therefore suspected. It is recommended that his NPO status be maintained with allowance of limited ice chips following oral care. SLP will follow to assess improvement in swallow function and readiness for a p.o. diet. SLP Visit Diagnosis: Dysphagia, unspecified (R13.10)    Aspiration Risk  Mild aspiration risk;Moderate aspiration risk    Diet Recommendation NPO;Ice chips PRN after oral care   Medication Administration: Via alternative means Postural Changes: Seated upright at 90 degrees    Other  Recommendations Oral Care Recommendations: Oral care BID   Follow up Recommendations  (tbd)      Frequency and Duration min 2x/week  2 weeks       Prognosis Prognosis for Safe Diet Advancement: Fair Barriers to Reach Goals: Severity of deficits      Swallow Study   General Date of Onset: 11/18/20 HPI: Pt is a 69 year old male with history of schizophrenia, hypertension, chronic kidney disease baseline creatinine around 1.8, 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 He was admitted on 12/15 secondary to confusion and found to have obstructive uropathy and acute renal insufficiency. He  developed ventricular fibrillation and cardiac arrest on 12/22. EEG 12/22: severe diffuse encephalopathy; no seizures.  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 No acute insult. Swallow screening was completed, but pt exhibited coughing with liquids and puree. Type of Study: Bedside Swallow Evaluation Previous Swallow Assessment: None Diet Prior to this Study: NPO Temperature Spikes Noted: No Respiratory Status: Room air History of Recent Intubation: Yes Length of Intubations (days): 3 days Date extubated: 11/18/20 Behavior/Cognition: Alert;Cooperative;Pleasant mood Oral Cavity Assessment: Within Functional Limits Oral Care Completed by SLP: No Oral Cavity - Dentition: Missing dentition Vision: Functional for self-feeding Self-Feeding Abilities: Needs assist Patient Positioning: Upright in bed Baseline Vocal Quality: Low vocal intensity;Hoarse Volitional Cough: Weak;Congested Volitional Swallow: Able to elicit    Oral/Motor/Sensory Function Overall Oral Motor/Sensory Function: Within functional limits   Ice Chips Ice chips: Impaired Pharyngeal Phase Impairments: Cough - Delayed (inconsistent)   Thin Liquid Thin Liquid: Impaired Presentation: Spoon;Cup;Straw Pharyngeal  Phase Impairments: Cough - Delayed;Wet Vocal Quality;Throat Clearing - Immediate    Nectar Thick Nectar Thick Liquid: Not tested   Honey Thick Honey Thick Liquid: Not tested   Puree Puree: Impaired Oral Phase Functional Implications: Oral holding Pharyngeal Phase Impairments: Cough - Delayed   Solid    Kahron Kauth I. Vear Clock, MS, CCC-SLP Acute Rehabilitation Services Office number [phone] Pager [phone] Solid: Not tested      Scheryl Marten 11/19/2020,11:49 AM

## 2020-11-19 NOTE — Progress Notes (Signed)
Lab staff have attempted x 2 to collect AM labs without success.  Elink aware.

## 2020-11-19 NOTE — Progress Notes (Signed)
Patient did not pass the nurse swallow eval at bedside this morning continues to be NPO.

## 2020-11-19 NOTE — Progress Notes (Signed)
eLink Physician-Brief Progress Note Patient Name: Ricky Hanson DOB: 1951-02-01 MRN: [id number]   Date of Service  11/19/2020  HPI/Events of Note  RN just wants to share information of lack ofIV access for CT that is ordered, Pt very very difficult stick even for labs, CT wants a new 20 G but IV team has tried multiple times. If you just want to read note and make a note of awareness then she will be fine with that  eICU Interventions  - MRI brain showed acute to subacute right frontal stroke EEG ruled out seizures Continue Keppra 500 mg twice daily CT head and neck is pending recommended by neurology for further work-up of stroke Continue aspirin and atorvastatin for secondary stroke prophylaxis TTE showed normal ejection fraction  Defer to Neurology for AM to eval further whether need a central line for this procedure.      Intervention Category Intermediate Interventions: Other:  Ranee Gosselin 11/19/2020, 9:12 PM

## 2020-11-19 NOTE — Progress Notes (Signed)
IV team has attempted IV access in order for patient to have CTA.  Day 2 of attempts by 2 different nurses with no success at establishing access.  Elink notified.  Per [facility] policy, CTA requires 20 gauge IV newly placed in [facility].  Unable to obtain at this time.

## 2020-11-20 ENCOUNTER — Inpatient Hospital Stay (HOSPITAL_COMMUNITY): Payer: Medicare Other

## 2020-11-20 DIAGNOSIS — I639 Cerebral infarction, unspecified: Secondary | ICD-10-CM

## 2020-11-20 LAB — GLUCOSE, CAPILLARY
Glucose-Capillary: 11 mg/dL — CL (ref 70–99)
Glucose-Capillary: 96 mg/dL (ref 70–99)
Glucose-Capillary: 124 mg/dL — ABNORMAL HIGH (ref 70–99)
Glucose-Capillary: 107 mg/dL — ABNORMAL HIGH (ref 70–99)
Glucose-Capillary: 107 mg/dL — ABNORMAL HIGH (ref 70–99)
Glucose-Capillary: 167 mg/dL — ABNORMAL HIGH (ref 70–99)
Glucose-Capillary: 160 mg/dL — ABNORMAL HIGH (ref 70–99)

## 2020-11-20 LAB — BASIC METABOLIC PANEL
Anion gap: 12 (ref 5–15)
BUN: 17 mg/dL (ref 8–23)
CO2: 19 mmol/L — ABNORMAL LOW (ref 22–32)
Calcium: 8.8 mg/dL — ABNORMAL LOW (ref 8.9–10.3)
Chloride: 116 mmol/L — ABNORMAL HIGH (ref 98–111)
Creatinine, Ser: 1.63 mg/dL — ABNORMAL HIGH (ref 0.61–1.24)
GFR, Estimated: 46 mL/min — ABNORMAL LOW (ref >60)
Glucose, Bld: 99 mg/dL (ref 70–99)
Potassium: 3.9 mmol/L (ref 3.5–5.1)
Sodium: 147 mmol/L — ABNORMAL HIGH (ref 135–145)

## 2020-11-20 LAB — CBC
HCT: 23 % — ABNORMAL LOW (ref 39.0–52.0)
Hemoglobin: 7.2 g/dL — ABNORMAL LOW (ref 13.0–17.0)
MCH: 26.4 pg (ref 26.0–34.0)
MCHC: 31.3 g/dL (ref 30.0–36.0)
MCV: 84.2 fL (ref 80.0–100.0)
Platelets: 154 10*3/uL (ref 150–400)
RBC: 2.73 MIL/uL — ABNORMAL LOW (ref 4.22–5.81)
RDW: 16.3 % — ABNORMAL HIGH (ref 11.5–15.5)
WBC: 6.1 10*3/uL (ref 4.0–10.5)
nRBC: 0.3 % — ABNORMAL HIGH (ref 0.0–0.2)

## 2020-11-20 LAB — PHOSPHORUS: Phosphorus: 3 mg/dL (ref 2.5–4.6)

## 2020-11-20 MED ORDER — ASPIRIN 81 MG PO CHEW
81.0000 mg | CHEWABLE_TABLET | Freq: Every day | ORAL | Status: DC
  Administered 2020-11-21 – 2020-11-22 (×2): 81 mg via NASOGASTRIC
  Filled 2020-11-20 (×3): qty 1

## 2020-11-20 MED ORDER — CARVEDILOL 3.125 MG PO TABS
3.1250 mg | ORAL_TABLET | Freq: Two times a day (BID) | ORAL | Status: DC
  Administered 2020-11-21 – 2020-11-30 (×13): 3.125 mg via NASOGASTRIC
  Filled 2020-11-20 (×18): qty 1

## 2020-11-20 MED ORDER — CARVEDILOL 3.125 MG PO TABS
3.1250 mg | ORAL_TABLET | Freq: Two times a day (BID) | ORAL | Status: DC
  Administered 2020-11-20: 16:00:00 3.125 mg via ORAL
  Filled 2020-11-20 (×2): qty 1

## 2020-11-20 MED ORDER — PROSOURCE TF PO LIQD
45.0000 mL | Freq: Three times a day (TID) | ORAL | Status: DC
  Administered 2020-11-20 – 2020-11-22 (×7): 45 mL
  Filled 2020-11-20 (×7): qty 45

## 2020-11-20 MED ORDER — LORAZEPAM 2 MG/ML IJ SOLN
1.0000 mg | Freq: Once | INTRAMUSCULAR | Status: AC
  Administered 2020-11-20: 13:00:00 1 mg via INTRAVENOUS
  Filled 2020-11-20: qty 1

## 2020-11-20 MED ORDER — ATORVASTATIN CALCIUM 40 MG PO TABS
80.0000 mg | ORAL_TABLET | Freq: Every day | ORAL | Status: DC
  Administered 2020-11-21: 10:00:00 80 mg via NASOGASTRIC
  Filled 2020-11-20: qty 2

## 2020-11-20 MED ORDER — OSMOLITE 1.5 CAL PO LIQD
1000.0000 mL | ORAL | Status: DC
  Administered 2020-11-20 – 2020-11-22 (×3): 1000 mL
  Filled 2020-11-20 (×5): qty 1000

## 2020-11-20 MED ORDER — MIRTAZAPINE 15 MG PO TBDP
15.0000 mg | ORAL_TABLET | Freq: Every day | ORAL | Status: DC
  Administered 2020-11-20 – 2020-11-29 (×9): 15 mg via NASOGASTRIC
  Filled 2020-11-20 (×11): qty 1

## 2020-11-20 MED ORDER — BENZTROPINE MESYLATE 1 MG PO TABS
1.0000 mg | ORAL_TABLET | Freq: Every day | ORAL | Status: DC
  Administered 2020-11-20 – 2020-12-02 (×11): 1 mg via NASOGASTRIC
  Filled 2020-11-20 (×11): qty 1

## 2020-11-20 NOTE — Progress Notes (Signed)
  Speech Language Pathology Treatment: Dysphagia  Patient Details Name: Ricky Hanson MRN: [id number] DOB: July 24, 1951 Today's Date: 11/20/2020 Time: 8366-2947 SLP Time Calculation (min) (ACUTE ONLY): 19 min  Assessment / Plan / Recommendation Clinical Impression  Pt has persistent dysphonia with vocal quality sounding very wet at baseline, concerning for decreased secretion management. He does not cough to command to attempt to clear it. Strong coughing episodes are noted after sips of water and bites of puree. Intermittent oral holding requires Mod cues to begin swallowing. Discussed with pt/son as well as MD and RN: pt is not yet ready for PO diet. MD ordering cortrak to allow for temporary alternative means of nutrition. SLP will continue to follow with potential need for instrumental testing. For now, could offer him a few single pieces of ice after completion of oral care with supervision from staff to ensure that he is swallowing and not orally holding.    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 He was admitted on 12/15 secondary to confusion and found to have obstructive uropathy and acute renal insufficiency. He developed ventricular fibrillation and cardiac arrest on 12/22. EEG 12/22: severe diffuse encephalopathy; no seizures. 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 No acute insult. Swallow screening was completed, but pt exhibited coughing with liquids and puree.      SLP Plan  Continue with current plan of care       Recommendations  Diet recommendations: NPO (few pieces of ice after oral care from RN) Medication Administration: Via alternative means                Oral Care Recommendations: Oral care  QID Follow up Recommendations: Inpatient Rehab SLP Visit Diagnosis: Dysphagia, unspecified (R13.10) Plan: Continue with current plan of care       GO                Mahala Menghini., M.A. CCC-SLP Acute Rehabilitation Services Pager [phone] Office [phone]  11/20/2020, 10:20 AM

## 2020-11-20 NOTE — Procedures (Signed)
Cortrak  Person Inserting Tube:  Promise Bushong, RD Tube Type:  Cortrak - 43 inches Tube Location:  Left nare Initial Placement:  Stomach Secured by: Bridle Technique Used to Measure Tube Placement:  Documented cm marking at nare/ corner of mouth Cortrak Secured At:  72 cm   Cortrak Tube Team Note:  Consult received to place a Cortrak feeding tube.   No x-ray is required. RN may begin using tube.    If the tube becomes dislodged please keep the tube and contact the Cortrak team at [email or web address] ([email or web address]) for replacement.  If after hours and replacement cannot be delayed, place a NG tube and confirm placement with an abdominal x-ray.    Lonni Dirden MS, RDN, LDN, CNSC Registered Dietitian III Clinical Nutrition RD Pager and On-Call Pager Number Located in Amion      

## 2020-11-20 NOTE — Progress Notes (Signed)
PROGRESS NOTE  Ricky Hanson  DOB: [birth year]  PCP: Ricky Register, MD ZDG:[id number]  DOA: 11/08/2020  LOS: 11 days   Chief Complaint  Patient presents with  . Altered Mental Status   Brief narrative: Ricky Hanson is a 69 y.o. male with PMH significant for DM2, HTN, HLD, schizophrenia. Patient was initially admitted to the hospital on 12/15 for AKI, obstructive uropathy.  On 12/22, patient developed V. fib and cardiac arrest, was pulseless for about 20 minutes, successfully resuscitated, intubated transferred to ICU. 12/24, MRI brain showed a small subacute to chronic white matter infarct in the subcortical right frontal lobe with no acute insult. Extubated when?? 10/27, transferred out of ICU to hospitalist service.  Subjective: Patient was seen and examined this morning in ICU. Propped up in bed. Alert, awake, oriented to place only. Son at bedside. Not in distress. Has bilateral wrist restraints currently untied but has intermittent delirium and restlessness. Discussed with neurology this morning for a follow-up request  Assessment/Plan: Acute hypoxic respiratory failure due to possible aspiration pneumonia Status post VT/VF cardiac arrest - ~19mins downtime -Currently breathing comfortably on room air -TTE showed normal ejection fraction. -Completed 5-day course of IV Zosyn on 12/26. 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 -Continue wrist restraints -Prior to admission, patient was on benztropine 1 mg nightly and long-acting Haldol subcutaneous monthly. -Continue to monitor mental status.  ?seizure -empirically started on Keppra.  EEG was negative.  Neurology to decide  Right frontal subacute to acute infarct -12/24, MRI brain showed a small subacute to chronic white matter infarct  in the subcortical right frontal lobe with no acute insult. -Patient was supposed to get CT angio of head and neck but per staff, it could not be done because of lack of proper IV access. -Discussed with neurology this morning.  Recommended MRA head and bilateral carotid duplex. -TTE showed normal ejection fraction without cardiac source of embolism. -Currently on aspirin and statin for secondary stroke prophylaxis. -TTE showed normal ejection fraction.  Acute kidney injury on CKD stage IIIa - baseline Scr ~1.5 - 1.8 -Serum creatinine has improved to the baseline Recent Labs    11/10/20 0319 11/11/20 0342 11/13/20 0419 11/14/20 0803 11/15/20 0345 11/15/20 1444 11/16/20 0410 11/17/20 0519 11/18/20 0031 11/20/20 0259  BUN 31* 19 21 18 16 21 22  30* 28* 17  CREATININE 1.46* 1.35* 1.69* 1.55* 1.64* 1.69* 1.59* 1.68* 1.55* 1.63*   Anemia of critical illness Patient received 1 unit PRBCs so far during this admission No signs of bleeding H&H is stable, transfuse if less than 7 Recent Labs    05/17/20 1205 05/18/20 0824 11/10/20 0319 11/11/20 0342 11/16/20 1329 11/17/20 0519 11/18/20 0031 11/19/20 1417 11/20/20 0259  HGB  --    < > 7.9*   < > 7.6* 7.4* 7.9* 8.3* 7.2*  MCV  --    < > 76.5*   < > 80.1 81.1 82.1 82.6 84.2  FERRITIN 625*  --  688*  --   --   --   --   --   --   TIBC 154*  --  174*  --   --   --   --   --   --   IRON 19*  --  42*  --   --   --   --   --   --    < > =  values in this interval not displayed.   Questionable history of diabetes mellitus -A1c 5.7 on 12/15.   -Not on diabetes medications at home. -Currently blood glucose level in normal range.  Continue sliding-scale insulin with Accu-Cheks.  If remains stable, will discontinue monitoring in next 24 to 48 hours. Recent Labs  Lab 11/19/20 1949 11/19/20 2345 11/20/20 0338 11/20/20 0814 11/20/20 1142  GLUCAP 108* 136* 96 124* 107*   Sinus tachycardia Essential hypertension -Home meds include  Amlodipine 10 mg daily, lisinopril 10 mg daily. -Currently not on blood pressure medicines.  Patient is running tachycardic> 100 for last 24 hours. -I will initiate Coreg 3.125 mg twice daily today.  Mobility: PT evaluation. Code Status:   Code Status: Full Code  Nutritional status: Body mass index is 25.22 kg/m. Nutrition Problem: Inadequate oral intake Etiology: dysphagia Signs/Symptoms: NPO status Diet Order            Diet NPO time specified  Diet effective now                 DVT prophylaxis: heparin injection 5,000 Units Start: 11/18/20 1400 SCDs Start: 11/15/20 0241   Antimicrobials:  Completed the course of Zosyn on 12/26 Fluid: None Consultants: Neurology Family Communication:  Son at bedside  Status is: Inpatient  Remains inpatient appropriate because:Altered mental status   Dispo: The patient is from: SNF              Anticipated d/c is to: SNF              Anticipated d/c date is: > 3 days              Patient currently is not medically stable to d/c.       Infusions:  . sodium chloride Stopped (11/15/20 0505)  . sodium chloride 10 mL/hr at 11/18/20 1900  . feeding supplement (OSMOLITE 1.5 CAL)    . levETIRAcetam 500 mg (11/20/20 0344)    Scheduled Meds: . sodium chloride   Intravenous Once  . sodium chloride   Intravenous Once  . aspirin  81 mg Oral Daily  . atorvastatin  80 mg Oral Daily  . benztropine  1 mg Oral QHS  . carvedilol  3.125 mg Oral BID WC  . chlorhexidine  15 mL Mouth Rinse BID  . Chlorhexidine Gluconate Cloth  6 each Topical Daily  . feeding supplement (PROSource TF)  45 mL Per Tube TID  . heparin injection (subcutaneous)  5,000 Units Subcutaneous Q8H  . insulin aspart  0-9 Units Subcutaneous Q4H  . mouth rinse  15 mL Mouth Rinse q12n4p  . mirtazapine  15 mg Oral QHS  . sodium chloride flush  10-40 mL Intracatheter Q12H    Antimicrobials: Anti-infectives (From admission, onward)   Start     Dose/Rate Route Frequency  Ordered Stop   11/15/20 1000  piperacillin-tazobactam (ZOSYN) IVPB 3.375 g        3.375 g 12.5 mL/hr over 240 Minutes Intravenous Every 8 hours 11/15/20 0855 11/19/20 2015   11/09/20 1000  ceFEPIme (MAXIPIME) 2 g in sodium chloride 0.9 % 100 mL IVPB        2 g 200 mL/hr over 30 Minutes Intravenous Every 12 hours 11/09/20 0754 11/14/20 2225   11/08/20 2200  ceFEPIme (MAXIPIME) 1 g in sodium chloride 0.9 % 100 mL IVPB  Status:  Discontinued        1 g 200 mL/hr over 30 Minutes Intravenous Every 24 hours 11/08/20 2147 11/09/20 0754  11/08/20 1800  cefTRIAXone (ROCEPHIN) 1 g in sodium chloride 0.9 % 100 mL IVPB        1 g 200 mL/hr over 30 Minutes Intravenous  Once 11/08/20 1745 11/08/20 1915      PRN meds: acetaminophen **OR** acetaminophen, alteplase, sodium chloride flush   Objective: Vitals:   11/20/20 1000 11/20/20 1137  BP: 120/73   Pulse:  (!) 109  Resp:  (!) 22  Temp:  98.1 F (36.7 C)  SpO2:  94%    Intake/Output Summary (Last 24 hours) at 11/20/2020 1315 Last data filed at 11/20/2020 1000 Gross per 24 hour  Intake 221.4 ml  Output 1300 ml  Net -1078.6 ml   Filed Weights   11/18/20 0500 11/19/20 0444 11/20/20 0513  Weight: 84.6 kg 86 kg 86.7 kg   Weight change: 0.7 kg Body mass index is 25.22 kg/m.   Physical Exam: General exam: Pleasant elderly African-American male.  Propped up in bed.  Not in distress Skin: No rashes, lesions or ulcers. HEENT: Atraumatic, normocephalic, no obvious bleeding Lungs: Clear to auscultation bilaterally CVS: Regular rate and rhythm, no murmur GI/Abd soft, nontender, nondistended, bowel sound present CNS: Alert, awake, oriented to place only Psychiatry: Depressed look Extremities: Previous right BKA status, left leg with chronic stasis changes  Data Review: I have personally reviewed the laboratory data and studies available.  Recent Labs  Lab 11/16/20 1329 11/17/20 0519 11/18/20 0031 11/19/20 1417 11/20/20 0259  WBC  17.8* 17.8* 15.4* 15.7* 6.1  HGB 7.6* 7.4* 7.9* 8.3* 7.2*  HCT 23.0* 23.1* 24.8* 25.2* 23.0*  MCV 80.1 81.1 82.1 82.6 84.2  PLT 260 251 242 249 154   Recent Labs  Lab 11/15/20 0345 11/15/20 0946 11/15/20 1008 11/15/20 1444 11/15/20 1703 11/16/20 0410 11/16/20 1707 11/17/20 0519 11/18/20 0031 11/20/20 0259  NA 138   < >  --  137  --  136  --  138 144 147*  K 3.9   < >  --  4.2  --  3.7  --  3.7 3.7 3.9  CL 111  --   --  112*  --  110  --  115* 114* 116*  CO2 15*  --   --  18*  --  16*  --  16* 19* 19*  GLUCOSE 197*  --   --  124*  --  154*  --  149* 131* 99  BUN 16  --   --  21  --  22  --  30* 28* 17  CREATININE 1.64*  --   --  1.69*  --  1.59*  --  1.68* 1.55* 1.63*  CALCIUM 8.2*  --   --  8.2*  --  8.0*  --  7.7* 8.1* 8.8*  MG 1.7  --  2.6*  --  2.4 2.3 2.3  --   --   --   PHOS  --    < > 2.6  --  2.4* 2.0* 3.3  --  2.2* 3.0   < > = values in this interval not displayed.    F/u labs ordered.  Signed, Lorin Glass, MD [facility] 11/20/2020

## 2020-11-20 NOTE — Progress Notes (Signed)
Carotid completed   Please see CV Proc for preliminary results.   Chayne Baumgart, RVT  

## 2020-11-20 NOTE — Progress Notes (Signed)
Nutrition Follow-up  DOCUMENTATION CODES:   Not applicable  INTERVENTION:   Initiate tube feeds via Cortrak: - Osmolite 1.5 @ 60 ml/hr (1440 ml/day) - ProSource TF 45 ml TID  Tube feeding regimen provides 2280 kcal, 123 grams of protein, and 1097 ml of H2O.   NUTRITION DIAGNOSIS:   Inadequate oral intake related to dysphagia as evidenced by NPO status.  Ongoing  GOAL:   Patient will meet greater than or equal to 90% of their needs  Met via TF  MONITOR:   Diet advancement,Labs,Weight trends,TF tolerance,I & O's  REASON FOR ASSESSMENT:   Consult Enteral/tube feeding initiation and management  ASSESSMENT:   69 yo male admitted with obstructed uropathy, acute renal insufficiency, UTI. Intubated and transferred to the ICU early 12/22 s/p V fib cardiac arrest. PMH includes DM, HLD, HTN, schizophrenia, [name].  12/25 - extubated 12/27 - Cortrak placed, tip in stomach per Cortrak team  Discussed pt with RN and during ICU rounds.  Pt remains NPO after extubation per SLP recommendations. Cortrak placed today for initiation of enteral nutrition.  Attempted to speak with pt at bedside. Pt unable to answer RN questions at this time but nodded when asked if RD could complete NFPE.  Admit weight: 77.9 kg Current weight: 86.7 kg  Weights trending up over the last week. Pt with mild generalized edema. Will utilize admit weight of 77.9 kg as EDW.  Medications reviewed and include: SSI q 4 hours, remeron daily, IV keppra  Labs reviewed: sodium 147, creatinine 1.63, hemoglobin 7.2 CBG's: 96-136 x 24 hours  UOP: 1350 ml x 24 hours Stool: 50 ml via rectal tube x 24 hours I/O's: -3.6 L since admit  NUTRITION - FOCUSED PHYSICAL EXAM:  Flowsheet Row Most Recent Value  Orbital Region No depletion  Upper Arm Region No depletion  Thoracic and Lumbar Region No depletion  Buccal Region No depletion  Temple Region No depletion  Clavicle Bone Region Mild depletion  Clavicle and  Acromion Bone Region Mild depletion  Scapular Bone Region No depletion  Dorsal Hand Unable to assess  Patellar Region No depletion  Anterior Thigh Region Mild depletion  Posterior Calf Region Mild depletion  Edema (RD Assessment) Mild  Hair Reviewed  Eyes Reviewed  Mouth Reviewed  Skin Reviewed  Nails Reviewed       Diet Order:   Diet Order            Diet NPO time specified  Diet effective now                 EDUCATION NEEDS:   No education needs have been identified at this time  Skin:  Skin Assessment: Reviewed RN Assessment  Last BM:  11/20/20 rectal tube  Height:   Ht Readings from Last 1 Encounters:  11/09/20 $RemoveB'6\' 1"'ScLFRREU$  (1.854 m)    Weight:   Wt Readings from Last 1 Encounters:  11/20/20 86.7 kg    Ideal Body Weight:  78.3 kg  BMI:  Body mass index is 25.22 kg/m.  Estimated Nutritional Needs:   Kcal:  2200-2400  Protein:  120-135 gm  Fluid:  >/= 2 L    Gustavus Bryant, MS, RD, LDN Inpatient Clinical Dietitian Please see AMiON for contact information.

## 2020-11-20 NOTE — Progress Notes (Signed)
PT Cancellation Note  Patient Details Name: Ricky Hanson MRN: [id number] DOB: 1951/04/15   Cancelled Treatment:    Reason Eval/Treat Not Completed: Patient at procedure or test/unavailable. Per RN, pt awaiting transport to MRI. PT to re-attempt as time allows.   Ilda Foil 11/20/2020, 1:15 PM   Aida Raider, PT  Office # [phone] Pager [phone]

## 2020-11-20 NOTE — Progress Notes (Deleted)
Pt transported from 2M09 to MRI and back with no complications.  

## 2020-11-21 LAB — COMPREHENSIVE METABOLIC PANEL
ALT: 38 U/L (ref 0–44)
AST: 91 U/L — ABNORMAL HIGH (ref 15–41)
Albumin: 2.2 g/dL — ABNORMAL LOW (ref 3.5–5.0)
Alkaline Phosphatase: 108 U/L (ref 38–126)
Anion gap: 13 (ref 5–15)
BUN: 27 mg/dL — ABNORMAL HIGH (ref 8–23)
CO2: 17 mmol/L — ABNORMAL LOW (ref 22–32)
Calcium: 8.4 mg/dL — ABNORMAL LOW (ref 8.9–10.3)
Chloride: 115 mmol/L — ABNORMAL HIGH (ref 98–111)
Creatinine, Ser: 1.69 mg/dL — ABNORMAL HIGH (ref 0.61–1.24)
GFR, Estimated: 44 mL/min — ABNORMAL LOW (ref >60)
Glucose, Bld: 257 mg/dL — ABNORMAL HIGH (ref 70–99)
Potassium: 4.1 mmol/L (ref 3.5–5.1)
Sodium: 145 mmol/L (ref 135–145)
Total Bilirubin: 0.6 mg/dL (ref 0.3–1.2)
Total Protein: 6.1 g/dL — ABNORMAL LOW (ref 6.5–8.1)

## 2020-11-21 LAB — GLUCOSE, CAPILLARY
Glucose-Capillary: 192 mg/dL — ABNORMAL HIGH (ref 70–99)
Glucose-Capillary: 258 mg/dL — ABNORMAL HIGH (ref 70–99)
Glucose-Capillary: 217 mg/dL — ABNORMAL HIGH (ref 70–99)
Glucose-Capillary: 243 mg/dL — ABNORMAL HIGH (ref 70–99)
Glucose-Capillary: 154 mg/dL — ABNORMAL HIGH (ref 70–99)
Glucose-Capillary: 148 mg/dL — ABNORMAL HIGH (ref 70–99)

## 2020-11-21 LAB — CBC
HCT: 27.4 % — ABNORMAL LOW (ref 39.0–52.0)
Hemoglobin: 8.9 g/dL — ABNORMAL LOW (ref 13.0–17.0)
MCH: 27.5 pg (ref 26.0–34.0)
MCHC: 32.5 g/dL (ref 30.0–36.0)
MCV: 84.6 fL (ref 80.0–100.0)
Platelets: 266 10*3/uL (ref 150–400)
RBC: 3.24 MIL/uL — ABNORMAL LOW (ref 4.22–5.81)
RDW: 17 % — ABNORMAL HIGH (ref 11.5–15.5)
WBC: 16 10*3/uL — ABNORMAL HIGH (ref 4.0–10.5)
nRBC: 0.7 % — ABNORMAL HIGH (ref 0.0–0.2)

## 2020-11-21 LAB — MAGNESIUM: Magnesium: 2.1 mg/dL (ref 1.7–2.4)

## 2020-11-21 LAB — PHOSPHORUS: Phosphorus: 3.1 mg/dL (ref 2.5–4.6)

## 2020-11-21 MED ORDER — ATORVASTATIN CALCIUM 40 MG PO TABS
40.0000 mg | ORAL_TABLET | Freq: Every day | ORAL | Status: DC
  Administered 2020-11-22 – 2020-11-30 (×7): 40 mg via NASOGASTRIC
  Filled 2020-11-21 (×9): qty 1

## 2020-11-21 MED ORDER — INSULIN GLARGINE 100 UNIT/ML ~~LOC~~ SOLN
5.0000 [IU] | Freq: Every day | SUBCUTANEOUS | Status: DC
  Administered 2020-11-21 – 2020-11-22 (×2): 5 [IU] via SUBCUTANEOUS
  Filled 2020-11-21 (×3): qty 0.05

## 2020-11-21 NOTE — Progress Notes (Signed)
Physical Therapy Re-Evaluation and Treatment Patient Details Name: Ricky Hanson MRN: [id number] DOB: Jul 13, 1951 Today's Date: 11/21/2020    History of Present Illness Pt is a 69 y.o. M admitted with AKI stage III with mild hyperkalemia from obstruction due to malplaced Foley catheter. 11/26/2023 pt coded and intubated. Extubated 12/25.  PMHx of schizophrenia, HTN, CKD, [name].    PT Comments    PT re-evaluation completed. Pt suffered in hospital cardiac arrest 11/26/2023. He was intubated November 26, 2011/25. He remains in ICU. Today pt very lethargic with difficulty participating in therapy. RN reports pt was awake and active most of the night. Bed transitioned to chair position. LE exercises performed with little active participation from pt. He required max assist for repositioning in bed. Suspect pt will demonstrate better participation when not so sleepy. Goals reviewed and downgraded due to decline in mobility and med status.    Follow Up Recommendations  SNF     Equipment Recommendations  None recommended by PT    Recommendations for Other Services       Precautions / Restrictions Precautions Precautions: Fall;Other (comment) Precaution Comments: cortrak, flexiseal    Mobility  Bed Mobility               General bed mobility comments: Bed egress for chair position. Max assist to reposition in bed.  Transfers                    Ambulation/Gait                 Stairs             Wheelchair Mobility    Modified Rankin (Stroke Patients Only)       Balance                                            Cognition Arousal/Alertness: Lethargic Behavior During Therapy: Flat affect Overall Cognitive Status: Difficult to assess Area of Impairment: Following commands                       Following Commands: Follows one step commands inconsistently       General Comments: Very sleepy. Minimal verbal interaction. Following  simple commands inconsistently. Requiring constant cueing to stay awake.      Exercises General Exercises - Lower Extremity Ankle Circles/Pumps: Left;AAROM;5 reps Long Arc Quad: AAROM;Right;Left;5 reps Hip ABduction/ADduction: AAROM;Right;Left;5 reps Hip Flexion/Marching: AAROM;Right;Left;5 reps    General Comments        Pertinent Vitals/Pain Pain Assessment: Faces Faces Pain Scale: No hurt    Home Living                      Prior Function            PT Goals (current goals can now be found in the care plan section) Acute Rehab PT Goals Patient Stated Goal: not stated PT Goal Formulation: Patient unable to participate in goal setting Time For Goal Achievement: 12/05/20 Potential to Achieve Goals: Fair Progress towards PT goals: Goals downgraded-see care plan    Frequency    Min 2X/week      PT Plan Current plan remains appropriate    Co-evaluation              AM-PAC PT "6 Clicks" Mobility   Outcome Measure  Help needed  turning from your back to your side while in a flat bed without using bedrails?: A Little Help needed moving from lying on your back to sitting on the side of a flat bed without using bedrails?: A Lot Help needed moving to and from a bed to a chair (including a wheelchair)?: A Lot Help needed standing up from a chair using your arms (e.g., wheelchair or bedside chair)?: A Lot Help needed to walk in hospital room?: Total Help needed climbing 3-5 steps with a railing? : Total 6 Click Score: 11    End of Session   Activity Tolerance: Patient limited by lethargy Patient left: in bed;with call bell/phone within reach Nurse Communication: Mobility status;Other (comment) (unable to set bed alarm) PT Visit Diagnosis: Difficulty in walking, not elsewhere classified (R26.2)     Time: 4098-1191 PT Time Calculation (min) (ACUTE ONLY): 14 min  Charges:                        Aida Raider, PT  Office # [phone] Pager  [phone]    Ilda Foil 11/21/2020, 11:23 AM

## 2020-11-21 NOTE — Progress Notes (Signed)
Subjective: No acute events overnight.  No new concerns.  ROS: Unable to obtain due to poor mental status  Examination  Vital signs in last 24 hours: Temp:  [98.1 F (36.7 C)-99.4 F (37.4 C)] 98.2 F (36.8 C) (12/28 0757) Pulse Rate:  [97-111] 100 (12/28 0802) Resp:  [19-29] 24 (12/28 0800) BP: (108-125)/(64-76) 116/69 (12/28 0802) SpO2:  [91 %-100 %] 100 % (12/28 0800)  General: lying in bed, not in apparent distress CVS: pulse-normal rate and rhythm RS: breathing comfortably, coarse breath sounds bilaterally Extremities: normal, right below-knee amputation  Neuro: Awake, alert, follows simple commands like sticking out his tongue and lifting his arm but not consistently, pupils equally round and reactive, extra movements intact, able to track examiner in the room, blinks to threat bilaterally, no apparent facial asymmetry, antigravity strength in bilateral upper extremities, withdraws to noxious stimuli in left lower extremity, antigravity strength in right lower extremity  Basic Metabolic Panel: Recent Labs  Lab 11/15/20 1008 11/15/20 1444 11/15/20 1703 11/16/20 0410 11/16/20 1707 11/17/20 0519 11/18/20 0031 11/20/20 0259 11/21/20 0850  NA  --    < >  --  136  --  138 144 147* 145  K  --    < >  --  3.7  --  3.7 3.7 3.9 4.1  CL  --    < >  --  110  --  115* 114* 116* 115*  CO2  --    < >  --  16*  --  16* 19* 19* 17*  GLUCOSE  --    < >  --  154*  --  149* 131* 99 257*  BUN  --    < >  --  22  --  30* 28* 17 27*  CREATININE  --    < >  --  1.59*  --  1.68* 1.55* 1.63* 1.69*  CALCIUM  --    < >  --  8.0*  --  7.7* 8.1* 8.8* 8.4*  MG 2.6*  --  2.4 2.3 2.3  --   --   --  2.1  PHOS 2.6  --  2.4* 2.0* 3.3  --  2.2* 3.0 3.1   < > = values in this interval not displayed.    CBC: Recent Labs  Lab 11/17/20 0519 11/18/20 0031 11/19/20 1417 11/20/20 0259 11/21/20 0151  WBC 17.8* 15.4* 15.7* 6.1 16.0*  HGB 7.4* 7.9* 8.3* 7.2* 8.9*  HCT 23.1* 24.8* 25.2* 23.0* 27.4*   MCV 81.1 82.1 82.6 84.2 84.6  PLT 251 242 249 154 266     Coagulation Studies: No results for input(s): LABPROT, INR in the last 72 hours.  Imaging MRA brain without contrast: No significant intracranial stenosis. Carotid Doppler: No hemodynamically significant stenosis in carotids, vertebrals and subclavian's.  ASSESSMENT AND PLAN: 69yo M s/p in hospital cardiac arrest on 11/15/2020 , ROSC after 20 mins.   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  Patient is outside of TPA window. -TTE done on 11/15/2020 showed normal ejection fraction of 55 to 60% with impaired relaxation (grade 1 diastolic dysfunction). -TSH on 12/16 was 0.597.  A1c on 12/15 was 5.7, LDL 64   Recommendations: -Continue Keppra 500 mg twice daily. If patient has any clinical seizures, we can increase keppra to 1000mg  BID.  -The lacunar infarct is relatively small and unlikely contributing to  current mental status. -In-hospital cardiac arrest with ROSC in 20 minutes patient's neurologic exam (awake, following commands intermittently), EEG and MRI findings are suggestive of possibly mild anoxic/hypoxic brain injury with relatively good chances of neurologic recovery -Continue aspirin for secondary stroke prevention.  Started atorvastatin 40 mg for secondary stroke prevention. - seizure precautions - PRN IV ativan 2mg  for clinical sz - management of rest of comorbidities per primary team -Follow-up with neurology in 8 to 10 weeks after discharge.  If patient remains seizure-free, can consider weaning off Keppra after repeat EEG in 3 to 4 months.  Thank you for allowing to participate in the care of this patient.  Neurology will sign off.  Please call for any further questions.  I have spent a total of 35  minuteswith the patient reviewing hospitalnotes,  test results,  labs and examining the patient as well as establishing an assessment and plan.>50% of time was spent in direct patient care.   [place] [facility] For questions after 5pm please refer to AMION to reach the Neurologist on call

## 2020-11-21 NOTE — Progress Notes (Signed)
PROGRESS NOTE  Ricky Hanson  DOB: [birth year]  PCP: Hoy Register, MD MBE:[id number]  DOA: 11/08/2020  LOS: 12 days   Chief Complaint  Patient presents with  . Altered Mental Status   Brief narrative: Ricky Hanson is a 69 y.o. male with PMH significant for DM2, HTN, HLD, schizophrenia. Patient was initially admitted to the hospital on 12/15 for AKI, obstructive uropathy.  On 12/22, patient developed V. fib and cardiac arrest, was pulseless for about 20 minutes, successfully resuscitated, intubated transferred to ICU. 12/24, MRI brain showed a small subacute to chronic white matter infarct in the subcortical right frontal lobe with no acute insult. Extubated when?? 10/27, transferred out of ICU to hospitalist service.  Subjective: Patient was seen and examined this morning in ICU. Lying on bed. Opens eyes on verbal command. Unable to follow commands for me. No family at bedside today.  Assessment/Plan: Acute hypoxic respiratory failure due to possible aspiration pneumonia Status post VT/VF cardiac arrest - ~72mins downtime -Currently breathing comfortably on room air -TTE showed normal ejection fraction. -Completed 5-day course of IV Zosyn on 12/26.  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 -Continue wrist restraints -Prior to admission, patient was on benztropine 1 mg nightly and long-acting Haldol subcutaneous monthly. -Continue to monitor mental status. -Neurology recommended continuation of Keppra 500 mg twice daily. If patient has clinical seizure, recommendation is to increase it to 1000 mg daily. Patient is to follow-up with neurology as an outpatient in 8 to 10 weeks of discharge. If remains seizure-free, can consider repeat EEG and weaning off Keppra.  Right frontal subacute to acute  infarct -12/24, MRI brain showed a small subacute to chronic white matter infarct in the subcortical right frontal lobe with no acute insult. -MRA head and bilateral carotid duplex unremarkable. -Discussed with neurology. -TTE showed normal ejection fraction without cardiac source of embolism. -Currently on aspirin and statin for secondary stroke prophylaxis. -TTE showed normal ejection fraction.  Acute kidney injury on CKD stage IIIa  -baseline Scr ~1.5 - 1.8 -Serum creatinine has improved to the baseline. Continue to monitor Recent Labs    11/11/20 0342 11/13/20 0419 11/14/20 0803 11/15/20 0345 11/15/20 1444 11/16/20 0410 11/17/20 0519 11/18/20 0031 11/20/20 0259 11/21/20 0850  BUN 19 21 18 16 21 22  30* 28* 17 27*  CREATININE 1.35* 1.69* 1.55* 1.64* 1.69* 1.59* 1.68* 1.55* 1.63* 1.69*   Anemia of critical illness Patient received 1 unit PRBCs so far during this admission No signs of bleeding H&H is stable, transfuse if less than 7 Recent Labs    05/17/20 1205 05/18/20 0824 11/10/20 0319 11/11/20 0342 11/17/20 0519 11/18/20 0031 11/19/20 1417 11/20/20 0259 11/21/20 0151  HGB  --    < > 7.9*   < > 7.4* 7.9* 8.3* 7.2* 8.9*  MCV  --    < > 76.5*   < > 81.1 82.1 82.6 84.2 84.6  FERRITIN 625*  --  688*  --   --   --   --   --   --   TIBC 154*  --  174*  --   --   --   --   --   --   IRON 19*  --  42*  --   --   --   --   --   --    < > = values in this interval not displayed.  Questionable history of diabetes mellitus -A1c 5.7 on 12/15.   -Not on diabetes medications at home. -Blood sugar level is over 200 today. We will add Lantus 5 units daily. Continue sliding scale insulin with Accu-Cheks. Recent Labs  Lab 11/20/20 1947 11/20/20 2316 11/21/20 0329 11/21/20 0755 11/21/20 1122  GLUCAP 167* 160* 192* 258* 217*   Sinus tachycardia Essential hypertension -Home meds include Amlodipine 10 mg daily, lisinopril 10 mg daily. -Currently heart rate is 90 and  blood pressure is in normal range. Patient is on Coreg 3.125 mg twice daily. -Continue the same.   Mobility: PT evaluation recommended SNF. Code Status:   Code Status: Full Code  Nutritional status: Body mass index is 25.22 kg/m. Nutrition Problem: Inadequate oral intake Etiology: dysphagia Signs/Symptoms: NPO status Diet Order            Diet NPO time specified  Diet effective now                 DVT prophylaxis: heparin injection 5,000 Units Start: 11/18/20 1400 SCDs Start: 11/15/20 0241   Antimicrobials:  Completed the course of Zosyn on 12/26 Fluid: None Consultants: Neurology Family Communication:  Son not at bedside today  Status is: Inpatient  Remains inpatient appropriate because:Altered mental status   Dispo: The patient is from: SNF              Anticipated d/c is to: SNF              Anticipated d/c date is: > 3 days              Patient currently is not medically stable to d/c.       Infusions:  . sodium chloride Stopped (11/15/20 0505)  . sodium chloride 10 mL/hr at 11/18/20 1900  . feeding supplement (OSMOLITE 1.5 CAL) 1,000 mL (11/20/20 1435)  . levETIRAcetam Stopped (11/21/20 0340)    Scheduled Meds: . sodium chloride   Intravenous Once  . sodium chloride   Intravenous Once  . aspirin  81 mg Per NG tube Daily  . [START ON 11/22/2020] atorvastatin  40 mg Per NG tube Daily  . benztropine  1 mg Per NG tube QHS  . carvedilol  3.125 mg Per NG tube BID WC  . chlorhexidine  15 mL Mouth Rinse BID  . Chlorhexidine Gluconate Cloth  6 each Topical Daily  . feeding supplement (PROSource TF)  45 mL Per Tube TID  . heparin injection (subcutaneous)  5,000 Units Subcutaneous Q8H  . insulin aspart  0-9 Units Subcutaneous Q4H  . insulin glargine  5 Units Subcutaneous Daily  . mouth rinse  15 mL Mouth Rinse q12n4p  . mirtazapine  15 mg Per NG tube QHS  . sodium chloride flush  10-40 mL Intracatheter Q12H    Antimicrobials: Anti-infectives (From  admission, onward)   Start     Dose/Rate Route Frequency Ordered Stop   11/15/20 1000  piperacillin-tazobactam (ZOSYN) IVPB 3.375 g        3.375 g 12.5 mL/hr over 240 Minutes Intravenous Every 8 hours 11/15/20 0855 11/19/20 2015   11/09/20 1000  ceFEPIme (MAXIPIME) 2 g in sodium chloride 0.9 % 100 mL IVPB        2 g 200 mL/hr over 30 Minutes Intravenous Every 12 hours 11/09/20 0754 11/14/20 2225   11/08/20 2200  ceFEPIme (MAXIPIME) 1 g in sodium chloride 0.9 % 100 mL IVPB  Status:  Discontinued        1 g 200  mL/hr over 30 Minutes Intravenous Every 24 hours 11/08/20 2147 11/09/20 0754   11/08/20 1800  cefTRIAXone (ROCEPHIN) 1 g in sodium chloride 0.9 % 100 mL IVPB        1 g 200 mL/hr over 30 Minutes Intravenous  Once 11/08/20 1745 11/08/20 1915      PRN meds: acetaminophen **OR** acetaminophen, alteplase, sodium chloride flush   Objective: Vitals:   11/21/20 1124 11/21/20 1200  BP:  113/70  Pulse:  89  Resp:  20  Temp: 98.5 F (36.9 C)   SpO2:  99%    Intake/Output Summary (Last 24 hours) at 11/21/2020 1340 Last data filed at 11/21/2020 1200 Gross per 24 hour  Intake 2004.62 ml  Output 785 ml  Net 1219.62 ml   Filed Weights   11/18/20 0500 11/19/20 0444 11/20/20 0513  Weight: 84.6 kg 86 kg 86.7 kg   Weight change:  Body mass index is 25.22 kg/m.   Physical Exam: General exam: Pleasant elderly African-American male.  Propped up in bed.  Not in distress Skin: No rashes, lesions or ulcers. HEENT: Atraumatic, normocephalic, no obvious bleeding Lungs: Clear to auscultation bilaterally CVS: Regular rate and rhythm, no murmur GI/Abd soft, nontender, nondistended, bowel sound present CNS: Remains confused. Opens eyes on verbal command. Unable to follow commands for me today. Psychiatry: Depressed look Extremities: Previous right BKA status, left leg with chronic stasis changes  Data Review: I have personally reviewed the laboratory data and studies  available.  Recent Labs  Lab 11/17/20 0519 11/18/20 0031 11/19/20 1417 11/20/20 0259 11/21/20 0151  WBC 17.8* 15.4* 15.7* 6.1 16.0*  HGB 7.4* 7.9* 8.3* 7.2* 8.9*  HCT 23.1* 24.8* 25.2* 23.0* 27.4*  MCV 81.1 82.1 82.6 84.2 84.6  PLT 251 242 249 154 266   Recent Labs  Lab 11/15/20 1008 11/15/20 1444 11/15/20 1703 11/16/20 0410 11/16/20 1707 11/17/20 0519 11/18/20 0031 11/20/20 0259 11/21/20 0850  NA  --    < >  --  136  --  138 144 147* 145  K  --    < >  --  3.7  --  3.7 3.7 3.9 4.1  CL  --    < >  --  110  --  115* 114* 116* 115*  CO2  --    < >  --  16*  --  16* 19* 19* 17*  GLUCOSE  --    < >  --  154*  --  149* 131* 99 257*  BUN  --    < >  --  22  --  30* 28* 17 27*  CREATININE  --    < >  --  1.59*  --  1.68* 1.55* 1.63* 1.69*  CALCIUM  --    < >  --  8.0*  --  7.7* 8.1* 8.8* 8.4*  MG 2.6*  --  2.4 2.3 2.3  --   --   --  2.1  PHOS 2.6  --  2.4* 2.0* 3.3  --  2.2* 3.0 3.1   < > = values in this interval not displayed.    F/u labs ordered.  Signed, Lorin Glass, MD [facility] 11/21/2020

## 2020-11-21 NOTE — Progress Notes (Signed)
Inpatient Diabetes Program Recommendations  AACE/ADA: New Consensus Statement on Inpatient Glycemic Control (2015)  Target Ranges:  Prepandial:   less than 140 mg/dL      Peak postprandial:   less than 180 mg/dL (1-2 hours)      Critically ill patients:  140 - 180 mg/dL   Lab Results  Component Value Date   GLUCAP 258 (H) 11/21/2020   HGBA1C 5.7 (H) 11/08/2020    Review of Glycemic Control Results for Ricky Hanson, Ricky Hanson (MRN [id number]) as of 11/21/2020 10:43  Ref. Range 11/21/2020 03:29 11/21/2020 07:55  Glucose-Capillary Latest Ref Range: 70 - 99 mg/dL 400 (H) 867 (H)   Diabetes history: PreDM? Outpatient Diabetes medications: none Current orders for Inpatient glycemic control: Novolog 0-9 units Q4H Osmolite 1.5 @ 60 ml/hr  Inpatient Diabetes Program Recommendations:     Consider adding Novolog 3 units Q4H for tube feed coverage (to be stopped or held in the event tube feeds are stopped).   Thanks, Lujean Rave, MSN, RNC-OB Diabetes Coordinator [phone] (8a-5p)

## 2020-11-22 ENCOUNTER — Inpatient Hospital Stay (HOSPITAL_COMMUNITY): Payer: Medicare Other

## 2020-11-22 DIAGNOSIS — N179 Acute kidney failure, unspecified: Secondary | ICD-10-CM | POA: Diagnosis not present

## 2020-11-22 LAB — CBC
HCT: 28.9 % — ABNORMAL LOW (ref 39.0–52.0)
Hemoglobin: 9.4 g/dL — ABNORMAL LOW (ref 13.0–17.0)
MCH: 27.8 pg (ref 26.0–34.0)
MCHC: 32.5 g/dL (ref 30.0–36.0)
MCV: 85.5 fL (ref 80.0–100.0)
Platelets: 272 10*3/uL (ref 150–400)
RBC: 3.38 MIL/uL — ABNORMAL LOW (ref 4.22–5.81)
RDW: 17.2 % — ABNORMAL HIGH (ref 11.5–15.5)
WBC: 14.5 10*3/uL — ABNORMAL HIGH (ref 4.0–10.5)
nRBC: 2.1 % — ABNORMAL HIGH (ref 0.0–0.2)

## 2020-11-22 LAB — BASIC METABOLIC PANEL
Anion gap: 8 (ref 5–15)
BUN: 35 mg/dL — ABNORMAL HIGH (ref 8–23)
CO2: 22 mmol/L (ref 22–32)
Calcium: 8.5 mg/dL — ABNORMAL LOW (ref 8.9–10.3)
Chloride: 118 mmol/L — ABNORMAL HIGH (ref 98–111)
Creatinine, Ser: 1.66 mg/dL — ABNORMAL HIGH (ref 0.61–1.24)
GFR, Estimated: 45 mL/min — ABNORMAL LOW (ref >60)
Glucose, Bld: 169 mg/dL — ABNORMAL HIGH (ref 70–99)
Potassium: 4.8 mmol/L (ref 3.5–5.1)
Sodium: 148 mmol/L — ABNORMAL HIGH (ref 135–145)

## 2020-11-22 LAB — GLUCOSE, CAPILLARY
Glucose-Capillary: 235 mg/dL — ABNORMAL HIGH (ref 70–99)
Glucose-Capillary: 244 mg/dL — ABNORMAL HIGH (ref 70–99)
Glucose-Capillary: 245 mg/dL — ABNORMAL HIGH (ref 70–99)
Glucose-Capillary: 171 mg/dL — ABNORMAL HIGH (ref 70–99)
Glucose-Capillary: 230 mg/dL — ABNORMAL HIGH (ref 70–99)

## 2020-11-22 MED ORDER — WHITE PETROLATUM EX OINT
TOPICAL_OINTMENT | CUTANEOUS | Status: AC
  Administered 2020-11-22: 0.2
  Filled 2020-11-22: qty 28.35

## 2020-11-22 MED ORDER — WHITE PETROLATUM EX OINT
TOPICAL_OINTMENT | CUTANEOUS | Status: AC
  Filled 2020-11-22: qty 28.35

## 2020-11-22 NOTE — Progress Notes (Signed)
PROGRESS NOTE  Ricky Hanson CLE:[id number] DOB: Oct 08, 1951 DOA: 11/08/2020 PCP: Hoy Register, MD  HPI/Recap of past 24 hours: Ricky Hanson is a 69 y.o. male with PMH significant for DM2, HTN, HLD, schizophrenia. Patient was initially admitted to the hospital on 12/15 for AKI, obstructive uropathy.  On 12/22, patient developed V. fib and cardiac arrest, was pulseless for about 20 minutes, successfully resuscitated, intubated transferred to ICU. 12/24, MRI brain showed a small subacute to chronic white matter infarct in the subcortical right frontal lobe with no acute insult. Extubated when?? 12/27, transferred out of ICU to hospitalist service.  11/22/20: Seen and examined in his room in the ICU prior to transfer to 2 W.  He is nonverbal and minimally interactive however follows commands.  No apparent distress.  Assessment/Plan: Principal Problem:   ARF (acute renal failure) (HCC) Active Problems:   Chronic indwelling Foley catheter   Peripheral vascular disease (HCC)   CKD (chronic kidney disease), stage III (HCC)   Essential hypertension   AKI (acute kidney injury) (HCC)   Cardiac arrest (HCC)  Acute hypoxic respiratory failure due to possible aspiration pneumonia Status post VT/VF cardiac arrest - ~12mins downtime -Currently breathing comfortably on room air -TTE showed normal ejection fraction. -Completed 5-day course of IV Zosyn on 12/26.  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 -Continue wrist restraints -Prior to admission, patient was on benztropine 1 mg nightly and long-acting Haldol subcutaneous monthly. -Continue to monitor mental status. -Neurology recommended continuation of Keppra 500 mg twice daily. If patient has clinical seizure, recommendation is to increase it  to 1000 mg daily. Patient is to follow-up with neurology as an outpatient in 8 to 10 weeks of discharge. If remains seizure-free, can consider repeat EEG and weaning off Keppra.  Right frontal subacute to acute infarct -12/24, MRI brain showed a small subacute to chronic white matter infarct in the subcortical right frontal lobe with no acute insult. -MRA head and bilateral carotid duplex unremarkable. -Discussed with neurology. -TTE showed normal ejection fraction without cardiac source of embolism. -Currently on aspirin and statin for secondary stroke prophylaxis. -TTE showed normal ejection fraction.  Acute kidney injury on CKD stage IIIa  -baseline Scr ~1.5- 1.8 Continue to monitor                                                        Anemia of critical illness Patient received 1 unit PRBCs so far during this admission No signs of bleeding H&H is stable, transfuse if less than 7 Recent Labs (within last 365 days)             Recent Labs    05/17/20 1205 05/18/20 0824 11/10/20 0319 11/11/20 0342 11/17/20 0519 11/18/20 0031 11/19/20 1417 11/20/20 0259 11/21/20 0151  HGB  --    < > 7.9*   < > 7.4* 7.9* 8.3* 7.2* 8.9*  MCV  --    < > 76.5*   < > 81.1 82.1 82.6 84.2 84.6  FERRITIN 625*  --  688*  --   --   --   --   --   --   TIBC 154*  --  174*  --   --   --   --   --   --  IRON 19*  --  42*  --   --   --   --   --   --    < > = values in this interval not displayed.     Hyperglycemia -A1c 5.7 on 12/15.   -Not on diabetes medications at home. Currently on Lantus 5 units daily. Continue sliding scale insulin with Accu-Cheks. Last Labs          Recent Labs  Lab 11/20/20 1947 11/20/20 2316 11/21/20 0329 11/21/20 0755 11/21/20 1122  GLUCAP 167* 160* 192* 258* 217*     Resolved sinus tachycardia Essential hypertension -Home meds include Amlodipine 10 mg daily, lisinopril 10 mg daily. -Currently heart rate is 90 and blood pressure is in normal range.  Patient is on Coreg 3.125 mg twice daily. -Continue the same.   Mobility: PT evaluation recommended SNF. Code Status:  Code Status: Full Code  Nutritional status: Body mass index is 25.22 kg/m. Nutrition Problem: Inadequate oral intake Etiology: dysphagia Signs/Symptoms: NPO status    Diet Order                  Diet NPO time specified  Diet effective now                  DVT prophylaxis: heparin injection 5,000 Units Start: 11/18/20 1400 SCDs Start: 11/15/20 0241   Antimicrobials: Completed the course of Zosyn on 12/26 Fluid: None Consultants: Neurology Family Communication: Son not at bedside today    Remains inpatient appropriate because:Altered mental status    Status is: Inpatient    Dispo:  Patient From: Home  Planned Disposition: Skilled Nursing Facility  Expected discharge date: 11/24/2020  Medically stable for discharge: No, pending bed placement at SNF.         Objective: Vitals:   11/22/20 1200 11/22/20 1300 11/22/20 1400 11/22/20 1449  BP: 125/67   112/90  Pulse:  88 92   Resp: 16 17 (!) 22 (!) 22  Temp:    (!) 97 F (36.1 C)  TempSrc:    Oral  SpO2: 100% 100% 97% 97%  Weight:      Height:        Intake/Output Summary (Last 24 hours) at 11/22/2020 1652 Last data filed at 11/22/2020 1400 Gross per 24 hour  Intake 1420 ml  Output 1250 ml  Net 170 ml   Filed Weights   11/19/20 0444 11/20/20 0513 11/22/20 0500  Weight: 86 kg 86.7 kg 82.3 kg    Exam:  . General: 69 y.o. year-old male well developed well nourished in no acute distress.  Nonverbal and minimally interactive.  Follows command. . Cardiovascular: Regular rate and rhythm with no rubs or gallops.  No thyromegaly or JVD noted.   Marland Kitchen Respiratory: Clear to auscultation with no wheezes or rales. Good inspiratory effort. . Abdomen: Soft nontender nondistended with normal bowel sounds x4 quadrants. . Musculoskeletal: Right below the knee amputation.  No lower  extremity edema.   Marland Kitchen Psychiatry: Altered.   Data Reviewed: CBC: Recent Labs  Lab 11/18/20 0031 11/19/20 1417 11/20/20 0259 11/21/20 0151 11/22/20 0031  WBC 15.4* 15.7* 6.1 16.0* 14.5*  HGB 7.9* 8.3* 7.2* 8.9* 9.4*  HCT 24.8* 25.2* 23.0* 27.4* 28.9*  MCV 82.1 82.6 84.2 84.6 85.5  PLT 242 249 154 266 272   Basic Metabolic Panel: Recent Labs  Lab 11/15/20 1703 11/15/20 1703 11/16/20 0410 11/16/20 1707 11/17/20 0519 11/18/20 0031 11/20/20 0259 11/21/20 0850 11/22/20 0031  NA  --    < >  136  --  138 144 147* 145 148*  K  --    < > 3.7  --  3.7 3.7 3.9 4.1 4.8  CL  --    < > 110  --  115* 114* 116* 115* 118*  CO2  --    < > 16*  --  16* 19* 19* 17* 22  GLUCOSE  --    < > 154*  --  149* 131* 99 257* 169*  BUN  --    < > 22  --  30* 28* 17 27* 35*  CREATININE  --    < > 1.59*  --  1.68* 1.55* 1.63* 1.69* 1.66*  CALCIUM  --    < > 8.0*  --  7.7* 8.1* 8.8* 8.4* 8.5*  MG 2.4  --  2.3 2.3  --   --   --  2.1  --   PHOS 2.4*  --  2.0* 3.3  --  2.2* 3.0 3.1  --    < > = values in this interval not displayed.   GFR: Estimated Creatinine Clearance: 48.8 mL/min (A) (by C-G formula based on SCr of 1.66 mg/dL (H)). Liver Function Tests: Recent Labs  Lab 11/18/20 0031 11/21/20 0850  AST 28 91*  ALT 62* 38  ALKPHOS 95 108  BILITOT 0.7 0.6  PROT 6.0* 6.1*  ALBUMIN 2.1* 2.2*   No results for input(s): LIPASE, AMYLASE in the last 168 hours. No results for input(s): AMMONIA in the last 168 hours. Coagulation Profile: No results for input(s): INR, PROTIME in the last 168 hours. Cardiac Enzymes: No results for input(s): CKTOTAL, CKMB, CKMBINDEX, TROPONINI in the last 168 hours. BNP (last 3 results) No results for input(s): PROBNP in the last 8760 hours. HbA1C: No results for input(s): HGBA1C in the last 72 hours. CBG: Recent Labs  Lab 11/21/20 2328 11/22/20 0355 11/22/20 0732 11/22/20 1114 11/22/20 1612  GLUCAP 148* 235* 244* 245* 171*   Lipid Profile: No results for  input(s): CHOL, HDL, LDLCALC, TRIG, CHOLHDL, LDLDIRECT in the last 72 hours. Thyroid Function Tests: No results for input(s): TSH, T4TOTAL, FREET4, T3FREE, THYROIDAB in the last 72 hours. Anemia Panel: No results for input(s): VITAMINB12, FOLATE, FERRITIN, TIBC, IRON, RETICCTPCT in the last 72 hours. Urine analysis:    Component Value Date/Time   COLORURINE AMBER (A) 11/08/2020 1800   APPEARANCEUR TURBID (A) 11/08/2020 1800   LABSPEC 1.010 11/08/2020 1800   PHURINE 6.0 11/08/2020 1800   GLUCOSEU NEGATIVE 11/08/2020 1800   HGBUR MODERATE (A) 11/08/2020 1800   BILIRUBINUR NEGATIVE 11/08/2020 1800   KETONESUR NEGATIVE 11/08/2020 1800   PROTEINUR 30 (A) 11/08/2020 1800   NITRITE NEGATIVE 11/08/2020 1800   LEUKOCYTESUR LARGE (A) 11/08/2020 1800   Sepsis Labs: @LABRCNTIP (procalcitonin:4,lacticidven:4)  ) Recent Results (from the past 240 hour(s))  SARS Coronavirus 2 by RT PCR (hospital order, performed in [facility] hospital lab) Nasopharyngeal Nasopharyngeal Swab     Status: None   Collection Time: 11/14/20  2:04 PM   Specimen: Nasopharyngeal Swab  Result Value Ref Range Status   SARS Coronavirus 2 NEGATIVE NEGATIVE Final    Comment: (NOTE) SARS-CoV-2 target nucleic acids are NOT DETECTED.  The SARS-CoV-2 RNA is generally detectable in upper and lower respiratory specimens during the acute phase of infection. The lowest concentration of SARS-CoV-2 viral copies this assay can detect is 250 copies / mL. A negative result does not preclude SARS-CoV-2 infection and should not be used as the sole basis for treatment  or other patient management decisions.  A negative result may occur with improper specimen collection / handling, submission of specimen other than nasopharyngeal swab, presence of viral mutation(s) within the areas targeted by this assay, and inadequate number of viral copies (<250 copies / mL). A negative result must be combined with clinical observations, patient  history, and epidemiological information.  Fact Sheet for Patients:   BoilerBrush.com.cyhttps://www.fda.gov/media/136312/download  Fact Sheet for Healthcare Providers: https://pope.com/https://www.fda.gov/media/136313/download  This test is not yet approved or  cleared by the [place] FDA and has been authorized for detection and/or diagnosis of SARS-CoV-2 by FDA under an Emergency Use Authorization (EUA).  This EUA will remain in effect (meaning this test can be used) for the duration of the COVID-19 declaration under Section 564(b)(1) of the Act, 21 U.S.C. section 360bbb-3(b)(1), unless the authorization is terminated or revoked sooner.  Performed at [facility], [address] [place], [place] [address]   Culture, respiratory (non-expectorated)     Status: None   Collection Time: 11/15/20 11:58 AM   Specimen: Tracheal Aspirate; Respiratory  Result Value Ref Range Status   Specimen Description TRACHEAL ASPIRATE  Final   Special Requests NONE  Final   Gram Stain   Final    ABUNDANT WBC PRESENT, PREDOMINANTLY PMN FEW GRAM NEGATIVE RODS RARE GRAM POSITIVE COCCI    Culture   Final    FEW Normal respiratory flora-no Staph aureus or Pseudomonas seen Performed at [facility], [address] [place], [place] [address]    Report Status 11/17/2020 FINAL  Final      Studies: No results found.  Scheduled Meds: . sodium chloride   Intravenous Once  . sodium chloride   Intravenous Once  . aspirin  81 mg Per NG tube Daily  . atorvastatin  40 mg Per NG tube Daily  . benztropine  1 mg Per NG tube QHS  . carvedilol  3.125 mg Per NG tube BID WC  . chlorhexidine  15 mL Mouth Rinse BID  . Chlorhexidine Gluconate Cloth  6 each Topical Daily  . feeding supplement (PROSource TF)  45 mL Per Tube TID  . heparin injection (subcutaneous)  5,000 Units Subcutaneous Q8H  . insulin aspart  0-9 Units Subcutaneous Q4H  . insulin glargine  5 Units Subcutaneous Daily  . mouth rinse  15 mL Mouth Rinse q12n4p   . mirtazapine  15 mg Per NG tube QHS  . sodium chloride flush  10-40 mL Intracatheter Q12H  . white petrolatum        Continuous Infusions: . sodium chloride Stopped (11/15/20 0505)  . sodium chloride 10 mL/hr at 11/18/20 1900  . feeding supplement (OSMOLITE 1.5 CAL) 1,000 mL (11/22/20 1353)  . levETIRAcetam Stopped (11/22/20 0440)     LOS: 13 days     Darlin Droparole N Anurag Scarfo, MD [facility] Pager [phone]  If 7PM-7AM, please contact night-coverage [email or web address] Password [email or web address] 11/22/2020, 4:52 PM

## 2020-11-22 NOTE — Progress Notes (Signed)
Inpatient Diabetes Program Recommendations  AACE/ADA: New Consensus Statement on Inpatient Glycemic Control (2015)  Target Ranges:  Prepandial:   less than 140 mg/dL      Peak postprandial:   less than 180 mg/dL (1-2 hours)      Critically ill patients:  140 - 180 mg/dL   Lab Results  Component Value Date   GLUCAP 245 (H) 11/22/2020   HGBA1C 5.7 (H) 11/08/2020    Review of Glycemic Control Results for MARCELLE, BEBOUT (MRN [id number]) as of 11/22/2020 12:14  Ref. Range 11/22/2020 03:55 11/22/2020 07:32 11/22/2020 11:14  Glucose-Capillary Latest Ref Range: 70 - 99 mg/dL 614 (H) 431 (H) 540 (H)   Diabetes history: PreDM? Outpatient Diabetes medications: none Current orders for Inpatient glycemic control: Novolog 0-9 units Q4H Osmolite 1.5 @ 60 ml/hr  Inpatient Diabetes Program Recommendations:     Consider adding Novolog 2 units Q4H for tube feed coverage (to be stopped or held in the event tube feeds are stopped).   Thanks, Lujean Rave, MSN, RNC-OB Diabetes Coordinator [phone] (8a-5p)

## 2020-11-22 NOTE — Plan of Care (Signed)

## 2020-11-22 NOTE — Progress Notes (Signed)
  Speech Language Pathology Treatment: Dysphagia  Patient Details Name: Ricky Hanson MRN: [id number] DOB: 01-20-1951 Today's Date: 11/22/2020 Time: 2585-2778 SLP Time Calculation (min) (ACUTE ONLY): 10 min  Assessment / Plan / Recommendation Clinical Impression  Pt was seen for dysphagia treatment after Romeo Apple, RN reported that the pt demonstrated significant coughing with thin liquids this afternoon. Pt tolerated consecutive swallows and large boluses of thin liquids via cup and straw without overt s/sx of aspiration. Pt exhibited impulsive tendencies and the impact of this on his earlier tolerance is considered. Pt was educated regarding the need for smaller boluses and reduced intake rate to reduce aspiration risk. He verbalized understanding and agreement with recommendations, but stated that he likes to eat/drink quickly. Intermittent supervision will likely be needed. It is recommended that the pt's current diet be continued. SLP will continue to follow pt.    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 He was admitted on 12/15 secondary to confusion and found to have obstructive uropathy and acute renal insufficiency. He developed ventricular fibrillation and cardiac arrest on 12/22. EEG 12/22: severe diffuse encephalopathy; no seizures. 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 No acute insult. Swallow screening was completed, but pt exhibited coughing with liquids and puree.      SLP Plan  Continue with current plan of care       Recommendations  Diet recommendations: Dysphagia 2 (fine chop);Thin liquid Liquids provided via: Cup;Straw Medication Administration: Crushed with puree (or via Cortrak) Supervision: Staff to  assist with self feeding Compensations: Slow rate;Small sips/bites Postural Changes and/or Swallow Maneuvers: Seated upright 90 degrees;Upright 30-60 min after meal                Oral Care Recommendations: Oral care BID Follow up Recommendations: Inpatient Rehab SLP Visit Diagnosis: Dysphagia, unspecified (R13.10) Plan: Continue with current plan of care       Marlow Berenguer I. Vear Clock, MS, CCC-SLP Acute Rehabilitation Services Office number [phone] Pager [phone]                 Scheryl Marten 11/22/2020, 5:18 PM

## 2020-11-22 NOTE — Progress Notes (Signed)
Orthopedic Tech Progress Note Patient Details:  Ardith Lewman Nov 04, 1951 [id number] Fitter from [facility] called requesting where patient was at so she could drop off patient PROSTHETIC LEG Patient ID: Ovid Witman, male   DOB: [birth year], 69 y.o.   MRN: [id number]   [name] 11/22/2020, 10:24 AM

## 2020-11-22 NOTE — Progress Notes (Addendum)
Cortrak length shows only 35 cm in place. The Cortrak was bridled but it looks like a patient has been pulling. Mittens are applied. TF was stopped. Patient is 95% on Room air and no respiratory distress noted. Dr. Antionette Char notified and KUB for placement check ordered.   After Xray, Dr. Antionette Char ordered NPO due to small bowel obstruction. Patient doesn't show any nausea or vomiting. Also, stool looks like soft. No output today. Rectal tube was removed. Will continue to assess.

## 2020-11-22 NOTE — Progress Notes (Signed)
Notified son, Loura Halt, of patient's transfer to room 2W26. Son stated he would call main number later to obtain phone number for new unit.

## 2020-11-22 NOTE — Progress Notes (Signed)
  Speech Language Pathology Treatment: Dysphagia  Patient Details Name: Ricky Hanson MRN: [id number] DOB: Mar 29, 1951 Today's Date: 11/22/2020 Time: 2426-8341 SLP Time Calculation (min) (ACUTE ONLY): 12 min  Assessment / Plan / Recommendation Clinical Impression  Pt was seen for dysphagia treatment. He was alert and cooperative during the session. Vocal quality was notably improved compared to the initial evaluation but still hypophonic and pt indicated that it was not at baseline. No s/sx of aspiration were noted with any solids or a liquids. Mastication was prolonged with dysphagia 3 solids secondary to limited dentition but functional for dysphagia 2 solids. Mild lingual residue was cleared with secondary swallows/liquids and no oral holding was observed during today's session. A dysphagia 2 diet with thin liquids is recommended at this time. SLP will continue to follow pt.    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 He was admitted on 12/15 secondary to confusion and found to have obstructive uropathy and acute renal insufficiency. He developed ventricular fibrillation and cardiac arrest on 12/22. EEG 12/22: severe diffuse encephalopathy; no seizures. 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 No acute insult. Swallow screening was completed, but pt exhibited coughing with liquids and puree.      SLP Plan  Continue with current plan of care       Recommendations  Diet recommendations: Dysphagia 2 (fine chop);Thin liquid Liquids provided via: Cup;Straw Medication Administration: Crushed with puree (or via Cortrak) Supervision: Staff to assist with self feeding Compensations: Slow rate;Small sips/bites Postural Changes  and/or Swallow Maneuvers: Seated upright 90 degrees;Upright 30-60 min after meal                Oral Care Recommendations: Oral care BID Follow up Recommendations: Inpatient Rehab SLP Visit Diagnosis: Dysphagia, unspecified (R13.10) Plan: Continue with current plan of care       Mada Sadik I. Vear Clock, MS, CCC-SLP Acute Rehabilitation Services Office number [phone] Pager [phone]                Scheryl Marten 11/22/2020, 10:46 AM

## 2020-11-23 ENCOUNTER — Inpatient Hospital Stay (HOSPITAL_COMMUNITY): Payer: Medicare Other

## 2020-11-23 ENCOUNTER — Encounter (HOSPITAL_COMMUNITY): Payer: Self-pay | Admitting: Internal Medicine

## 2020-11-23 DIAGNOSIS — N179 Acute kidney failure, unspecified: Secondary | ICD-10-CM | POA: Diagnosis not present

## 2020-11-23 LAB — GLUCOSE, CAPILLARY
Glucose-Capillary: 147 mg/dL — ABNORMAL HIGH (ref 70–99)
Glucose-Capillary: 154 mg/dL — ABNORMAL HIGH (ref 70–99)
Glucose-Capillary: 143 mg/dL — ABNORMAL HIGH (ref 70–99)
Glucose-Capillary: 112 mg/dL — ABNORMAL HIGH (ref 70–99)
Glucose-Capillary: 131 mg/dL — ABNORMAL HIGH (ref 70–99)
Glucose-Capillary: 136 mg/dL — ABNORMAL HIGH (ref 70–99)

## 2020-11-23 LAB — BASIC METABOLIC PANEL
Anion gap: 11 (ref 5–15)
BUN: 30 mg/dL — ABNORMAL HIGH (ref 8–23)
CO2: 22 mmol/L (ref 22–32)
Calcium: 9.1 mg/dL (ref 8.9–10.3)
Chloride: 115 mmol/L — ABNORMAL HIGH (ref 98–111)
Creatinine, Ser: 1.39 mg/dL — ABNORMAL HIGH (ref 0.61–1.24)
GFR, Estimated: 56 mL/min — ABNORMAL LOW (ref >60)
Glucose, Bld: 157 mg/dL — ABNORMAL HIGH (ref 70–99)
Potassium: 4.1 mmol/L (ref 3.5–5.1)
Sodium: 148 mmol/L — ABNORMAL HIGH (ref 135–145)

## 2020-11-23 LAB — CBC
HCT: 34.1 % — ABNORMAL LOW (ref 39.0–52.0)
Hemoglobin: 10.6 g/dL — ABNORMAL LOW (ref 13.0–17.0)
MCH: 26.3 pg (ref 26.0–34.0)
MCHC: 31.1 g/dL (ref 30.0–36.0)
MCV: 84.6 fL (ref 80.0–100.0)
Platelets: UNDETERMINED 10*3/uL (ref 150–400)
RBC: 4.03 MIL/uL — ABNORMAL LOW (ref 4.22–5.81)
RDW: 17.2 % — ABNORMAL HIGH (ref 11.5–15.5)
WBC: 18.9 10*3/uL — ABNORMAL HIGH (ref 4.0–10.5)
nRBC: 0.3 % — ABNORMAL HIGH (ref 0.0–0.2)

## 2020-11-23 LAB — MAGNESIUM: Magnesium: 2.3 mg/dL (ref 1.7–2.4)

## 2020-11-23 LAB — PROCALCITONIN: Procalcitonin: 0.11 ng/mL

## 2020-11-23 LAB — LACTIC ACID, PLASMA: Lactic Acid, Venous: 1 mmol/L (ref 0.5–1.9)

## 2020-11-23 MED ORDER — KCL-LACTATED RINGERS-D5W 20 MEQ/L IV SOLN
INTRAVENOUS | Status: DC
  Filled 2020-11-23 (×3): qty 1000

## 2020-11-23 MED ORDER — ASPIRIN 300 MG RE SUPP: 300.0000 mg | Freq: Every day | RECTAL | Status: DC

## 2020-11-23 MED ORDER — SODIUM CHLORIDE 0.9% FLUSH
10.0000 mL | Freq: Two times a day (BID) | INTRAVENOUS | Status: DC
  Administered 2020-11-23 – 2020-12-03 (×17): 10 mL

## 2020-11-23 MED ORDER — SODIUM CHLORIDE 0.9% FLUSH
10.0000 mL | INTRAVENOUS | Status: DC | PRN
Start: 2020-11-23 — End: 2020-12-04

## 2020-11-23 MED ORDER — ASPIRIN 300 MG RE SUPP
150.0000 mg | Freq: Every day | RECTAL | Status: DC
  Administered 2020-11-23: 150 mg via RECTAL
  Filled 2020-11-23 (×2): qty 1

## 2020-11-23 MED ORDER — PROSOURCE TF PO LIQD: 45.0000 mL | Freq: Three times a day (TID) | ORAL | Status: DC
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
## 2020-11-23 NOTE — Progress Notes (Signed)
Ricky Hanson vomited projectile.... Cortrak removed and NG tube 1fr inserted in left nare and 1000 ml of greenish emesis came out. LWIS setting applied. vital signs are stable. he is on room air. his breathing sounds have been rhonchi. Dr. Antionette Char notified. Will continue to assess.

## 2020-11-23 NOTE — Progress Notes (Signed)
PROGRESS NOTE  Ricky Hanson QPY:[id number] DOB: 1951/02/20 DOA: 11/08/2020 PCP: Hoy Register, MD  HPI/Recap of past 24 hours: Ricky Hanson is a 69 y.o. male with PMH significant for DM2, HTN, HLD, schizophrenia. Patient was initially admitted to the hospital on 12/15 for AKI, obstructive uropathy.  On 12/22, patient developed V. fib and cardiac arrest, was pulseless for about 20 minutes, successfully resuscitated, intubated transferred to ICU. 12/24, MRI brain showed a small subacute to chronic white matter infarct in the subcortical right frontal lobe with no acute insult. Extubated when?? 12/27, transferred out of ICU to hospitalist service.  11/23/20: Vomiting overnight, abdominal x-ray showed SBO.  Patient was seen by general surgery, he had his left inguinal hernia reduced.  A CT abdomen and pelvis was obtained showing worsening distal bowel obstruction.  NG tube in place to suction.  Assessment/Plan: Principal Problem:   ARF (acute renal failure) (HCC) Active Problems:   Chronic indwelling Foley catheter   Peripheral vascular disease (HCC)   CKD (chronic kidney disease), stage III (HCC)   Essential hypertension   AKI (acute kidney injury) (HCC)   Cardiac arrest (HCC)  Acute hypoxic respiratory failure due to possible aspiration pneumonia Status post VT/VF cardiac arrest - ~44mins downtime Currently on room air with O2 saturation of 95% -TTE showed normal ejection fraction. -Completed 5-day course of IV Zosyn on 12/26. Continuous pulse oximetry  SBO on chest x-ray and CT scan General surgery consulted and following. N.p.o. Repeat portable abdominal x-ray in the morning Started IV fluid D5 LR KCl at 75 cc/h Optimize potassium and magnesium levels Continue IV fluids Mobilize as able Daily BMPs  Left inguinal hernia, reduced by general surgery on 11/23/2020 Will likely need to follow-up with general surgery outpatient.  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 -Prior to admission, patient was on benztropine 1 mg nightly and long-acting Haldol subcutaneous monthly. -Continue to monitor mental status. -Neurology recommended continuation of Keppra 500 mg twice daily. If patient has clinical seizure, recommendation is to increase it to 1000 mg daily. Patient is to follow-up with neurology as an outpatient in 8 to 10 weeks of discharge. If remains seizure-free, can consider repeat EEG and weaning off Keppra outpatient. 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 -MRA head and bilateral carotid duplex unremarkable. -Discussed with neurology. -TTE showed normal ejection fraction without cardiac source of embolism. -Currently on aspirin and statin for secondary stroke prophylaxis. -TTE showed normal ejection fraction. Switch aspirin to rectal since now NPO.  Resolving acute kidney injury on CKD stage IIIa  Baseline creatinine appears to be 1.3 with GFR of 56. Creatinine downtrending, 1.39 from 1.69  Continue to avoid nephrotoxins  Anemia of critical illness Patient received 1 unit PRBCs so far during this admission No signs of bleeding H&H is stable, transfuse if less than 7 Hemoglobin is uptrending, 10.6 from 9.4.  Hyperglycemia -A1c 5.7 on 11/08/20.   -Not on diabetes medications at home. Currently on Lantus 5 units daily. Continue sliding scale insulin with Accu-Cheks.  Resolved sinus tachycardia Essential hypertension -Home meds include Amlodipine 10 mg daily, lisinopril 10 mg daily. -Currently heart rate is 90 and blood pressure is in normal range. Patient is on Coreg 3.125 mg  twice  daily. -Continue the same.  BP and heart rate are currently at goal.  Mobility: PT evaluation recommended SNF. Code Status:  Code Status: Full Code  Nutritional status: Body mass index is 25.22 kg/m. Nutrition Problem: Inadequate oral intake Etiology: dysphagia Signs/Symptoms: NPO status    Diet Order                  Diet NPO time specified  Diet effective now                  DVT prophylaxis: heparin injection 5,000 Units Start: 11/18/20 1400 SCDs Start: 11/15/20 0241   Antimicrobials: Completed the course of Zosyn on 12/26 Fluid: None Consultants: Neurology Family Communication:  Updated his son via phone on 11/23/2020.    Remains inpatient appropriate because:Altered mental status    Status is: Inpatient    Dispo:  Patient From: Home  Planned Disposition: Skilled Nursing Facility  Expected discharge date: 11/27/2020  Medically stable for discharge: No, management of SBO.        Objective: Vitals:   11/23/20 0300 11/23/20 0427 11/23/20 0600 11/23/20 1532  BP: 130/85  (!) 145/86 126/81  Pulse: 98  96 95  Resp: 20  19 15   Temp: (!) 97.5 F (36.4 C)   98.1 F (36.7 C)  TempSrc: Oral     SpO2: 96%  93% 95%  Weight:  82.1 kg    Height:        Intake/Output Summary (Last 24 hours) at 11/23/2020 1755 Last data filed at 11/23/2020 1100 Gross per 24 hour  Intake 460 ml  Output 2675 ml  Net -2215 ml   Filed Weights   11/20/20 0513 11/22/20 0500 11/23/20 0427  Weight: 86.7 kg 82.3 kg 82.1 kg    Exam:  . General: 69 y.o. year-old male well-developed well-nourished somnolent but arousable to voices.  NG tube in place with suction. . Cardiovascular: Regular rate and rhythm no rubs or gallops. 79 Respiratory: Mild rales at bases.  No wheezing noted.  Poor inspiratory effort. . Abdomen: Distended hypoactive bowel sounds.   . Musculoskeletal: Right below the knee amputation.  Trace edema in left lower extremity noted.    Marland Kitchen Psychiatry: Unable to assess mood due to somnolence.   Data Reviewed: CBC: Recent Labs  Lab 11/19/20 1417 11/20/20 0259 11/21/20 0151 11/22/20 0031 11/23/20 0230  WBC 15.7* 6.1 16.0* 14.5* 18.9*  HGB 8.3* 7.2* 8.9* 9.4* 10.6*  HCT 25.2* 23.0* 27.4* 28.9* 34.1*  MCV 82.6 84.2 84.6 85.5 84.6  PLT 249 154 266 272 PLATELET CLUMPS NOTED ON SMEAR, UNABLE TO ESTIMATE   Basic Metabolic Panel: Recent Labs  Lab 11/18/20 0031 11/20/20 0259 11/21/20 0850 11/22/20 0031 11/23/20 0647  NA 144 147* 145 148* 148*  K 3.7 3.9 4.1 4.8 4.1  CL 114* 116* 115* 118* 115*  CO2 19* 19* 17* 22 22  GLUCOSE 131* 99 257* 169* 157*  BUN 28* 17 27* 35* 30*  CREATININE 1.55* 1.63* 1.69* 1.66* 1.39*  CALCIUM 8.1* 8.8* 8.4* 8.5* 9.1  MG  --   --  2.1  --  2.3  PHOS 2.2* 3.0 3.1  --   --    GFR: Estimated Creatinine Clearance: 58.3 mL/min (A) (by C-G formula based on SCr of 1.39 mg/dL (H)). Liver Function Tests: Recent Labs  Lab 11/18/20 0031 11/21/20 0850  AST 28 91*  ALT 62* 38  ALKPHOS 95 108  BILITOT 0.7 0.6  PROT 6.0* 6.1*  ALBUMIN 2.1* 2.2*  No results for input(s): LIPASE, AMYLASE in the last 168 hours. No results for input(s): AMMONIA in the last 168 hours. Coagulation Profile: No results for input(s): INR, PROTIME in the last 168 hours. Cardiac Enzymes: No results for input(s): CKTOTAL, CKMB, CKMBINDEX, TROPONINI in the last 168 hours. BNP (last 3 results) No results for input(s): PROBNP in the last 8760 hours. HbA1C: No results for input(s): HGBA1C in the last 72 hours. CBG: Recent Labs  Lab 11/22/20 2045 11/23/20 0015 11/23/20 0412 11/23/20 1251 11/23/20 1555  GLUCAP 230* 154* 147* 143* 112*   Lipid Profile: No results for input(s): CHOL, HDL, LDLCALC, TRIG, CHOLHDL, LDLDIRECT in the last 72 hours. Thyroid Function Tests: No results for input(s): TSH, T4TOTAL, FREET4, T3FREE, THYROIDAB in the last 72 hours. Anemia Panel: No results for input(s): VITAMINB12,  FOLATE, FERRITIN, TIBC, IRON, RETICCTPCT in the last 72 hours. Urine analysis:    Component Value Date/Time   COLORURINE AMBER (A) 11/08/2020 1800   APPEARANCEUR TURBID (A) 11/08/2020 1800   LABSPEC 1.010 11/08/2020 1800   PHURINE 6.0 11/08/2020 1800   GLUCOSEU NEGATIVE 11/08/2020 1800   HGBUR MODERATE (A) 11/08/2020 1800   BILIRUBINUR NEGATIVE 11/08/2020 1800   KETONESUR NEGATIVE 11/08/2020 1800   PROTEINUR 30 (A) 11/08/2020 1800   NITRITE NEGATIVE 11/08/2020 1800   LEUKOCYTESUR LARGE (A) 11/08/2020 1800   Sepsis Labs: @LABRCNTIP (procalcitonin:4,lacticidven:4)  ) Recent Results (from the past 240 hour(s))  SARS Coronavirus 2 by RT PCR (hospital order, performed in [facility] hospital lab) Nasopharyngeal Nasopharyngeal Swab     Status: None   Collection Time: 11/14/20  2:04 PM   Specimen: Nasopharyngeal Swab  Result Value Ref Range Status   SARS Coronavirus 2 NEGATIVE NEGATIVE Final    Comment: (NOTE) SARS-CoV-2 target nucleic acids are NOT DETECTED.  The SARS-CoV-2 RNA is generally detectable in upper and lower respiratory specimens during the acute phase of infection. The lowest concentration of SARS-CoV-2 viral copies this assay can detect is 250 copies / mL. A negative result does not preclude SARS-CoV-2 infection and should not be used as the sole basis for treatment or other patient management decisions.  A negative result may occur with improper specimen collection / handling, submission of specimen other than nasopharyngeal swab, presence of viral mutation(s) within the areas targeted by this assay, and inadequate number of viral copies (<250 copies / mL). A negative result must be combined with clinical observations, patient history, and epidemiological information.  Fact Sheet for Patients:   11/16/20  Fact Sheet for Healthcare Providers: BoilerBrush.com.cy  This test is not yet approved or  cleared by  the https://pope.com/ FDA and has been authorized for detection and/or diagnosis of SARS-CoV-2 by FDA under an Emergency Use Authorization (EUA).  This EUA will remain in effect (meaning this test can be used) for the duration of the COVID-19 declaration under Section 564(b)(1) of the Act, 21 U.S.C. section 360bbb-3(b)(1), unless the authorization is terminated or revoked sooner.  Performed at [facility], [address] [place], [place]   Culture, respiratory (non-expectorated)     Status: None   Collection Time: 11/15/20 11:58 AM   Specimen: Tracheal Aspirate; Respiratory  Result Value Ref Range Status   Specimen Description TRACHEAL ASPIRATE  Final   Special Requests NONE  Final   Gram Stain   Final    ABUNDANT WBC PRESENT, PREDOMINANTLY PMN FEW GRAM NEGATIVE RODS RARE GRAM POSITIVE COCCI    Culture   Final    FEW Normal  respiratory flora-no Staph aureus or Pseudomonas seen Performed at [facility], [address], [place], [place] [address]    Report Status 11/17/2020 FINAL  Final      Studies: CT ABDOMEN PELVIS WO CONTRAST  Result Date: 11/23/2020 CLINICAL DATA:  Abdominal distension. EXAM: CT ABDOMEN AND PELVIS WITHOUT CONTRAST TECHNIQUE: Multidetector CT imaging of the abdomen and pelvis was performed following the standard protocol without IV contrast. COMPARISON:  November 08, 2020. FINDINGS: Lower chest: Mild right basilar hydropneumothorax is noted which is a new finding. Right lower lobe atelectasis or infiltrate is noted. Small left pleural effusion is noted with adjacent subsegmental atelectasis. Hepatobiliary: Cholelithiasis. No biliary dilatation is noted. The liver is unremarkable. Pancreas: Unremarkable. No pancreatic ductal dilatation or surrounding inflammatory changes. Spleen: Normal in size without focal abnormality. Adrenals/Urinary Tract: Adrenal glands and kidneys appear normal. No hydronephrosis or renal obstruction is noted. Urinary  bladder is decompressed secondary to Foley catheter. Stomach/Bowel: Nasogastric tube is seen looped within the proximal stomach. Dilated small bowel loops are noted concerning for distal small bowel obstruction. No colonic dilatation is noted. Stool is noted throughout the colon and rectum. Vascular/Lymphatic: Aortic atherosclerosis. No enlarged abdominal or pelvic lymph nodes. Reproductive: Prostate is unremarkable. Other: Moderate size left inguinal hernia is noted which contains fat and fluid. Mild anasarca is noted. No ascites is noted. Mild fat containing supraumbilical ventral hernia is noted. Musculoskeletal: No acute or significant osseous findings. IMPRESSION: 1. Mild right basilar hydropneumothorax is noted which is a new finding. These results will be called to the ordering clinician or representative by the Radiologist Assistant, and communication documented in the PACS or zVision Dashboard. 2. Right lower lobe atelectasis or infiltrate is noted. Small left pleural effusion is noted with adjacent subsegmental atelectasis. 3. Dilated small bowel loops are noted concerning for worsening distal small bowel obstruction. 4. Cholelithiasis. 5. Moderate size left inguinal hernia is noted which contains fat and fluid. 6. Mild fat containing supraumbilical ventral hernia. 7. Mild anasarca. 8. Aortic atherosclerosis. Aortic Atherosclerosis (ICD10-I70.0). Electronically Signed   By: Lupita Raider M.D.   On: 11/23/2020 11:04   DG Abd Portable 1V  Result Date: 11/22/2020 CLINICAL DATA:  Migration of nasogastric tube EXAM: PORTABLE ABDOMEN - 1 VIEW COMPARISON:  Chest 11/17/2020 FINDINGS: Tip of the enteric tube is visualized at the upper limits of the image, projected over the mid/distal esophagus. Gas-filled dilated small bowel suggesting small bowel obstruction. Stool-filled nondilated colon. Atelectasis or infiltration in the lung bases. Radiopaque metallic foreign bodies projected over the left pelvis.  IMPRESSION: Tip of the enteric tube is at the upper limits of the image, projected over the mid/distal esophagus. Gas-filled dilated small bowel suggesting small bowel obstruction. Electronically Signed   By: Burman Nieves M.D.   On: 11/22/2020 20:58    Scheduled Meds: . sodium chloride   Intravenous Once  . sodium chloride   Intravenous Once  . aspirin  81 mg Per NG tube Daily  . atorvastatin  40 mg Per NG tube Daily  . benztropine  1 mg Per NG tube QHS  . carvedilol  3.125 mg Per NG tube BID WC  . chlorhexidine  15 mL Mouth Rinse BID  . Chlorhexidine Gluconate Cloth  6 each Topical Daily  . heparin injection (subcutaneous)  5,000 Units Subcutaneous Q8H  . insulin aspart  0-9 Units Subcutaneous Q4H  . mouth rinse  15 mL Mouth Rinse q12n4p  . mirtazapine  15 mg Per NG tube QHS  .  sodium chloride flush  10-40 mL Intracatheter Q12H    Continuous Infusions: . sodium chloride Stopped (11/15/20 0505)  . sodium chloride 10 mL/hr at 11/18/20 1900  . dextrose 5% lactated ringers with KCl 20 mEq/L 75 mL/hr at 11/23/20 0957  . levETIRAcetam 500 mg (11/23/20 0424)     LOS: 14 days     Darlin Droparole N Vencil Basnett, MD [facility] Pager [phone]  If 7PM-7AM, please contact night-coverage [email or web address] Password [email or web address] 11/23/2020, 5:55 PM

## 2020-11-23 NOTE — Progress Notes (Signed)
Patient with NG tube in place. NG pulled out by patient while sleeping and reinserted shortly after. Patient tolerated well. Placement confirmed with xray. Patient with small bowel movement during the procedure. Proceedings charted and will continue to monitor.   -Estella Husk, RN

## 2020-11-23 NOTE — Plan of Care (Signed)
  Problem: Clinical Measurements: Goal: Complications related to the disease process or treatment will be avoided or minimized Outcome: Progressing   Problem: Fluid Volume: Goal: Fluid volume balance will be maintained or improved Outcome: Progressing   Problem: Respiratory: Goal: Respiratory symptoms related to disease process will be avoided Outcome: Progressing   Problem: Urinary Elimination: Goal: Progression of disease will be identified and treated Outcome: Progressing   Problem: Cardiac: Goal: Ability to achieve and maintain adequate cardiopulmonary perfusion will improve Outcome: Progressing   Problem: Clinical Measurements: Goal: Will remain free from infection Outcome: Progressing Goal: Diagnostic test results will improve Outcome: Progressing Goal: Respiratory complications will improve Outcome: Progressing Goal: Cardiovascular complication will be avoided Outcome: Progressing   Problem: Coping: Goal: Level of anxiety will decrease Outcome: Progressing   Problem: Elimination: Goal: Will not experience complications related to urinary retention Outcome: Progressing   Problem: Pain Managment: Goal: General experience of comfort will improve Outcome: Progressing   Problem: Safety: Goal: Ability to remain free from injury will improve Outcome: Progressing   Problem: Skin Integrity: Goal: Risk for impaired skin integrity will decrease Outcome: Progressing   Problem: Education: Goal: Knowledge of disease and its progression will improve Outcome: Not Progressing   Problem: Health Behavior/Discharge Planning: Goal: Ability to manage health-related needs will improve Outcome: Not Progressing   Problem: Activity: Goal: Activity intolerance will improve Outcome: Not Progressing   Problem: Nutritional: Goal: Ability to make appropriate dietary choices will improve Outcome: Not Progressing   Problem: Education: Goal: Ability to demonstrate management  of disease process will improve Outcome: Not Progressing Goal: Ability to verbalize understanding of medication therapies will improve Outcome: Not Progressing   Problem: Activity: Goal: Capacity to carry out activities will improve Outcome: Not Progressing   Problem: Education: Goal: Knowledge of General Education information will improve Description: Including pain rating scale, medication(s)/side effects and non-pharmacologic comfort measures Outcome: Not Progressing   Problem: Health Behavior/Discharge Planning: Goal: Ability to manage health-related needs will improve Outcome: Not Progressing   Problem: Clinical Measurements: Goal: Ability to maintain clinical measurements within normal limits will improve Outcome: Not Progressing   Problem: Activity: Goal: Risk for activity intolerance will decrease Outcome: Not Progressing   Problem: Nutrition: Goal: Adequate nutrition will be maintained Outcome: Not Progressing   Problem: Elimination: Goal: Will not experience complications related to bowel motility Outcome: Not Progressing   Problem: Safety: Goal: Non-violent Restraint(s) Outcome: Completed/Met

## 2020-11-24 ENCOUNTER — Inpatient Hospital Stay (HOSPITAL_COMMUNITY): Payer: Medicare Other

## 2020-11-24 DIAGNOSIS — N179 Acute kidney failure, unspecified: Secondary | ICD-10-CM | POA: Diagnosis not present

## 2020-11-24 DIAGNOSIS — I469 Cardiac arrest, cause unspecified: Secondary | ICD-10-CM | POA: Diagnosis not present

## 2020-11-24 DIAGNOSIS — N390 Urinary tract infection, site not specified: Secondary | ICD-10-CM | POA: Diagnosis not present

## 2020-11-24 DIAGNOSIS — J9601 Acute respiratory failure with hypoxia: Secondary | ICD-10-CM | POA: Diagnosis not present

## 2020-11-24 LAB — BASIC METABOLIC PANEL
Anion gap: 9 (ref 5–15)
BUN: 25 mg/dL — ABNORMAL HIGH (ref 8–23)
CO2: 29 mmol/L (ref 22–32)
Calcium: 8.5 mg/dL — ABNORMAL LOW (ref 8.9–10.3)
Chloride: 115 mmol/L — ABNORMAL HIGH (ref 98–111)
Creatinine, Ser: 1.42 mg/dL — ABNORMAL HIGH (ref 0.61–1.24)
GFR, Estimated: 54 mL/min — ABNORMAL LOW (ref >60)
Glucose, Bld: 160 mg/dL — ABNORMAL HIGH (ref 70–99)
Potassium: 4.4 mmol/L (ref 3.5–5.1)
Sodium: 153 mmol/L — ABNORMAL HIGH (ref 135–145)

## 2020-11-24 LAB — GLUCOSE, CAPILLARY
Glucose-Capillary: 124 mg/dL — ABNORMAL HIGH (ref 70–99)
Glucose-Capillary: 93 mg/dL (ref 70–99)
Glucose-Capillary: 142 mg/dL — ABNORMAL HIGH (ref 70–99)
Glucose-Capillary: 177 mg/dL — ABNORMAL HIGH (ref 70–99)
Glucose-Capillary: 154 mg/dL — ABNORMAL HIGH (ref 70–99)

## 2020-11-24 LAB — HEMOGLOBIN AND HEMATOCRIT, BLOOD
HCT: 26.2 % — ABNORMAL LOW (ref 39.0–52.0)
HCT: 26.8 % — ABNORMAL LOW (ref 39.0–52.0)
HCT: 23.9 % — ABNORMAL LOW (ref 39.0–52.0)
Hemoglobin: 8.1 g/dL — ABNORMAL LOW (ref 13.0–17.0)
Hemoglobin: 8.6 g/dL — ABNORMAL LOW (ref 13.0–17.0)
Hemoglobin: 7.7 g/dL — ABNORMAL LOW (ref 13.0–17.0)

## 2020-11-24 LAB — OCCULT BLOOD GASTRIC / DUODENUM (SPECIMEN CUP): Occult Blood, Gastric: POSITIVE — AB

## 2020-11-24 LAB — CBC
HCT: 26.1 % — ABNORMAL LOW (ref 39.0–52.0)
Hemoglobin: 8 g/dL — ABNORMAL LOW (ref 13.0–17.0)
MCH: 26.3 pg (ref 26.0–34.0)
MCHC: 30.7 g/dL (ref 30.0–36.0)
MCV: 85.9 fL (ref 80.0–100.0)
Platelets: 325 10*3/uL (ref 150–400)
RBC: 3.04 MIL/uL — ABNORMAL LOW (ref 4.22–5.81)
RDW: 17.3 % — ABNORMAL HIGH (ref 11.5–15.5)
WBC: 10.7 10*3/uL — ABNORMAL HIGH (ref 4.0–10.5)
nRBC: 0.2 % (ref 0.0–0.2)

## 2020-11-24 LAB — BRAIN NATRIURETIC PEPTIDE: B Natriuretic Peptide: 1314.3 pg/mL — ABNORMAL HIGH (ref 0.0–100.0)

## 2020-11-24 MED ORDER — ONDANSETRON HCL 4 MG/2ML IJ SOLN: 4.0000 mg | Freq: Four times a day (QID) | INTRAMUSCULAR | Status: DC | PRN

## 2020-11-24 MED ORDER — PANTOPRAZOLE SODIUM 40 MG IV SOLR
40.0000 mg | Freq: Two times a day (BID) | INTRAVENOUS | Status: DC
  Administered 2020-11-24 – 2020-11-28 (×9): 40 mg via INTRAVENOUS
  Filled 2020-11-24 (×9): qty 40

## 2020-11-24 NOTE — Progress Notes (Signed)
Gastroccult collected and sent to lab for resulting. No developer located on unit for unit results. Awaiting.
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
## 2020-11-24 NOTE — Progress Notes (Signed)
Cross-coverage note:   Patient with suspected SBO pulled out NGT and multiple attempts at reinsertion were unsuccessful. RN raised concern that NG output looked bloody and Hgb is down 2.6 g in 24 hours though BUN has decreased to 25 this am.   Plan to check gastric fluids for occult blood, hold sq heparin ppx and aspirin for now, use SCDs, start PPI, consult IR for NGT placement, repeat H&H later this am.
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
## 2020-11-24 NOTE — Progress Notes (Signed)
Physical Therapy Treatment Patient Details Name: Ricky Hanson MRN: [id number] DOB: October 15, 1951 Today's Date: 11/24/2020    History of Present Illness Pt is a 69 y.o. M admitted with AKI stage III with mild hyperkalemia from obstruction due to malplaced Foley catheter. 2023-11-23 pt coded and intubated. Extubated 12/25.  PMHx of schizophrenia, HTN, CKD, [name].    PT Comments    Pt was seen for attempt to get OOB but declines to do more than sit on bed.  He did work on LE ex's, tends to be impulsive initially to get up to side of bed without PT help.  Talked with him about safety but he is not attending to the instructions well.  Has no RLE prosthesis with him, reports it is at home.  Follow acutely to work on progressing to side of bed and then chair to allow PT to help him challenge sitting balance and increase involvement of trunk and LE mm's.     Follow Up Recommendations  SNF     Equipment Recommendations  None recommended by PT    Recommendations for Other Services       Precautions / Restrictions Precautions Precautions: Fall Precaution Comments: cortrak Restrictions Weight Bearing Restrictions: No    Mobility  Bed Mobility Overal bed mobility: Needs Assistance Bed Mobility:  (scooting up bed)     Supine to sit: Min guard Sit to supine: Min guard      Transfers                 General transfer comment: declined OOB  Ambulation/Gait                 Stairs             Wheelchair Mobility    Modified Rankin (Stroke Patients Only)       Balance Overall balance assessment: Needs assistance   Sitting balance-Leahy Scale: Good                                      Cognition Arousal/Alertness: Lethargic Behavior During Therapy: Flat affect Overall Cognitive Status: Difficult to assess Area of Impairment: Problem solving;Awareness;Safety/judgement;Following commands;Memory;Attention;Orientation                  Orientation Level: Situation;Time;Place Current Attention Level: Selective Memory: Decreased recall of precautions;Decreased short-term memory Following Commands: Follows one step commands inconsistently;Follows one step commands with increased time Safety/Judgement: Decreased awareness of deficits;Decreased awareness of safety Awareness: Intellectual Problem Solving: Slow processing;Requires verbal cues;Requires tactile cues General Comments: dense cues for completion of ex and to work on sitting balance      Exercises General Exercises - Lower Extremity Ankle Circles/Pumps: AAROM;5 reps Long Arc Quad: AAROM;10 reps Heel Slides: AAROM;10 reps Hip ABduction/ADduction: AAROM;10 reps    General Comments General comments (skin integrity, edema, etc.): pt reports he is getting up to sit with limited energy to stay up      Pertinent Vitals/Pain Pain Assessment: No/denies pain    Home Living                      Prior Function            PT Goals (current goals can now be found in the care plan section) Acute Rehab PT Goals Patient Stated Goal: none stated x water    Frequency    Min 2X/week  PT Plan Current plan remains appropriate    Co-evaluation              AM-PAC PT "6 Clicks" Mobility   Outcome Measure  Help needed turning from your back to your side while in a flat bed without using bedrails?: A Little Help needed moving from lying on your back to sitting on the side of a flat bed without using bedrails?: A Little Help needed moving to and from a bed to a chair (including a wheelchair)?: A Little Help needed standing up from a chair using your arms (e.g., wheelchair or bedside chair)?: A Lot Help needed to walk in hospital room?: A Lot Help needed climbing 3-5 steps with a railing? : A Lot 6 Click Score: 15    End of Session   Activity Tolerance: Patient limited by lethargy Patient left: in bed;with call bell/phone within reach Nurse  Communication: Mobility status;Other (comment) PT Visit Diagnosis: Difficulty in walking, not elsewhere classified (R26.2)     Time: 8416-6063 PT Time Calculation (min) (ACUTE ONLY): 26 min  Charges:  $Therapeutic Exercise: 8-22 mins $Therapeutic Activity: 8-22 mins                    Ivar Drape 11/24/2020, 4:00 PM  Samul Dada, PT MS Acute Rehab Dept. Number: [id number] [facility] [id number] and [facility] [phone]

## 2020-11-24 NOTE — Progress Notes (Signed)
Entered the patient's room to find him alert with his NGT lying in his bed. Patient remains disoriented but calm. Attempts x4 to replace NGT without success. LWS canister noted to have blood-tinged, brown drainage. BS remain hypoactive/faint. Abdomen non-distended. MD paged.

## 2020-11-24 NOTE — Consult Note (Signed)
NAME:  Ricky Hanson, MRN:  [id number], DOB:  24-Aug-1951, LOS: 15 ADMISSION DATE:  11/08/2020, CONSULTATION DATE:  11/24/2020 REFERRING MD:  Margo Aye - TRH, CHIEF COMPLAINT:  Hydropneumothorax.   HPI/course in hospital  69 year old man who initially presented with urinary obstruction. VF arrest 12/22 for prior to ROSC. Brought to ICU. Slow  Neurological recovery. Working with PT. Course now complicated by SBO possibly due to bowel obstruction.   Called to assess incidental finding of hydropneumothorax found on CTAP.  Past Medical History   Past Medical History:  Diagnosis Date  . Diabetes mellitus without complication (HCC)   . Elevated random blood glucose level   . Foley catheter in place   . Hyperlipidemia   . Hypertension   . Schizophrenia [facility])      Past Surgical History:  Procedure Laterality Date  . ABDOMINAL AORTOGRAM W/LOWER EXTREMITY N/A 05/17/2020   Procedure: ABDOMINAL AORTOGRAM W/LOWER EXTREMITY;  Surgeon: Yates Decamp, MD;  Location: [facility] INVASIVE CV LAB;  Service: Cardiovascular;  Laterality: N/A;  . AMPUTATION Right 05/19/2020   Procedure: RIGHT BELOW KNEE AMPUTATION;  Surgeon: Nadara Mustard, MD;  Location: [facility];  Service: Orthopedics;  Laterality: Right;  . PERIPHERAL VASCULAR BALLOON ANGIOPLASTY  05/17/2020   Procedure: PERIPHERAL VASCULAR BALLOON ANGIOPLASTY;  Surgeon: Yates Decamp, MD;  Location: [facility] INVASIVE CV LAB;  Service: Cardiovascular;;  Right PT  . urologic procedure after trauma       Review of Systems:   Review of Systems  Unable to perform ROS: Patient unresponsive    Social History   reports that he has been smoking cigarettes. He has been smoking about 0.50 packs per day. He has never used smokeless tobacco. He reports that he does not drink alcohol and does not use drugs.   Family History   His family history is not on file.   Allergies No Known Allergies   Home Medications  Prior to Admission medications   Medication Sig Start Date End  Date Taking? Authorizing Provider  acetaminophen (TYLENOL) 500 MG tablet Take 1,000 mg by mouth every 6 (six) hours as needed for mild pain.    [provider]  amLODipine (NORVASC) 10 MG tablet Take 10 mg by mouth daily.    [provider]  aspirin 81 MG chewable tablet Chew 1 tablet (81 mg total) by mouth daily. 05/24/20   Derrel Nip, MD  atorvastatin (LIPITOR) 20 MG tablet Take 1 tablet (20 mg total) by mouth daily. 05/24/20   Derrel Nip, MD  benztropine (COGENTIN) 1 MG tablet Take 1 mg by mouth at bedtime.    [provider]  haloperidol decanoate (HALDOL DECANOATE) 100 MG/ML injection Inject 100 mg into the muscle every 28 (twenty-eight) days. Inject 2 mLs every 4 weeks    [provider]  lisinopril (ZESTRIL) 10 MG tablet Take 10 mg by mouth daily.    [provider]     Interim history/subjective:   Patient denies shortness of breath. RN reports working with PT.  Objective   Blood pressure 104/70, pulse (!) 101, temperature 97.8 F (36.6 C), temperature source Oral, resp. rate 18, height 6\' 1"  (1.854 m), weight 79.8 kg, SpO2 93 %.        Intake/Output Summary (Last 24 hours) at 11/24/2020 1631 Last data filed at 11/24/2020 0700 Gross per 24 hour  Intake 1138.21 ml  Output 2675 ml  Net -1536.79 ml   Filed Weights   11/22/20 0500 11/23/20 0427 11/24/20 0500  Weight: 82.3 kg 82.1 kg 79.8 kg    Examination:  Physical Exam Constitutional:      General: He is not in acute distress.    Appearance: He is not toxic-appearing.  HENT:     Mouth/Throat:     Mouth: Mucous membranes are moist.  Eyes:     General: No scleral icterus.    Conjunctiva/sclera: Conjunctivae normal.  Cardiovascular:     Rate and Rhythm: Normal rate and regular rhythm.  Pulmonary:     Breath sounds: Normal breath sounds.  Abdominal:     General: There is distension.  Musculoskeletal:     Right lower leg: No edema.  Skin:    General: Skin is  warm and dry.  Neurological:     General: No focal deficit present.     Mental Status: He is alert.    Ancillary tests (personally reviewed)  CBC: Recent Labs  Lab 11/20/20 0259 11/21/20 0151 11/22/20 0031 11/23/20 0230 11/24/20 0226 11/24/20 0845 11/24/20 1432  WBC 6.1 16.0* 14.5* 18.9* 10.7*  --   --   HGB 7.2* 8.9* 9.4* 10.6* 8.0* 8.1* 8.6*  HCT 23.0* 27.4* 28.9* 34.1* 26.1* 26.2* 26.8*  MCV 84.2 84.6 85.5 84.6 85.9  --   --   PLT 154 266 272 PLATELET CLUMPS NOTED ON SMEAR, UNABLE TO ESTIMATE 325  --   --     Basic Metabolic Panel: Recent Labs  Lab 11/18/20 0031 11/20/20 0259 11/21/20 0850 11/22/20 0031 11/23/20 0647 11/24/20 0226  NA 144 147* 145 148* 148* 153*  K 3.7 3.9 4.1 4.8 4.1 4.4  CL 114* 116* 115* 118* 115* 115*  CO2 19* 19* 17* 22 22 29   GLUCOSE 131* 99 257* 169* 157* 160*  BUN 28* 17 27* 35* 30* 25*  CREATININE 1.55* 1.63* 1.69* 1.66* 1.39* 1.42*  CALCIUM 8.1* 8.8* 8.4* 8.5* 9.1 8.5*  MG  --   --  2.1  --  2.3  --   PHOS 2.2* 3.0 3.1  --   --   --    GFR: Estimated Creatinine Clearance: 57 mL/min (A) (by C-G formula based on SCr of 1.42 mg/dL (H)). Recent Labs  Lab 11/17/20 1841 11/18/20 0031 11/18/20 0502 11/19/20 1417 11/21/20 0151 11/22/20 0031 11/23/20 0230 11/23/20 0647 11/24/20 0226  PROCALCITON  --   --   --   --   --   --   --  0.11  --   WBC  --    < >  --    < > 16.0* 14.5* 18.9*  --  10.7*  LATICACIDVEN 2.7*  --  2.0*  --   --   --   --  1.0  --    < > = values in this interval not displayed.    Liver Function Tests: Recent Labs  Lab 11/18/20 0031 11/21/20 0850  AST 28 91*  ALT 62* 38  ALKPHOS 95 108  BILITOT 0.7 0.6  PROT 6.0* 6.1*  ALBUMIN 2.1* 2.2*   No results for input(s): LIPASE, AMYLASE in the last 168 hours. No results for input(s): AMMONIA in the last 168 hours.  ABG    Component Value Date/Time   PHART 7.417 11/15/2020 0946   PCO2ART 27.6 (L) 11/15/2020 0946   PO2ART 81 (L) 11/15/2020 0946   HCO3  18.0 (L) 11/15/2020 0946   TCO2 19 (L) 11/15/2020 0946   ACIDBASEDEF 6.0 (H) 11/15/2020 0946   O2SAT 97.0 11/15/2020 0946     Coagulation Profile:  No results for input(s): INR, PROTIME in the last 168 hours.  Cardiac Enzymes: No results for input(s): CKTOTAL, CKMB, CKMBINDEX, TROPONINI in the last 168 hours.  HbA1C: Hgb A1c MFr Bld  Date/Time Value Ref Range Status  11/08/2020 09:39 PM 5.7 (H) 4.8 - 5.6 % Final    Comment:    (NOTE) Pre diabetes:          5.7%-6.4%  Diabetes:              >6.4%  Glycemic control for   <7.0% adults with diabetes   02/02/2020 02:27 PM 5.5 4.8 - 5.6 % Final    Comment:             Prediabetes: 5.7 - 6.4          Diabetes: >6.4          Glycemic control for adults with diabetes: <7.0     CBG: Recent Labs  Lab 11/23/20 2313 11/24/20 0402 11/24/20 0748 11/24/20 1201 11/24/20 1532  GLUCAP 136* 124* 93 142* 177*   CT and CXR personally reviewed: Small <1cm pneumothorax. Small effusion. Evidence of subcutaneous emphysema on 12/24 CXR  Assessment & Plan:   Incidental hydropneumothorax which has developed since admission and is too small to drain.  Pneumothorax likely sequela of cardiopulmonary resuscitation.  Effusion may be related to either the cardiac arrest or to more recent bowel obstruction. Both should resolve over time as patient's condition improves.   Recommend conservative management with repeat imaging if respiratory symptoms develop.   Lynnell Catalan, MD [facility] ICU Physician [facility]  Pager: [phone] Or Epic Secure Chat After hours: [phone].  11/24/2020, 4:31 PM

## 2020-11-24 NOTE — Progress Notes (Signed)
  Speech Language Pathology Treatment: Dysphagia  Patient Details Name: Ricky Hanson MRN: [id number] DOB: [birth year] Today's Date: 11/24/2020 Time: 5573-2202 SLP Time Calculation (min) (ACUTE ONLY): 11 min  Assessment / Plan / Recommendation Clinical Impression  Pt answering basic questions, follows commands intermittently. NG was pulled last night and he has been okayed for clear liquids by surgery.  Repositioned in bed to optimize safety.  Pt asking repeatedly for water. He demonstrated intermittent oral holding of water with eventual swallow.  At other times, he drank so rapidly that the cup had to be physically removed so that he could catch his breath. There were no overt s/s of aspiration; mentation and impulsivity appear to have primary impact on swallowing safety. Required physical assist and verbal cues to slow down and pace himself.  Recommend clear liquid diet until surgery okays diet advancement.  Prior diet was dysphagia 2 and that continues to be safest oral diet once he is able to take solids.    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 He was admitted on 12/15 secondary to confusion and found to have obstructive uropathy and acute renal insufficiency. He developed ventricular fibrillation and cardiac arrest on 12/22. EEG 12/22: severe diffuse encephalopathy; no seizures. 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 No acute insult. Swallow screening was completed, but pt exhibited coughing with liquids and puree. Dys2 diet with thin liquids started 12/29; pt subsequently diagnosed with SBO, cortrak removed and NG placed for suctioning same day.  NG pulled by pt night of 12/30, he had a bowel movement so not replaced.  Okayed  for clears per surgery 12/31.      SLP Plan  Continue with current plan of care       Recommendations  Diet recommendations: Thin liquid Liquids provided via: Cup;Straw Medication Administration: Crushed with puree Supervision: Staff to assist with self feeding Compensations: Slow rate;Small sips/bites                Oral Care Recommendations: Oral care BID SLP Visit Diagnosis: Dysphagia, unspecified (R13.10) Plan: Continue with current plan of care       GO                Blenda Mounts Laurice 11/24/2020, 10:47 AM  Marchelle Folks L. Samson Frederic, MA CCC/SLP Acute Rehabilitation Services Office number [phone]

## 2020-11-25 ENCOUNTER — Inpatient Hospital Stay (HOSPITAL_COMMUNITY): Payer: Medicare Other

## 2020-11-25 DIAGNOSIS — N179 Acute kidney failure, unspecified: Secondary | ICD-10-CM | POA: Diagnosis not present

## 2020-11-25 LAB — BASIC METABOLIC PANEL
Anion gap: 12 (ref 5–15)
BUN: 19 mg/dL (ref 8–23)
CO2: 24 mmol/L (ref 22–32)
Calcium: 8.2 mg/dL — ABNORMAL LOW (ref 8.9–10.3)
Chloride: 112 mmol/L — ABNORMAL HIGH (ref 98–111)
Creatinine, Ser: 1.55 mg/dL — ABNORMAL HIGH (ref 0.61–1.24)
GFR, Estimated: 49 mL/min — ABNORMAL LOW (ref >60)
Glucose, Bld: 182 mg/dL — ABNORMAL HIGH (ref 70–99)
Potassium: 4.2 mmol/L (ref 3.5–5.1)
Sodium: 148 mmol/L — ABNORMAL HIGH (ref 135–145)

## 2020-11-25 LAB — CBC
HCT: 24.2 % — ABNORMAL LOW (ref 39.0–52.0)
Hemoglobin: 7.4 g/dL — ABNORMAL LOW (ref 13.0–17.0)
MCH: 26.3 pg (ref 26.0–34.0)
MCHC: 30.6 g/dL (ref 30.0–36.0)
MCV: 86.1 fL (ref 80.0–100.0)
Platelets: 295 10*3/uL (ref 150–400)
RBC: 2.81 MIL/uL — ABNORMAL LOW (ref 4.22–5.81)
RDW: 17.1 % — ABNORMAL HIGH (ref 11.5–15.5)
WBC: 8.6 10*3/uL (ref 4.0–10.5)
nRBC: 0.5 % — ABNORMAL HIGH (ref 0.0–0.2)

## 2020-11-25 LAB — GLUCOSE, CAPILLARY
Glucose-Capillary: 155 mg/dL — ABNORMAL HIGH (ref 70–99)
Glucose-Capillary: 120 mg/dL — ABNORMAL HIGH (ref 70–99)
Glucose-Capillary: 110 mg/dL — ABNORMAL HIGH (ref 70–99)
Glucose-Capillary: 164 mg/dL — ABNORMAL HIGH (ref 70–99)
Glucose-Capillary: 195 mg/dL — ABNORMAL HIGH (ref 70–99)

## 2020-11-25 LAB — HEMOGLOBIN AND HEMATOCRIT, BLOOD
HCT: 24.1 % — ABNORMAL LOW (ref 39.0–52.0)
Hemoglobin: 7.3 g/dL — ABNORMAL LOW (ref 13.0–17.0)

## 2020-11-25 MED ORDER — ADULT MULTIVITAMIN W/MINERALS CH
1.0000 | ORAL_TABLET | Freq: Every day | ORAL | Status: DC
  Administered 2020-11-26 – 2020-11-30 (×3): 1 via ORAL
  Filled 2020-11-25 (×5): qty 1

## 2020-11-25 MED ORDER — PROSOURCE PLUS PO LIQD
30.0000 mL | Freq: Two times a day (BID) | ORAL | Status: DC
  Administered 2020-11-25 – 2020-11-26 (×2): 30 mL via ORAL
  Filled 2020-11-25 (×5): qty 30

## 2020-11-25 MED ORDER — BOOST / RESOURCE BREEZE PO LIQD CUSTOM
1.0000 | Freq: Three times a day (TID) | ORAL | Status: DC
  Administered 2020-11-25 – 2020-11-26 (×4): 1 via ORAL

## 2020-11-25 NOTE — Plan of Care (Signed)
  Problem: Education: Goal: Knowledge of disease and its progression will improve Outcome: Progressing   Problem: Health Behavior/Discharge Planning: Goal: Ability to manage health-related needs will improve Outcome: Progressing   Problem: Clinical Measurements: Goal: Complications related to the disease process or treatment will be avoided or minimized Outcome: Progressing   Problem: Activity: Goal: Activity intolerance will improve Outcome: Progressing   Problem: Fluid Volume: Goal: Fluid volume balance will be maintained or improved Outcome: Progressing   Problem: Nutritional: Goal: Ability to make appropriate dietary choices will improve Outcome: Progressing   Problem: Respiratory: Goal: Respiratory symptoms related to disease process will be avoided Outcome: Progressing   Problem: Urinary Elimination: Goal: Progression of disease will be identified and treated Outcome: Progressing   Problem: Education: Goal: Ability to demonstrate management of disease process will improve Outcome: Progressing Goal: Ability to verbalize understanding of medication therapies will improve Outcome: Progressing   Problem: Activity: Goal: Capacity to carry out activities will improve Outcome: Progressing   Problem: Cardiac: Goal: Ability to achieve and maintain adequate cardiopulmonary perfusion will improve Outcome: Progressing   Problem: Education: Goal: Knowledge of General Education information will improve Description: Including pain rating scale, medication(s)/side effects and non-pharmacologic comfort measures Outcome: Progressing   Problem: Health Behavior/Discharge Planning: Goal: Ability to manage health-related needs will improve Outcome: Progressing   Problem: Clinical Measurements: Goal: Ability to maintain clinical measurements within normal limits will improve Outcome: Progressing Goal: Will remain free from infection Outcome: Progressing Goal: Diagnostic  test results will improve Outcome: Progressing Goal: Respiratory complications will improve Outcome: Progressing Goal: Cardiovascular complication will be avoided Outcome: Progressing   Problem: Activity: Goal: Risk for activity intolerance will decrease Outcome: Progressing   Problem: Nutrition: Goal: Adequate nutrition will be maintained Outcome: Progressing   Problem: Coping: Goal: Level of anxiety will decrease Outcome: Progressing   Problem: Elimination: Goal: Will not experience complications related to bowel motility Outcome: Progressing Goal: Will not experience complications related to urinary retention Outcome: Progressing   Problem: Pain Managment: Goal: General experience of comfort will improve Outcome: Progressing   Problem: Safety: Goal: Ability to remain free from injury will improve Outcome: Progressing   Problem: Skin Integrity: Goal: Risk for impaired skin integrity will decrease Outcome: Progressing

## 2020-11-25 NOTE — Progress Notes (Signed)
Nutrition Follow-up  DOCUMENTATION CODES:   Not applicable  INTERVENTION:   Boost Breeze po TID, each supplement provides 250 kcal and 9 grams of protein  69ml Prosource Plus po BID, each supplement provides 100 kcals and 15 grams of protein  MVI with minerals daily  Once diet is advanced, recommend d/c Boost Breeze and ordering more nutritionally-dense supplement such as Ensure Enlive.   NUTRITION DIAGNOSIS:   Inadequate oral intake related to dysphagia as evidenced by other (comment) (clear liquid diet).  updated  GOAL:   Patient will meet greater than or equal to 90% of their needs  progressing  MONITOR:   Diet advancement,Supplement acceptance,Weight trends,Labs,PO intake,I & O's  REASON FOR ASSESSMENT:   Consult Enteral/tube feeding initiation and management  ASSESSMENT:   70 yo male admitted with obstructed uropathy, acute renal insufficiency, UTI. Intubated and transferred to the ICU early 12/22 s/p V fib cardiac arrest. PMH includes DM, HLD, HTN, schizophrenia, [name].   12/25 extubated 12/27 Cortrak placed (gastric tip) 12/29 pt passed BSE with recommendations for dysphagia 2 diet w/ thin liquids; KUB ordered due to pt pulling at Cortrak and KUB revealed SBO 12/30 pt found to have inguinal hernia which was reduced by CCM; Cortrak removed and replaced by NGT to LIS 12/31 pt pulled NGT overnight; attempts at replacing were unsuccessful; diet advanced to clear liquids  Pt developed SBO, possibly due to bowel obstruction. Pt also found to have hydropneumothorax found on CTAP (too small to drain per MD) and small effusion. Per CCM, pneumothorax likely sequela of cardiopulmonary resuscitation and the effusion may be related to either the cardiac arrest or SBO. Decision was made to treat with conservative measures.   Pt pulled out NGT yesterday and attempts at replacing were ultimately unsuccessful. GI noted that the partial SBO had resolved, so determined it was  okay to leave the NGT out and to advance pt's diet to clear liquids. Plan to perform EGD only if there is active bleeding. GI on standby.   RN reports pt tolerating clear liquid diet. Pt denies N/V and abdominal pain. No PO intake documented since diet was advanced.   Admit wt: 77.9 kg Current wt: 79.8 kg  Pt noted to have mild pitting generalized edema per RN assessment.   UOP: documented x 24 hours   Labs: Na 148 (H), CBGs 110-155 Medications: ss novolog Q4H, remeron  Diet Order:   Diet Order            Diet clear liquid Room service appropriate? Yes; Fluid consistency: Thin  Diet effective now                 EDUCATION NEEDS:   No education needs have been identified at this time  Skin:  Skin Assessment: Reviewed RN Assessment  Last BM:  12/30  Height:   Ht Readings from Last 1 Encounters:  11/09/20 6\' 1"  (1.854 m)    Weight:   Wt Readings from Last 1 Encounters:  11/24/20 79.8 kg    Ideal Body Weight:  78.3 kg  BMI:  Body mass index is 23.21 kg/m.  Estimated Nutritional Needs:   Kcal:  2200-2400  Protein:  120-135 gm  Fluid:  >/= 2 L    11/26/20, MS, RD, LDN RD pager number and weekend/on-call pager number located in Amion.

## 2020-11-25 NOTE — Progress Notes (Signed)
PROGRESS NOTE  Ricky Hanson EHM:[id number] DOB: 09/21/1951 DOA: 11/08/2020 PCP: Hoy Register, MD  HPI/Recap of past 24 hours: Ricky Hanson is a 70 y.o. male with PMH significant for DM2, HTN, HLD, schizophrenia. Patient was initially admitted to the hospital on 12/15 for AKI, obstructive uropathy.  On 12/22, patient developed V. fib and cardiac arrest, was pulseless for about 20 minutes, successfully resuscitated, intubated transferred to ICU. 12/24, MRI brain showed a small subacute to chronic white matter infarct in the subcortical right frontal lobe with no acute insult. Extubated when?? 12/27, transferred out of ICU to hospitalist service.  Vomiting the early morning of 11/23/2020, abdominal x-ray showed SBO.  A CT abdomen and pelvis was obtained showing worsening distal bowel obstruction and mild right basilar hydropneumothorax.  NG tube was placed and on 11/23/2020 however the patient removed his NG tube multiple times. General surgery was consulted. Patient had a left inguinal hernia that was reduced on 11/23/2020.  Abdominal x-ray showed improvement of SBO on 11/24/20. Chest x-ray shows small apical right pneumothorax, seen by PCCM.  Recommended to closely monitor, no indication for chest tube placement.   11/25/20: Patient was seen and examined at his bedside.  His abdomen is soft.  He denies any tenderness with palpation.  He has hypoactive bowel sounds.  He was started on a clear liquid diet and is tolerating.  Denies any nausea.    He had an acute drop in his hemoglobin this morning.  Seen by GI on 11/24/2020 due to concern for upper GI bleed.  He is currently on IV PPI twice daily.  We will continue to monitor and transfuse with hemoglobin less than 7.0.  Assessment/Plan: Principal Problem:   ARF (acute renal failure) (HCC) Active Problems:   Chronic indwelling Foley catheter   Peripheral vascular disease (HCC)   CKD (chronic kidney disease), stage III (HCC)    Essential hypertension   AKI (acute kidney injury) (HCC)   Cardiac arrest (HCC)  Acute hypoxic respiratory failure due to aspiration pneumonia Status post VT/VF cardiac arrest - ~28mins downtime and the bilateral pleural effusions. Not on oxygen supplementation at baseline Currently on 2 L with O2 saturation of 96%. -Completed 5-day course of IV Zosyn on 12/26 for aspiration pneumonia. Start incentive spirometer and flutter valve.  Resolving SBO on abdominal x-ray and CT scan General surgery consulted and following. Clear liquid diet started on 11/24/2020, continue to monitor for any worsening symptoms.  Advance as tolerated. Repeated abdominal x-ray done on 11/24/2020 shows improvement of SBO. IV fluid has been stopped this morning due to volume overload, acute hypoxia, and bilateral pleural effusions worse on the right.  Bilateral pleural effusions with concern for volume overload, worse on the right/atelectasis. Elevated BNP greater than 1000. DC IV fluid for now Start incentive spirometer and flutter valve Maintain O2 saturation greater than 90%  Small right apical pneumothorax seen on chest x-ray done on 11/24/2020 Mild right basilar hydropneumothorax. Requested consult from PCCM via secure chat.  Left inguinal hernia, reduced by general surgery on 11/23/2020 Will likely need to follow-up with general surgery outpatient.  Acute metabolic encephalopathy, improving -Intermittent delirium, delirium precautions. At the time of this exam the patient is alert and calm.  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 -Prior to admission, patient was on benztropine 1 mg nightly and long-acting Haldol subcutaneous monthly. -Continue to monitor mental status. -Neurology recommended continuation of Keppra 500 mg twice daily. If patient has clinical seizure,  recommendation is to increase it to 1000 mg daily. Patient  is to follow-up with neurology as an outpatient in 8 to 10 weeks of discharge. If remains seizure-free, can consider repeat EEG and weaning off [place] outpatient. 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 -MRA head and bilateral carotid duplex unremarkable. -Discussed with neurology. -TTE showed normal ejection fraction without cardiac source of embolism. Aspirin was held due to gastric fluid positive for blood and acute drop in hemoglobin.  Resume statin.  Concern for upper GI bleed in the setting of acute on chronic anemia Acute drop in hemoglobin from 10-8 to 7.3 this morning. Gastric fluid positive for blood Continue IV PPI twice daily Monitor with hemoglobin less than 7.0. GI consulted, appreciate recommendations  Acute kidney injury on CKD stage IIIa suspect prerenal in the setting of dehydration from poor intake. Baseline creatinine appears to be 1.3 with GFR of 56. Creatinine uptrending 1.55 from 1.4  Previously had a creatinine of 1.69. Continue to avoid nephrotoxins  Hyperglycemia -A1c 5.7 on 11/08/20.   -Not on diabetes medications at home. Currently on Lantus 5 units daily. Continue sliding scale insulin with Accu-Cheks.  Intermittent sinus tachycardia, suspect driven by lung physiology TSH 0.597 Heart rate in the low 100 Continue to monitor  Essential hypertension BP is currently at goal Continue lower dose of Coreg 3.125 mg twice daily to avoid beta-blockade withdrawal. Continue to monitor vital signs  Physical debility/ambulatory dysfunction PT assessment recommended SNF Continue PT OT with assistance and fall precautions.  Mobility: PT evaluation recommended SNF. Code Status:  Code Status: Full Code  Nutritional status: Body mass index is 25.22 kg/m. Nutrition Problem: Inadequate oral  intake Etiology: dysphagia Signs/Symptoms: NPO status    Diet Order                  Diet NPO time specified  Diet effective now                  DVT prophylaxis: heparin injection 5,000 Units Start: 11/18/20 1400 SCDs Start: 11/15/20 0241   Antimicrobials: Completed the course of Zosyn on 12/26 Fluid: None Consultants: Neurology Family Communication:  Updated his son via phone on 11/23/2020.    Remains inpatient appropriate because:Altered mental status    Status is: Inpatient    Dispo:  Patient From: Home  Planned Disposition: [place]  Expected discharge date: 11/27/2020  Medically stable for discharge: No, management of SBO.        Objective: Vitals:   11/25/20 0513 11/25/20 0757 11/25/20 0828 11/25/20 1155  BP: 103/69 107/73  109/70  Pulse: (!) 106 96  93  Resp: 20 18  18   Temp: 98 F (36.7 C) 98 F (36.7 C)  98 F (36.7 C)  TempSrc: Oral     SpO2: 97% 100% 95% 96%  Weight:      Height:        Intake/Output Summary (Last 24 hours) at 11/25/2020 1433 Last data filed at 11/25/2020 1100 Gross per 24 hour  Intake 60 ml  Output 700 ml  Net -640 ml   Filed Weights   11/22/20 0500 11/23/20 0427 11/24/20 0500  Weight: 82.3 kg 82.1 kg 79.8 kg    Exam:  . General: 70 y.o. year-old male well-developed well-nourished in no acute distress.  Alert and interactive.   Marland Kitchen  Cardiovascular: Regular rate and rhythm no rubs or gallops. Marland Kitchen Respiratory: Mild rales at bases no wheezing noted.  Poor inspiratory effort.   . Abdomen: Soft hypoactive bowel sounds present.   . Musculoskeletal: Right below the knee amputation.  Trace lower extremity edema in left lower extremity. Marland Kitchen Psychiatry: Mood is appropriate for condition and setting.   Data Reviewed: CBC: Recent Labs  Lab 11/21/20 0151 11/22/20 0031 11/23/20 0230 11/24/20 0226 11/24/20 0845 11/24/20 1432 11/24/20 2221 11/25/20 0548  WBC 16.0* 14.5* 18.9* 10.7*  --   --   --   8.6  HGB 8.9* 9.4* 10.6* 8.0* 8.1* 8.6* 7.7* 7.4*  HCT 27.4* 28.9* 34.1* 26.1* 26.2* 26.8* 23.9* 24.2*  MCV 84.6 85.5 84.6 85.9  --   --   --  86.1  PLT 266 272 PLATELET CLUMPS NOTED ON SMEAR, UNABLE TO ESTIMATE 325  --   --   --  295   Basic Metabolic Panel: Recent Labs  Lab 11/20/20 0259 11/21/20 0850 11/22/20 0031 11/23/20 0647 11/24/20 0226 11/25/20 0548  NA 147* 145 148* 148* 153* 148*  K 3.9 4.1 4.8 4.1 4.4 4.2  CL 116* 115* 118* 115* 115* 112*  CO2 19* 17* 22 22 29 24   GLUCOSE 99 257* 169* 157* 160* 182*  BUN 17 27* 35* 30* 25* 19  CREATININE 1.63* 1.69* 1.66* 1.39* 1.42* 1.55*  CALCIUM 8.8* 8.4* 8.5* 9.1 8.5* 8.2*  MG  --  2.1  --  2.3  --   --   PHOS 3.0 3.1  --   --   --   --    GFR: Estimated Creatinine Clearance: 52.2 mL/min (A) (by C-G formula based on SCr of 1.55 mg/dL (H)). Liver Function Tests: Recent Labs  Lab 11/21/20 0850  AST 91*  ALT 38  ALKPHOS 108  BILITOT 0.6  PROT 6.1*  ALBUMIN 2.2*   No results for input(s): LIPASE, AMYLASE in the last 168 hours. No results for input(s): AMMONIA in the last 168 hours. Coagulation Profile: No results for input(s): INR, PROTIME in the last 168 hours. Cardiac Enzymes: No results for input(s): CKTOTAL, CKMB, CKMBINDEX, TROPONINI in the last 168 hours. BNP (last 3 results) No results for input(s): PROBNP in the last 8760 hours. HbA1C: No results for input(s): HGBA1C in the last 72 hours. CBG: Recent Labs  Lab 11/24/20 1532 11/24/20 2039 11/25/20 0509 11/25/20 0753 11/25/20 1154  GLUCAP 177* 154* 155* 120* 110*   Lipid Profile: No results for input(s): CHOL, HDL, LDLCALC, TRIG, CHOLHDL, LDLDIRECT in the last 72 hours. Thyroid Function Tests: No results for input(s): TSH, T4TOTAL, FREET4, T3FREE, THYROIDAB in the last 72 hours. Anemia Panel: No results for input(s): VITAMINB12, FOLATE, FERRITIN, TIBC, IRON, RETICCTPCT in the last 72 hours. Urine analysis:    Component Value Date/Time   COLORURINE  AMBER (A) 11/08/2020 1800   APPEARANCEUR TURBID (A) 11/08/2020 1800   LABSPEC 1.010 11/08/2020 1800   PHURINE 6.0 11/08/2020 1800   GLUCOSEU NEGATIVE 11/08/2020 1800   HGBUR MODERATE (A) 11/08/2020 1800   BILIRUBINUR NEGATIVE 11/08/2020 1800   KETONESUR NEGATIVE 11/08/2020 1800   PROTEINUR 30 (A) 11/08/2020 1800   NITRITE NEGATIVE 11/08/2020 1800   LEUKOCYTESUR LARGE (A) 11/08/2020 1800   Sepsis Labs: @LABRCNTIP (procalcitonin:4,lacticidven:4)  ) No results found for this or any previous visit (from the past 240 hour(s)).    Studies: No results found.  Scheduled Meds: . (feeding supplement) PROSource Plus  30 mL Oral BID BM  . sodium chloride  Intravenous Once  . sodium chloride   Intravenous Once  . atorvastatin  40 mg Per NG tube Daily  . benztropine  1 mg Per NG tube QHS  . carvedilol  3.125 mg Per NG tube BID WC  . chlorhexidine  15 mL Mouth Rinse BID  . Chlorhexidine Gluconate Cloth  6 each Topical Daily  . feeding supplement  1 Container Oral TID BM  . insulin aspart  0-9 Units Subcutaneous Q4H  . mouth rinse  15 mL Mouth Rinse q12n4p  . mirtazapine  15 mg Per NG tube QHS  . multivitamin with minerals  1 tablet Oral Daily  . pantoprazole (PROTONIX) IV  40 mg Intravenous Q12H  . sodium chloride flush  10-40 mL Intracatheter Q12H  . sodium chloride flush  10-40 mL Intracatheter Q12H    Continuous Infusions: . sodium chloride 250 mL (11/25/20 0643)  . sodium chloride 10 mL/hr at 11/18/20 1900  . levETIRAcetam 500 mg (11/25/20 0606)     LOS: 16 days     Darlin Drop, MD [facility] Pager [phone]  If 7PM-7AM, please contact night-coverage [email or web address] Password [facility] 11/25/2020, 2:33 PM

## 2020-11-26 DIAGNOSIS — Z515 Encounter for palliative care: Secondary | ICD-10-CM | POA: Diagnosis not present

## 2020-11-26 DIAGNOSIS — Z7189 Other specified counseling: Secondary | ICD-10-CM

## 2020-11-26 DIAGNOSIS — N179 Acute kidney failure, unspecified: Secondary | ICD-10-CM | POA: Diagnosis not present

## 2020-11-26 LAB — BASIC METABOLIC PANEL
Anion gap: 12 (ref 5–15)
BUN: 23 mg/dL (ref 8–23)
CO2: 23 mmol/L (ref 22–32)
Calcium: 8.2 mg/dL — ABNORMAL LOW (ref 8.9–10.3)
Chloride: 114 mmol/L — ABNORMAL HIGH (ref 98–111)
Creatinine, Ser: 2.01 mg/dL — ABNORMAL HIGH (ref 0.61–1.24)
GFR, Estimated: 36 mL/min — ABNORMAL LOW (ref >60)
Glucose, Bld: 126 mg/dL — ABNORMAL HIGH (ref 70–99)
Potassium: 4.2 mmol/L (ref 3.5–5.1)
Sodium: 149 mmol/L — ABNORMAL HIGH (ref 135–145)

## 2020-11-26 LAB — CBC
HCT: 23.1 % — ABNORMAL LOW (ref 39.0–52.0)
Hemoglobin: 7 g/dL — ABNORMAL LOW (ref 13.0–17.0)
MCH: 26.9 pg (ref 26.0–34.0)
MCHC: 30.3 g/dL (ref 30.0–36.0)
MCV: 88.8 fL (ref 80.0–100.0)
Platelets: 277 10*3/uL (ref 150–400)
RBC: 2.6 MIL/uL — ABNORMAL LOW (ref 4.22–5.81)
RDW: 17.2 % — ABNORMAL HIGH (ref 11.5–15.5)
WBC: 9 10*3/uL (ref 4.0–10.5)
nRBC: 0.9 % — ABNORMAL HIGH (ref 0.0–0.2)

## 2020-11-26 LAB — GLUCOSE, CAPILLARY
Glucose-Capillary: 112 mg/dL — ABNORMAL HIGH (ref 70–99)
Glucose-Capillary: 121 mg/dL — ABNORMAL HIGH (ref 70–99)
Glucose-Capillary: 122 mg/dL — ABNORMAL HIGH (ref 70–99)
Glucose-Capillary: 143 mg/dL — ABNORMAL HIGH (ref 70–99)
Glucose-Capillary: 146 mg/dL — ABNORMAL HIGH (ref 70–99)

## 2020-11-26 LAB — PREPARE RBC (CROSSMATCH)

## 2020-11-26 MED ORDER — DEXTROSE-NACL 5-0.45 % IV SOLN: INTRAVENOUS | Status: DC

## 2020-11-26 MED ORDER — SODIUM CHLORIDE 0.9% IV SOLUTION
Freq: Once | INTRAVENOUS | Status: AC
  Administered 2020-11-26: 10 mL/h via INTRAVENOUS
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
## 2020-11-26 NOTE — Progress Notes (Signed)
Blood transfusion started at [facility]
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
## 2020-11-26 NOTE — Progress Notes (Signed)
  Speech Language Pathology Treatment: Dysphagia  Patient Details Name: Ricky Hanson MRN: [id number] DOB: 1951/11/13 Today's Date: 11/26/2020 Time: 4656-8127 SLP Time Calculation (min) (ACUTE ONLY): 20 min  Assessment / Plan / Recommendation Clinical Impression  Patient seen to address dysphagia goals as nursing had earlier reported patient holding liquids/solids in mouth and not demonstrating swallow initiation, and so he was made NPO. Patient had eyes closed and was lethargic but became adequately alert during oral care and slight repositioning in bed. He consumed cup sips of honey thick liquids, then thin liquids (water) with mild amount of holding bolus in mouth prior to swallows. He exhibited one instance of delayed cough/gag response but voice remained clear and no further overt s/s. Per RN, patient had exhibited the gag response another time as well and that it might be related to his small bowel obstruction. SLP is recommending to allow patient to have floor stock thin liquids and puree solids with supervision and assistance when he is alert and able to actively participate, but to remain NPO in terms of full diet. Per RN report and SLP notes, patient is inconsistent with timely swallows (sometimes holding bolus in mouth, other times impulsively drinking from cup) which appears related to his cognitive dysfunction.    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 He was admitted on 12/15 secondary to confusion and found to have obstructive uropathy and acute renal insufficiency. He developed ventricular fibrillation and cardiac arrest on 12/22. EEG 12/22: severe diffuse encephalopathy; no seizures. ETT 12/22-12/25 (11:40) MRI 12/24: Small subacute to chronic white matter infarct in the subcortical right frontal  lobe. No acute insult. Swallow screening was completed, but pt exhibited coughing with liquids and puree. Dys2 diet with thin liquids started 12/29; pt subsequently diagnosed with SBO, cortrak removed and NG placed for suctioning same day.  NG pulled by pt night of 12/30, he had a bowel movement so not replaced.  Okayed for clears per surgery 12/31.      SLP Plan  Continue with current plan of care       Recommendations  Diet recommendations: NPO but allow Floor stock Thin liquid;Dysphagia 1 (puree) Liquids provided via: Cup Medication Administration: Crushed with puree Supervision: Staff to assist with self feeding Compensations: Slow rate;Small sips/bites Postural Changes and/or Swallow Maneuvers: Seated upright 90 degrees;Upright 30-60 min after meal                Oral Care Recommendations: Oral care BID Follow up Recommendations: Skilled Nursing facility;24 hour supervision/assistance SLP Visit Diagnosis: Dysphagia, unspecified (R13.10) Plan: Continue with current plan of care       GO           Angela Nevin, MA, CCC-SLP Speech Therapy [facility]

## 2020-11-26 NOTE — Progress Notes (Signed)
Still with significant choking at times when eating.  Now NPO but able to take floor stock items as desired. 1 unit of blood given today. Fluids running at 30 and bed alarms on.

## 2020-11-26 NOTE — Consult Note (Cosign Needed Addendum)
Palliative Medicine Inpatient Consult Note  Reason for consult:  Goals of Care  HPI:  Per intake H&P --> Ricky Hanson a 70 y.o.malewith PMH significant for DM2, HTN, HLD, schizophrenia.  Patient was initially admitted to the hospital on 12/15 for AKI, obstructive uropathy.  On 12/22, patient developed V. fib and cardiac arrest, was pulseless for about 20 minutes, successfully resuscitated, intubated and transferred to ICU.  12/24, MRI brain showed a small subacute to chronic white matter infarct in the subcortical right frontal lobe with no acute insult.  Transferred out of ICU to hospitalist service on 11/20/20. Found to have an SBO on 12/30 and apical pneumothorax both for which conservative measures were taken.   Clinical Assessment/Goals of Care: I have reviewed medical records including EPIC notes, labs and imaging, received report from bedside RN, assessed the patient who was disoriented.    I called Ricky Hanson (son) though he asked to call back and we were unable to connect thereafter. I then called patients cousin, Ricky Hanson to further discuss diagnosis prognosis, GOC, EOL wishes, disposition and options.   I introduced Palliative Medicine as specialized medical care for people living with serious illness. It focuses on providing relief from the symptoms and stress of a serious illness. The goal is to improve quality of life for both the patient and the family.  Ricky Hanson shares with me that Ricky Hanson is from [place], [place]. He has never been married and has one son, Ricky Hanson. 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 He has struggled with his schizophrenia throughout his life and had relocated to [place], [place] up until October 2020 when he moved up to [place] to live with Ricky Hanson. He is a faithful man and practices within the baptist denomination.  Until June 2021 Ricky Hanson was functional and able to perform  his bADLs. Ricky Hanson shares that he would wake up and help verify things were "in order", but otherwise Ricky Hanson was self sufficient. In the setting of his worsening vascular disease and in June Ricky Hanson he has been in and out of the hospital. He has been at [facility] and was apparently doing fairly well prior to his admission on December 15th.  A detailed discussion was had today regarding advanced directives - there are none formally on file though Ricky Hanson's next of kin is his son, Ricky Hanson. Per Ricky Hanson whatever the medical team tells him he relays to Ricky Hanson as it can be difficult to get into contact with him sometimes in the setting of working for [employer] and living in [place].   Concepts specific to code status, artifical feeding and hydration, continued IV antibiotics and rehospitalization was had.  I shared with Ricky Hanson that if Ricky Hanson were to undergo another arrest his outcomes would likely be poor. It appears that he has already suffered tremendously from the first event and I would worry that we would potential cause Ricky Hanson greater harm if ever he had to go through another event. I strongly recommended consideration of DNAR/DNI code status.   The difference between a aggressive medical intervention path  and a palliative comfort care path for this patient at this time was had. I shared with Ricky Hanson that we should really start thinking about what the future may look like for Ricky Hanson and what his functional recovery will look like. I shared that he will likely now need support at a facility for the rest of  his time and his mentation will likely not return to his prior baseline. We further discussed cardiac arrest and how every minute places you at higher risk for devastating consequences cognitively and functionally.    We reviewed Ricky Hanson's eighteen day hospital course inclusive of his cardiac arrest, GIB requiring blood transfusions, SBO, AKI, CHF, pneumothorax, CVA, and dysphagia progression into  hypoxia and aspiration PNA .  We discussed the pathways of aspiration pneumonia in the setting of dysphagia and how the coordination in Ricky Hanson case is likely impaired. We discussed recurrent aspirational events and their long term outlooks. We discussed that recurrent aspiration can lead to a terminal diagnosis. Ricky Hanson understood this. He shares that he would like to help coordinate a meeting between he and Ricky Hanson on a conference call to make further decisions. I shared that I am not back tomorrow but I will request one of my colleagues follows up to coordinate a telephone conference meeting time.   Discussed the importance of continued conversation with family and their  medical providers regarding overall plan of care and treatment options, ensuring decisions are within the context of the patients values and GOCs.  Decision Maker: Ricky Hanson (son) [phone]  SUMMARY OF RECOMMENDATIONS   Full Code / Full Scope of Treatment for the time being --> Recommended consideration of DNAR/DNI in the setting of chronic co-morbidities and complications already seen post arrest  Palliative care is trying to coordinate a telephone meeting amongst patients son, Ricky Hanson and cousin, Ricky Hanson to further make decisions on goals moving forward  Patient would benefit from spiritual support he is [facility]  Ongoing support  Code Status/Advance Care Planning: FULL CODE   Palliative Prophylaxis:   Oral Care, Turn Q2H, Delirium and aspiration precuations  Additional Recommendations (Limitations, Scope, Preferences):  Continue current scope of care   Psycho-social/Spiritual:   Desire for further Chaplaincy support: Yes - [employer]  Additional Recommendations: Education on anoxia post arrest and the long term consequences   Prognosis: Exceptionally poor in the setting of chronic and acute disease processes. Would be reasonable to consider hospice care.  Discharge Planning: Unclear  Vitals:   11/26/20 1100  11/26/20 1341  BP: 113/72 115/68  Pulse: 86 87  Resp: 18 19  Temp: 97.9 F (36.6 C) 98.9 F (37.2 C)  SpO2: 99% 93%    Intake/Output Summary (Last 24 hours) at 11/26/2020 1343 Last data filed at 11/26/2020 1113 Gross per 24 hour  Intake 815.83 ml  Output 400 ml  Net 415.83 ml   Last Weight  Most recent update: 11/26/2020  5:27 AM   Weight  84.3 kg (185 lb 13.6 oz)           Gen:  Older AA M HEENT: Poor dentition, moist mucous membranes CV: Regular rate and rhythm  PULM: 2LPM , ABD: soft/nontender  EXT: Ricky Hanson Neuro:  Opens eyes delayed inappropriate response  PPS: 10-20%   This conversation/these recommendations were discussed with patient primary care team, Dr. Margo Aye  Time In: 1600 Time Out: 1710 Total Time: 70 Greater than 50%  of this time was spent counseling and coordinating care related to the above assessment and plan.  Lamarr Lulas [place] Palliative Medicine Team Team Cell Phone: [phone] Please utilize secure chat with additional questions, if there is no response within 30 minutes please call the above phone number  Palliative Medicine Team providers are available by phone from 7am to 7pm daily and can be reached through the team cell phone.  Should this patient  require assistance outside of these hours, please call the patient's attending physician.

## 2020-11-26 NOTE — Progress Notes (Signed)
Patient unable to swallow meds crushed in applesauce.  Will not swallow and cannot suck out of a straw.  Notified MD and orders received for NPO diet.

## 2020-11-27 ENCOUNTER — Inpatient Hospital Stay (HOSPITAL_COMMUNITY): Payer: Medicare Other

## 2020-11-27 DIAGNOSIS — N179 Acute kidney failure, unspecified: Secondary | ICD-10-CM | POA: Diagnosis not present

## 2020-11-27 LAB — BLOOD GAS, ARTERIAL
Acid-base deficit: 0.7 mmol/L (ref 0.0–2.0)
Bicarbonate: 23.5 mmol/L (ref 20.0–28.0)
FIO2: 28
O2 Saturation: 94.3 %
Patient temperature: 37
pCO2 arterial: 39.4 mmHg (ref 32.0–48.0)
pH, Arterial: 7.393 (ref 7.350–7.450)
pO2, Arterial: 76.2 mmHg — ABNORMAL LOW (ref 83.0–108.0)

## 2020-11-27 LAB — CBC
HCT: 31.1 % — ABNORMAL LOW (ref 39.0–52.0)
Hemoglobin: 9.5 g/dL — ABNORMAL LOW (ref 13.0–17.0)
MCH: 26.5 pg (ref 26.0–34.0)
MCHC: 30.5 g/dL (ref 30.0–36.0)
MCV: 86.9 fL (ref 80.0–100.0)
Platelets: 303 10*3/uL (ref 150–400)
RBC: 3.58 MIL/uL — ABNORMAL LOW (ref 4.22–5.81)
RDW: 17.7 % — ABNORMAL HIGH (ref 11.5–15.5)
WBC: 11.5 10*3/uL — ABNORMAL HIGH (ref 4.0–10.5)
nRBC: 0.6 % — ABNORMAL HIGH (ref 0.0–0.2)

## 2020-11-27 LAB — GLUCOSE, CAPILLARY
Glucose-Capillary: 113 mg/dL — ABNORMAL HIGH (ref 70–99)
Glucose-Capillary: 117 mg/dL — ABNORMAL HIGH (ref 70–99)
Glucose-Capillary: 122 mg/dL — ABNORMAL HIGH (ref 70–99)
Glucose-Capillary: 127 mg/dL — ABNORMAL HIGH (ref 70–99)
Glucose-Capillary: 129 mg/dL — ABNORMAL HIGH (ref 70–99)
Glucose-Capillary: 133 mg/dL — ABNORMAL HIGH (ref 70–99)
Glucose-Capillary: 154 mg/dL — ABNORMAL HIGH (ref 70–99)

## 2020-11-27 LAB — BASIC METABOLIC PANEL
Anion gap: 12 (ref 5–15)
BUN: 23 mg/dL (ref 8–23)
CO2: 23 mmol/L (ref 22–32)
Calcium: 8.4 mg/dL — ABNORMAL LOW (ref 8.9–10.3)
Chloride: 108 mmol/L (ref 98–111)
Creatinine, Ser: 1.75 mg/dL — ABNORMAL HIGH (ref 0.61–1.24)
GFR, Estimated: 42 mL/min — ABNORMAL LOW (ref >60)
Glucose, Bld: 143 mg/dL — ABNORMAL HIGH (ref 70–99)
Potassium: 4.2 mmol/L (ref 3.5–5.1)
Sodium: 143 mmol/L (ref 135–145)

## 2020-11-27 LAB — TYPE AND SCREEN
ABO/RH(D): O POS
Antibody Screen: NEGATIVE
Unit division: 0

## 2020-11-27 LAB — BPAM RBC
Blood Product Expiration Date: 202201092359
ISSUE DATE / TIME: 202201020808
Unit Type and Rh: 5100

## 2020-11-27 IMAGING — CR DG FOOT COMPLETE 3+V*R*
3 series · 3 of 3 positions shown · non-contrast
Comparison: 04/16/2020

CLINICAL DATA: Foot pain

EXAM:
RIGHT FOOT COMPLETE - 3+ VIEW

[foot ap]
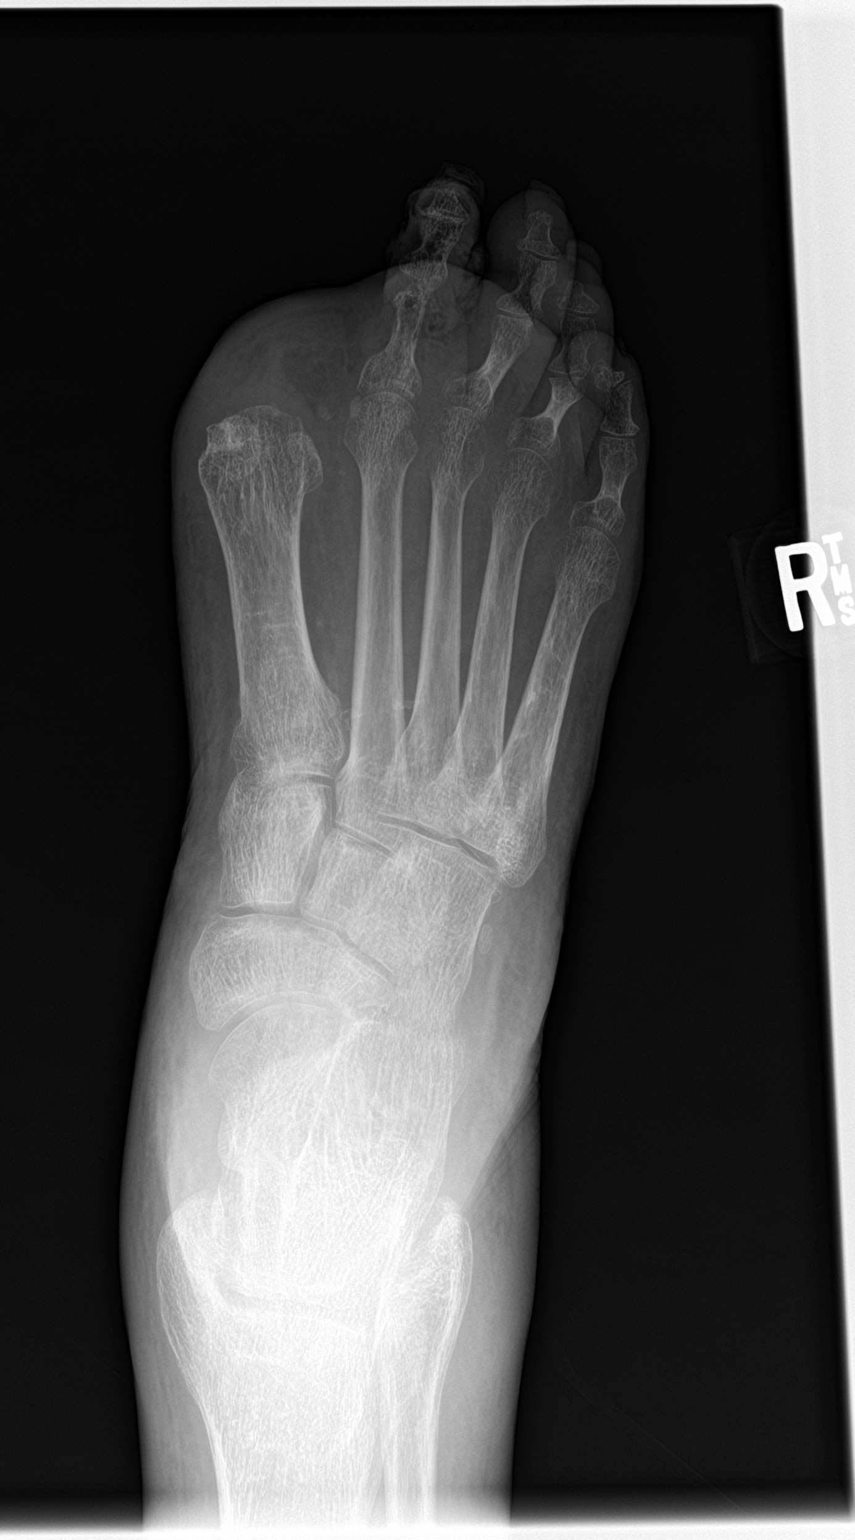

[foot obl]
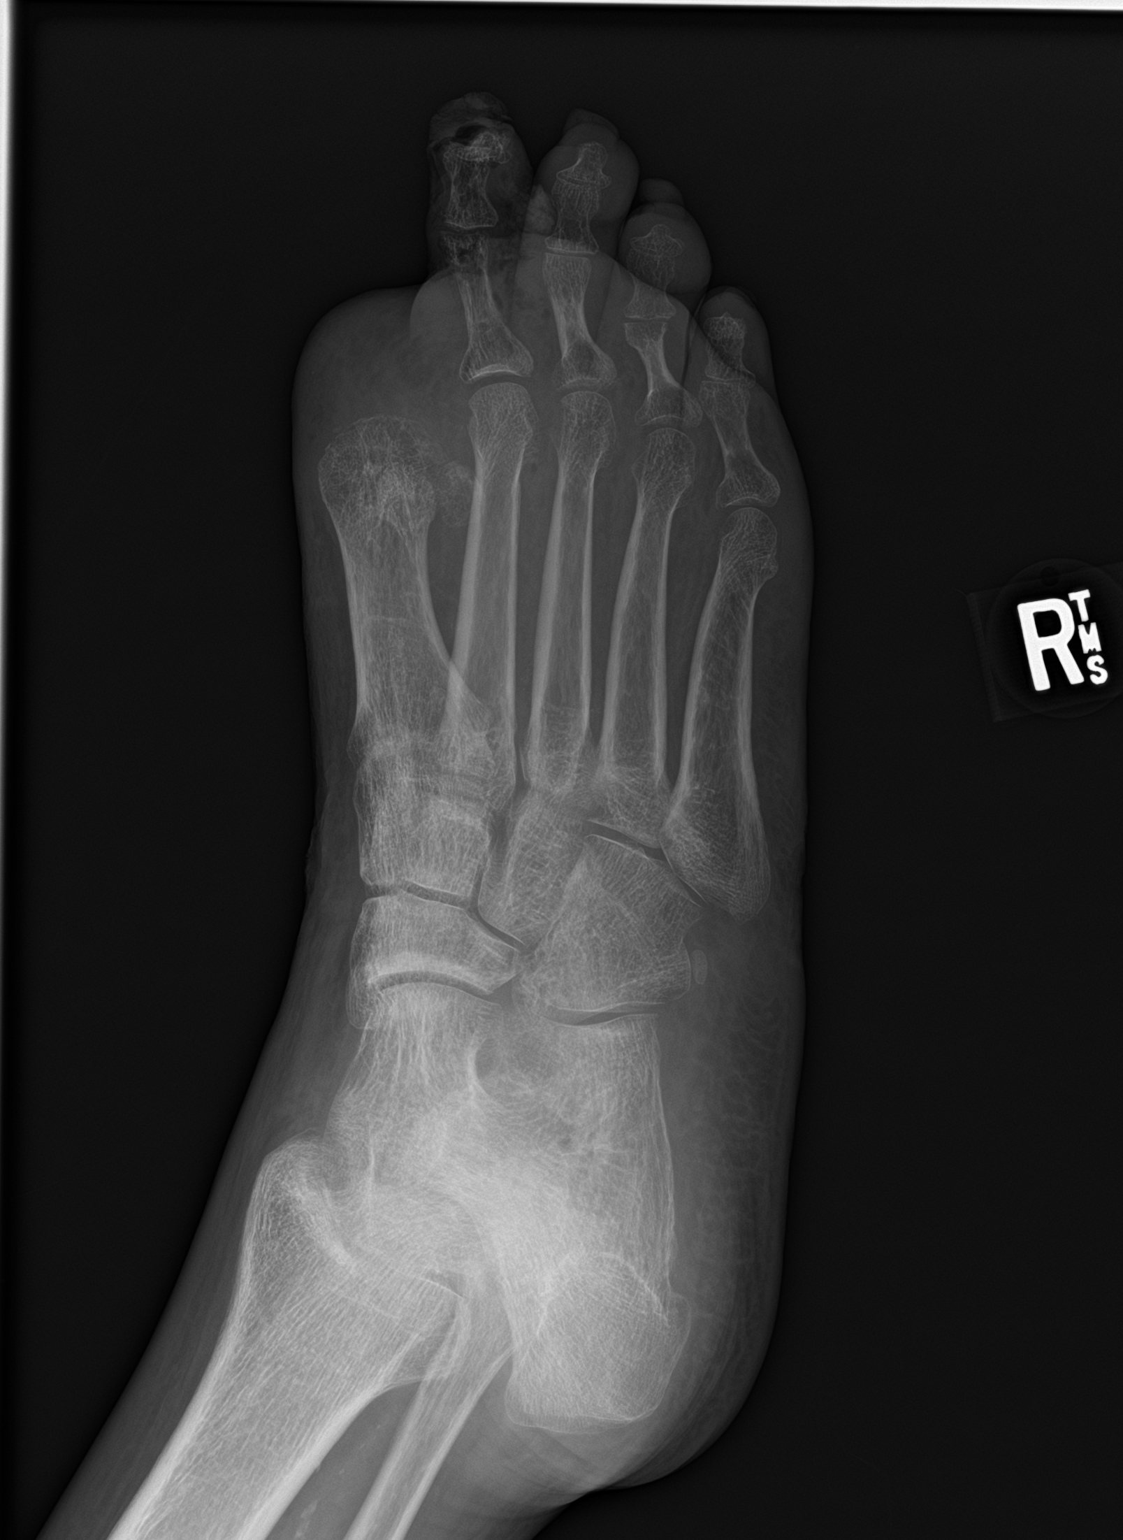

[foot lat]
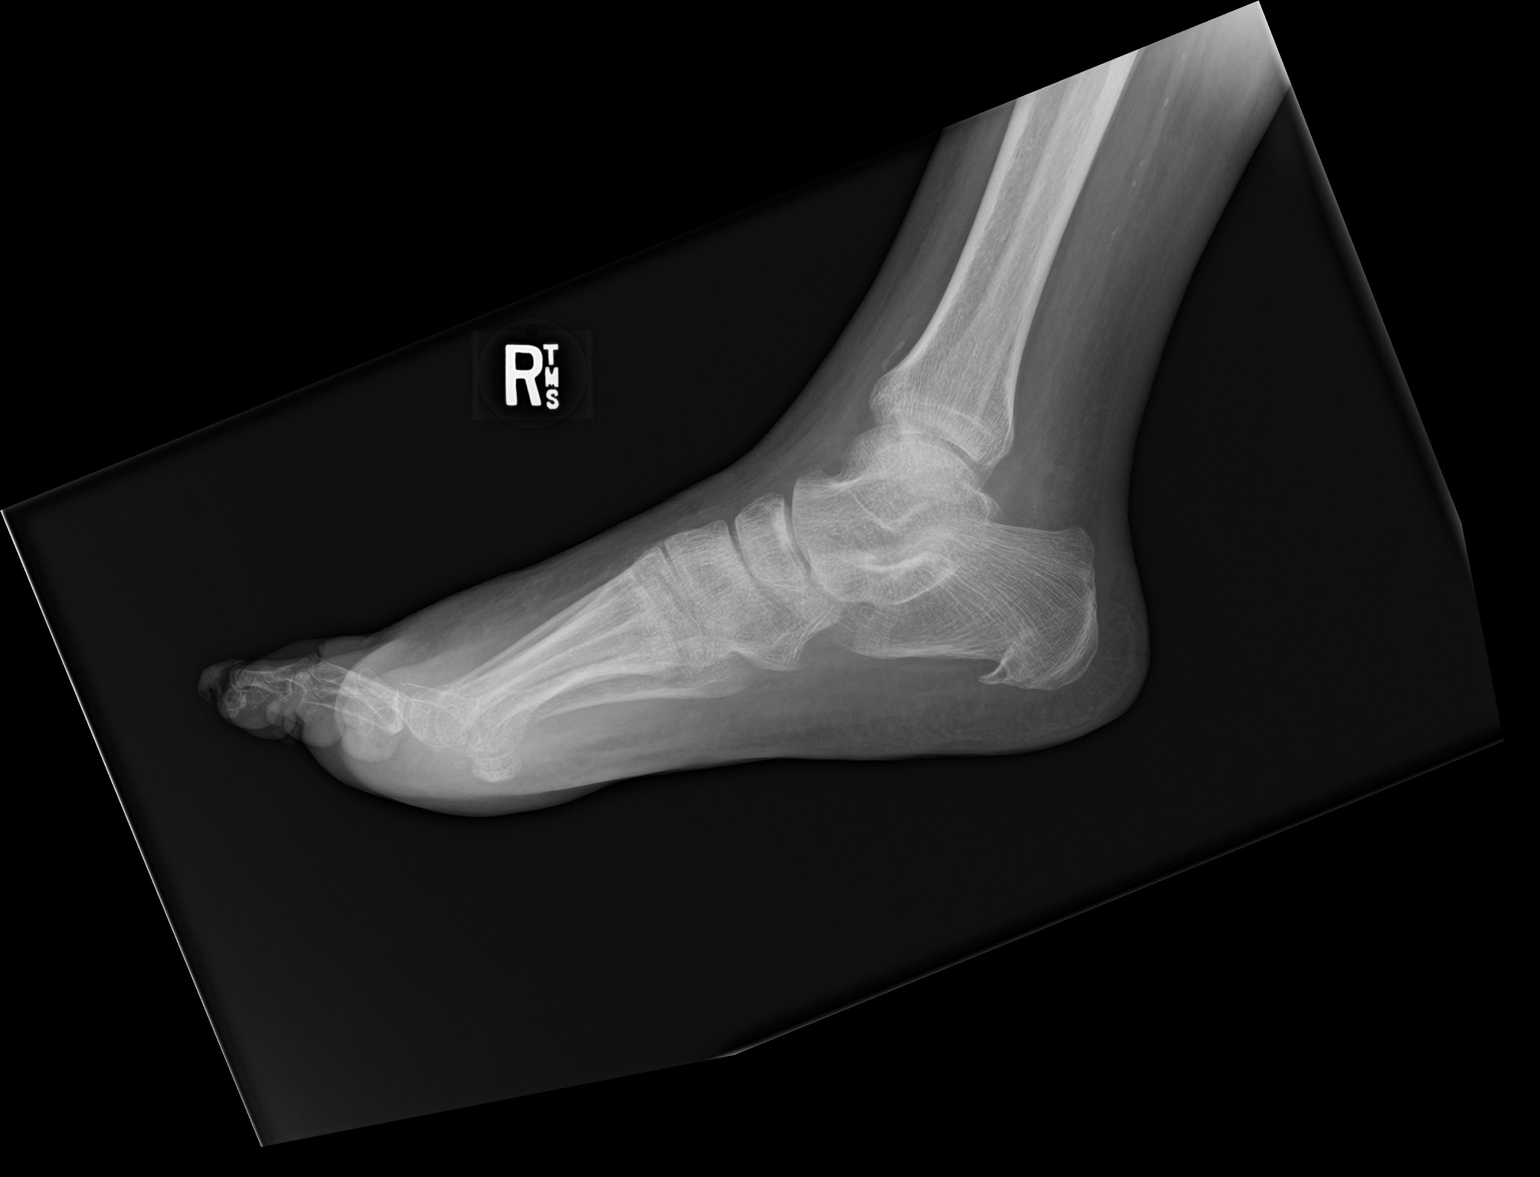

[3 of 3 positions shown; findings below may reference images not displayed]

FINDINGS: Postsurgical changes are noted with amputation of the first digit.
Soft tissue swelling and subcutaneous air is noted in the second
digit consistent with localized infection. There has been interval
bony destruction at the distal aspect of the second proximal
phalanx. Persistent resorption at the phalangeal tuft of the second
digit is noted. These changes are consistent with osteomyelitis.
IMPRESSION: Progressive soft tissue swelling, subcutaneous air and bony
destruction in the second digit as described. These changes are
consistent with cellulitis and underlying osteomyelitis.

## 2020-11-27 MED ORDER — FUROSEMIDE 10 MG/ML IJ SOLN
20.0000 mg | Freq: Three times a day (TID) | INTRAMUSCULAR | Status: DC
  Administered 2020-11-27 – 2020-11-29 (×5): 20 mg via INTRAVENOUS
  Filled 2020-11-27 (×6): qty 2

## 2020-11-27 MED ORDER — POTASSIUM CHLORIDE CRYS ER 20 MEQ PO TBCR: 20.0000 meq | EXTENDED_RELEASE_TABLET | Freq: Every day | ORAL | Status: DC

## 2020-11-27 MED ORDER — FREE WATER
150.0000 mL | Status: DC
  Administered 2020-11-27 – 2020-11-30 (×16): 150 mL

## 2020-11-27 MED ORDER — FUROSEMIDE 10 MG/ML IJ SOLN
20.0000 mg | Freq: Three times a day (TID) | INTRAMUSCULAR | Status: DC
  Administered 2020-11-27 (×2): 20 mg via INTRAVENOUS
  Filled 2020-11-27: qty 2

## 2020-11-27 MED ORDER — FUROSEMIDE 10 MG/ML IJ SOLN: 20.0000 mg | Freq: Three times a day (TID) | INTRAMUSCULAR | Status: DC

## 2020-11-27 MED ORDER — PROSOURCE TF PO LIQD
45.0000 mL | Freq: Three times a day (TID) | ORAL | Status: DC
  Administered 2020-11-27 – 2020-11-30 (×9): 45 mL
  Filled 2020-11-27 (×9): qty 45

## 2020-11-27 MED ORDER — FREE WATER: 100.0000 mL | Freq: Four times a day (QID) | Status: DC

## 2020-11-27 MED ORDER — ALBUTEROL SULFATE (2.5 MG/3ML) 0.083% IN NEBU
INHALATION_SOLUTION | RESPIRATORY_TRACT | Status: AC
  Administered 2020-11-27: 2.5 mg
  Filled 2020-11-27: qty 3

## 2020-11-27 MED ORDER — OSMOLITE 1.5 CAL PO LIQD
1000.0000 mL | ORAL | Status: DC
  Administered 2020-11-27 – 2020-11-30 (×5): 1000 mL
  Filled 2020-11-27 (×5): qty 1000

## 2020-11-27 MED ORDER — POTASSIUM CHLORIDE 20 MEQ PO PACK
20.0000 meq | PACK | Freq: Two times a day (BID) | ORAL | Status: DC
  Administered 2020-11-27 – 2020-11-30 (×6): 20 meq
  Filled 2020-11-27 (×6): qty 1

## 2020-11-27 MED ORDER — ALBUTEROL SULFATE (2.5 MG/3ML) 0.083% IN NEBU: 2.5000 mg | INHALATION_SOLUTION | Freq: Once | RESPIRATORY_TRACT | Status: DC | PRN

## 2020-11-27 NOTE — Progress Notes (Signed)
Mitts were applied due to pt. Taking oxygen off more than once.

## 2020-11-27 NOTE — Procedures (Signed)
Cortrak  Person Inserting Tube:  Carlin Mamone, RD Tube Type:  Cortrak - 43 inches Tube Location:  Left nare Initial Placement:  Stomach Secured by: Bridle Technique Used to Measure Tube Placement:  Documented cm marking at nare/ corner of mouth Cortrak Secured At:  70 cm    No x-ray is required. RN may begin using tube.   If the tube becomes dislodged please keep the tube and contact the Cortrak team at [email or web address] ([email or web address]) for replacement.  If after hours and replacement cannot be delayed, place a NG tube and confirm placement with an abdominal x-ray.    Sammi Stolarz RD, LDN Clinical Nutrition Pager listed in AMION    

## 2020-11-27 NOTE — Plan of Care (Signed)
  Problem: Respiratory: Goal: Respiratory symptoms related to disease process will be avoided Outcome: Progressing   Problem: Skin Integrity: Goal: Risk for impaired skin integrity will decrease Outcome: Progressing
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
## 2020-11-27 NOTE — Plan of Care (Signed)
  Problem: Activity: Goal: Activity intolerance will improve Outcome: Progressing   Problem: Respiratory: Goal: Respiratory symptoms related to disease process will be avoided Outcome: Progressing   Problem: Activity: Goal: Capacity to carry out activities will improve Outcome: Progressing

## 2020-11-27 NOTE — Progress Notes (Signed)
PROGRESS NOTE  Ricky Hanson SJG:[id number] DOB: 1951/07/04 DOA: 11/08/2020 PCP: Hoy Register, MD  HPI/Recap of past 24 hours: Ricky Hanson is a 70 y.o. male with PMH significant for DM2, HTN, HLD, schizophrenia.  Patient was initially admitted to the hospital on 12/15 for AKI, obstructive uropathy.  On 12/22, patient developed V. fib and cardiac arrest, was pulseless for about 20 minutes, successfully resuscitated, intubated and transferred to ICU.  12/24, MRI brain showed a small subacute to chronic white matter infarct in the subcortical right frontal lobe with no acute insult.  Transferred out of ICU to hospitalist service on 11/20/20.  Hospital course complicated by SBO diagnosed on 11/23/2020.  General surgery was consulted.  Patient had a left inguinal hernia that was reduced on 11/23/2020.  The next day chest x-ray showed improvement in SBO.  Also showed incidental finding of small apical right pneumothorax.  Was seen by PCCM who recommended to monitor as there is no indication for chest tube placement at this time.  On 11/25/2020 he was started on clear liquid diet however appears to have worsening dysphagia the following day while attempting to swallow his pills with pure.  Speech therapist was reconsulted on 11/26/2020.  Acute blood loss anemia with hemoglobin down to 7.2.  Gastric fluid positive for blood.  Due to concern for upper GI bleed he was started on IV PPI twice daily.  GI was consulted.  1 unit PRBC ordered for transfusion.  11/27/20: Seen and examined at his bedside.  Increased work of breathing noted on exam.  He is somnolent but easily arousable to voices.  He denies chest pain or dyspnea however he is not a reliable historian.  Reviewed chest x-ray which shows bilateral pleural effusions and pulmonary edema worse on the right.  Ordered VBG and started gentle IV diuresing IV Lasix 20 mg 3 times daily x3 doses.  Patient has been n.p.o. due to severe dysphagia with high risk for  aspiration.  Cortrack has been ordered for nutritional assistance.  Assessment/Plan: Principal Problem:   ARF (acute renal failure) (HCC) Active Problems:   Chronic indwelling Foley catheter   Peripheral vascular disease (HCC)   CKD (chronic kidney disease), stage III (HCC)   Essential hypertension   AKI (acute kidney injury) (HCC)   Cardiac arrest (HCC)  Acute hypoxic respiratory failure due to resolved aspiration pneumonia, status post VT/VF cardiac arrest - ~35mins downtime,  bilateral pleural effusions right greater than left, and pulmonary edema. Not on oxygen supplementation at baseline Currently on 2 L with O2 saturation 99%. -Completed 5-day course of IV Zosyn on 12/26 for aspiration pneumonia. Continue incentive spirometer and flutter valve.  Worsening dysphagia in the setting of recent stroke Seen by speech therapy, failed swallow evaluation multiple times. Had cortract in the ICU, came out. Aspiration precautions in place Cortrack has been ordered on 11/27/2020 Currently n.p.o. due to severe risk for aspiration.  Bilateral pleural effusions, pulmonary edema right greater than left, with concern for volume overload, worsening atelectasis. Elevated BNP greater than 1000. He had received IV fluid in the setting of SBO. Incentive spirometer and flutter valve were ordered IV Lasix 20 mg 3 times daily x3 doses started on 11/27/2020. Personally reviewed chest x-ray done on 11/27/2020 which shows bilateral pleural effusions and pulmonary edema right greater than left.  Acute hypoxic respiratory failure secondary to bilateral pleural effusions, pulmonary edema. Not on oxygen supplementation at baseline Currently requiring 2 L to maintain O2 saturation greater than 90% IV Lasix as  stated above. Continue to closely monitor  Acute on chronic diastolic CHF Last 2D echo with normal LVEF Start strict I's and O's and daily weight IV Lasix Continue Coreg 3.125 mg twice daily.  Acute  blood loss anemia, possibly upper GI bleed Drop in hemoglobin down to 7.0 1 unit PRBC ordered to be transfused, consent obtained from his son via phone on 11/26/2020.  Resolved SBO on abdominal x-ray and CT scan General surgery consulted and following. Was started on clear liquid diet on 11/24/2020.due to concern for worsening dysphagia, patient was made n.p.o. on 11/26/2020 and speech therapist was consulted.   Due to worsening bilateral pleural effusions right greater than left, rate of IV fluid was decreased to 30 cc/h on 11/26/2020 and stopped on 11/27/2020.   Repeated abdominal x-ray done on 11/24/2020 shows improvement of SBO, abdominal x-ray done on 11/25/2020 showed the same.  Incidental findings, Small right apical pneumothorax seen on chest x-ray done on 11/24/2020 Mild right basilar hydropneumothorax. Seen by PCCM, recommended conservative management, no indication for tube placement at this time.  Left inguinal hernia, reduced by general surgery on 11/23/2020 Will likely need to follow-up with general surgery outpatient.  Acute metabolic encephalopathy, improving -Intermittent delirium, delirium precautions. At the time of this exam the patient is alert and calm.  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 -Prior to admission, patient was on benztropine 1 mg nightly and long-acting Haldol subcutaneous monthly. -Continue to monitor mental status. -Neurology recommended continuation of Keppra 500 mg twice daily. If patient has clinical seizure, recommendation is to increase it to 1000 mg daily. Patient is to follow-up with neurology as an outpatient in 8 to 10 weeks of discharge. If remains seizure-free, can consider repeat EEG and weaning off Keppra outpatient. Closely monitor on remote telemetry  Right frontal subacute to acute infarct -12/24, MRI brain showed a small subacute to chronic white matter infarct in  the subcortical right frontal lobe with no acute insult. -MRA head and bilateral carotid duplex unremarkable. -Discussed with neurology. -TTE showed normal ejection fraction without cardiac source of embolism. Aspirin was held due to gastric fluid positive for blood and acute drop in hemoglobin.  Resume statin.  Unfortunately unable to tolerate p.o. at this time. Palliative care team consulted to assist with establishing goals of care  Concern for upper GI bleed in the setting of acute on chronic anemia Acute drop in hemoglobin from 10-8 to 7.3 this morning. Gastric fluid positive for blood Continue IV PPI twice daily Monitor and transfuse with hemoglobin less than 7.0. GI consulted, appreciate recommendations  Acute kidney injury on CKD stage IIIa suspect prerenal in the setting of dehydration from poor intake. Baseline creatinine appears to be 1.3 with GFR of 56. Creatinine up rising 2.0 from 1.55. Continue to avoid nephrotoxins, dehydration and hypotension. Daily renal panel.  Resolved hyperglycemia -A1c 5.7 on 11/08/20.   -Not on diabetes medications at home. -Currently on insulin sliding scale. Avoid hypoglycemia  Resolved intermittent sinus tachycardia, suspect driven by lung physiology TSH 0.597 Continue to monitor vital signs  Essential hypertension BP is currently at goal. Continue lower dose of Coreg 3.125 mg twice daily to avoid beta-blockade withdrawal. Continue to monitor vital signs  Physical debility/ambulatory dysfunction PT assessment recommended SNF Continue PT OT with assistance and fall precautions.  Goals of care Poor prognosis in the setting of recent PEA/cardiac arrest, recent stroke, severe dysphagia, moderate protein calorie malnutrition, recent SBO, acute on chronic diastolic CHF,  bilateral pleural effusions/pulmonary edema, acute hypoxic respiratory failure, poor functional status. Palliative care team has been consulted to assist with establishing  goals of care. Patient is currently a full code.  Mobility: PT evaluation recommended SNF. Code Status:  Code Status: Full Code  Nutritional status: Body mass index is 25.22 kg/m. Nutrition Problem: Inadequate oral intake Etiology: dysphagia Signs/Symptoms: NPO status    Diet Order                  Diet NPO time specified  Diet effective now                  DVT prophylaxis: SCDs   Antimicrobials: Completed the course of Zosyn on 12/26  Consultants: Neurology, general surgery, GI, palliative care team, speech therapy. Family Communication:  Updated his son via phone on 11/26/2020.  Received consent for blood transfusion.    Remains inpatient appropriate because:Altered mental status    Status is: Inpatient    Dispo:  Patient From: Home  Planned Disposition: Skilled Nursing Facility  Expected discharge date: 11/29/2020  Medically stable for discharge: No, management of acute on chronic diastolic CHF.        Objective: Vitals:   11/26/20 1341 11/26/20 1622 11/26/20 2105 11/27/20 0353  BP: 115/68 123/80 122/79 133/82  Pulse: 87 86 81 88  Resp: 19 18 15    Temp: 98.9 F (37.2 C)  (!) 97.5 F (36.4 C) 98 F (36.7 C)  TempSrc:   Oral Axillary  SpO2: 93% 95% 100% 95%  Weight:      Height:        Intake/Output Summary (Last 24 hours) at 11/27/2020 1036 Last data filed at 11/26/2020 2357 Gross per 24 hour  Intake 2003.81 ml  Output 900 ml  Net 1103.81 ml   Filed Weights   11/23/20 0427 11/24/20 0500 11/26/20 0527  Weight: 82.1 kg 79.8 kg 84.3 kg    Exam:  . General: 70 y.o. year-old male frail-appearing, chronically ill appearing, somnolent but easily arousable to voices. . Cardiovascular: Regular rate and rhythm no rubs or gallops. 79 Respiratory: Diffuse wheezing bilaterally.  Rales noted at bases.  Worse on the right.  Poor inspiratory effort. . Abdomen: Soft nontender bowel sounds present. . Musculoskeletal: Right below the knee  amputation.  Trace lower extremity edema in left lower extremity. Marland Kitchen Psychiatry: Unable to assess mood due to somnolence.   Data Reviewed: CBC: Recent Labs  Lab 11/22/20 0031 11/23/20 0230 11/24/20 0226 11/24/20 0845 11/24/20 1432 11/24/20 2221 11/25/20 0548 11/25/20 1831 11/26/20 0413  WBC 14.5* 18.9* 10.7*  --   --   --  8.6  --  9.0  HGB 9.4* 10.6* 8.0*   < > 8.6* 7.7* 7.4* 7.3* 7.0*  HCT 28.9* 34.1* 26.1*   < > 26.8* 23.9* 24.2* 24.1* 23.1*  MCV 85.5 84.6 85.9  --   --   --  86.1  --  88.8  PLT 272 PLATELET CLUMPS NOTED ON SMEAR, UNABLE TO ESTIMATE 325  --   --   --  295  --  277   < > = values in this interval not displayed.   Basic Metabolic Panel: Recent Labs  Lab 11/21/20 0850 11/22/20 0031 11/23/20 0647 11/24/20 0226 11/25/20 0548 11/26/20 0413  NA 145 148* 148* 153* 148* 149*  K 4.1 4.8 4.1 4.4 4.2 4.2  CL 115* 118* 115* 115* 112* 114*  CO2 17* 22 22 29 24 23   GLUCOSE 257* 169* 157* 160* 182*  126*  BUN 27* 35* 30* 25* 19 23  CREATININE 1.69* 1.66* 1.39* 1.42* 1.55* 2.01*  CALCIUM 8.4* 8.5* 9.1 8.5* 8.2* 8.2*  MG 2.1  --  2.3  --   --   --   PHOS 3.1  --   --   --   --   --    GFR: Estimated Creatinine Clearance: 40.3 mL/min (A) (by C-G formula based on SCr of 2.01 mg/dL (H)). Liver Function Tests: Recent Labs  Lab 11/21/20 0850  AST 91*  ALT 38  ALKPHOS 108  BILITOT 0.6  PROT 6.1*  ALBUMIN 2.2*   No results for input(s): LIPASE, AMYLASE in the last 168 hours. No results for input(s): AMMONIA in the last 168 hours. Coagulation Profile: No results for input(s): INR, PROTIME in the last 168 hours. Cardiac Enzymes: No results for input(s): CKTOTAL, CKMB, CKMBINDEX, TROPONINI in the last 168 hours. BNP (last 3 results) No results for input(s): PROBNP in the last 8760 hours. HbA1C: No results for input(s): HGBA1C in the last 72 hours. CBG: Recent Labs  Lab 11/26/20 1610 11/26/20 1925 11/26/20 2344 11/27/20 0332 11/27/20 0816  GLUCAP 121*  146* 143* 154* 113*   Lipid Profile: No results for input(s): CHOL, HDL, LDLCALC, TRIG, CHOLHDL, LDLDIRECT in the last 72 hours. Thyroid Function Tests: No results for input(s): TSH, T4TOTAL, FREET4, T3FREE, THYROIDAB in the last 72 hours. Anemia Panel: No results for input(s): VITAMINB12, FOLATE, FERRITIN, TIBC, IRON, RETICCTPCT in the last 72 hours. Urine analysis:    Component Value Date/Time   COLORURINE AMBER (A) 11/08/2020 1800   APPEARANCEUR TURBID (A) 11/08/2020 1800   LABSPEC 1.010 11/08/2020 1800   PHURINE 6.0 11/08/2020 1800   GLUCOSEU NEGATIVE 11/08/2020 1800   HGBUR MODERATE (A) 11/08/2020 1800   BILIRUBINUR NEGATIVE 11/08/2020 1800   KETONESUR NEGATIVE 11/08/2020 1800   PROTEINUR 30 (A) 11/08/2020 1800   NITRITE NEGATIVE 11/08/2020 1800   LEUKOCYTESUR LARGE (A) 11/08/2020 1800   Sepsis Labs: @LABRCNTIP (procalcitonin:4,lacticidven:4)  ) No results found for this or any previous visit (from the past 240 hour(s)).    Studies: DG CHEST PORT 1 VIEW  Result Date: 11/27/2020 CLINICAL DATA:  Pneumothorax. EXAM: PORTABLE CHEST 1 VIEW COMPARISON:  11/24/2020 FINDINGS: 0609 hours. Right base collapse/consolidation is similar to prior with small right pleural effusion. Stable appearance of the retrocardiac collapse/consolidative opacity. Cardiopericardial silhouette is at upper limits of normal for size. Telemetry leads overlie the chest. IMPRESSION: No substantial interval change in exam. Bibasilar collapse/consolidation with small right pleural effusion. Tiny right apical pneumothorax seen previously not evident on the current exam. Electronically Signed   By: 11/26/2020 M.D.   On: 11/27/2020 07:13    Scheduled Meds: . (feeding supplement) PROSource Plus  30 mL Oral BID BM  . sodium chloride   Intravenous Once  . sodium chloride   Intravenous Once  . atorvastatin  40 mg Per NG tube Daily  . benztropine  1 mg Per NG tube QHS  . carvedilol  3.125 mg Per NG tube BID WC   . chlorhexidine  15 mL Mouth Rinse BID  . Chlorhexidine Gluconate Cloth  6 each Topical Daily  . feeding supplement  1 Container Oral TID BM  . furosemide  20 mg Intravenous TID  . insulin aspart  0-9 Units Subcutaneous Q4H  . mouth rinse  15 mL Mouth Rinse q12n4p  . mirtazapine  15 mg Per NG tube QHS  . multivitamin with minerals  1 tablet Oral Daily  .  pantoprazole (PROTONIX) IV  40 mg Intravenous Q12H  . sodium chloride flush  10-40 mL Intracatheter Q12H  . sodium chloride flush  10-40 mL Intracatheter Q12H    Continuous Infusions: . sodium chloride 250 mL (11/25/20 0643)  . sodium chloride 10 mL/hr at 11/18/20 1900  . dextrose 5 % and 0.45% NaCl 30 mL/hr at 11/26/20 1246  . levETIRAcetam 500 mg (11/27/20 0330)     LOS: 18 days     Kayleen Memos, MD [facility] Pager [phone]  If 7PM-7AM, please contact night-coverage [email or web address] Password [facility] 11/27/2020, 10:36 AM

## 2020-11-27 NOTE — Progress Notes (Signed)
Nutrition Follow-up  RD working remotely.  DOCUMENTATION CODES:   Not applicable  INTERVENTION:  - plan for Cortrak placement today. - will order Osmolite 1.5 @ 20 ml/hr to advance by 10 ml every 8 hours to reach goal rate of 60 ml/hr with 45 ml Prosource TF TID and 150 ml free water every 4 hours.  - at goal rate, this regimen will provide 2280 kcal, 123 grams protein, and 1997 ml free water.  - will d/c Boost Breeze and Prosource Plus--can re-order when diet is able to be re-advanced.  NUTRITION DIAGNOSIS:   Inadequate oral intake related to dysphagia,inability to eat as evidenced by NPO status.   GOAL:   Patient will meet greater than or equal to 90% of their needs -to be met with TF  MONITOR:   Diet advancement,TF tolerance,Labs,Weight trends  REASON FOR ASSESSMENT:   Consult Enteral/tube feeding initiation and management  ASSESSMENT:   70 yo male admitted with obstructed uropathy, acute renal insufficiency, UTI. Intubated and transferred to the ICU early 12/22 s/p V fib cardiac arrest. PMH includes DM, HLD, HTN, schizophrenia, [name].  Significant Events: 12/25 extubated 12/27 Cortrak placed (gastric tip) 12/29 pt passed BSE with recommendations for dysphagia 2 diet w/ thin liquids; KUB ordered due to pt pulling at Cortrak and KUB revealed SBO 12/30 pt found to have inguinal hernia which was reduced by CCM; Cortrak removed and replaced by NGT to LIS 12/31 pt pulled NGT overnight; attempts at replacing were unsuccessful; diet advanced to clear liquids   RD is working on a different campus; assessment being conducted remotely. Patient is noted to be a/o to self only so did not attempt to call patient.   Diet changed from Dysphagia 2, thin liquid to NPO on 12/29 at 2120. Diet advanced to CLD on 12/31 at 76 and then changed back to NPO yesterday at 1035.   Weight has been fluctuating nearly daily since admission on 12/15. Mild pitting edema to LLE documented in the  flow sheet.   Last RD follow-up was on 1/1 at which time it was documented that patient with SBO. Surgery note this AM states that SBO and incarcerated L inguinal hernia is now reduced and okay for diet advancement per SLP recommendation. Surgery signing off as of today.  Flow sheet documentation indicates patient had 2 BMs today.   He was last seen by SLP yesterday evening with recommendation made for NPO with Dysphagia 1, thin liquids from floor stock.   Palliative Care saw patient yesterday. Plan to continue Full Code at this time.    Labs reviewed; CBGs: 154, 113, 129 mg/dl, creatinine: 1.75 mg/dl, Ca: 8.4 mg/dl, GFR Medications reviewed; sliding scale novolog, 1 tablet multivitamin with minerals/day, 40 mg IV protonix BID,    Diet Order:   Diet Order            Diet NPO time specified  Diet effective now                 EDUCATION NEEDS:   Not appropriate for education at this time  Skin:  Skin Assessment: Reviewed RN Assessment  Last BM:  1/3 (type 4 x1; type 6 x1)  Height:   Ht Readings from Last 1 Encounters:  11/09/20 $RemoveB'6\' 1"'nQWxmOPD$  (1.854 m)    Weight:   Wt Readings from Last 1 Encounters:  11/26/20 84.3 kg     Estimated Nutritional Needs:  Kcal:  2200-2400 Protein:  120-135 gm Fluid:  >/= 2 L  Ricky Sabree, MS, RD, LDN, CNSC Inpatient Clinical Dietitian RD pager # available in AMION  After hours/weekend pager # available in AMION  

## 2020-11-27 NOTE — Progress Notes (Signed)
RN unable to draw back from midline. Lab contacted, due to unanswered calls from phelobotomy, for possible transfer to phelm. Tech . No one answered after several calls to multiple numbers. RN will reattempt in one hour.

## 2020-11-27 NOTE — Progress Notes (Addendum)
@   1050 Pt. Was not very responsive, lethargic, noticed labored breathing and use of accessory muscles to breathe. BP was normal, Oxygen was normal. Called in [place] RN as second opinion and made decision to call Rapid at 1105. Dr. Dow Adolph was paged twice and made aware of situation.

## 2020-11-27 NOTE — Significant Event (Signed)
Rapid Response Event Note   Reason for Call :  Lethargy, cool to touch  Initial Focused Assessment:  Pt lying in bed. Responds to voice, eye contact non-sustained. Mild increased work of breathing, wheezing heard in the bases. PERRLA, 7mm. Skin is cool, dry, pink. Pulses are 1+ in the peripherals. Pt is moving all extremities, no focal deficits noted.   VS: T 98.57F rectal, 137/92, HR 89, RR 15, SpO2 100% on 2LNC CBG: 129  Interventions:  -CBG 129 -Albuterol neb given by RT -ABG 7.393/ 39.4/ 76.2/ 23.5 -CXR completed at 0600  Plan of Care:  -Continue to monitor: level of care updated to PCU -Wean oxygen as pt tolerates -Aspiration precautions  Call rapid response for additional needs  Event Summary:  MD Notified: Dr. Margo Aye Call Time: 1103 Arrival Time: 1105 End Time: 1140  Jennye Moccasin, RN

## 2020-11-28 DIAGNOSIS — N179 Acute kidney failure, unspecified: Secondary | ICD-10-CM | POA: Diagnosis not present

## 2020-11-28 DIAGNOSIS — I469 Cardiac arrest, cause unspecified: Secondary | ICD-10-CM | POA: Diagnosis not present

## 2020-11-28 DIAGNOSIS — J948 Other specified pleural conditions: Secondary | ICD-10-CM

## 2020-11-28 DIAGNOSIS — I10 Essential (primary) hypertension: Secondary | ICD-10-CM | POA: Diagnosis not present

## 2020-11-28 DIAGNOSIS — K56609 Unspecified intestinal obstruction, unspecified as to partial versus complete obstruction: Secondary | ICD-10-CM

## 2020-11-28 DIAGNOSIS — I739 Peripheral vascular disease, unspecified: Secondary | ICD-10-CM

## 2020-11-28 DIAGNOSIS — N1832 Chronic kidney disease, stage 3b: Secondary | ICD-10-CM | POA: Diagnosis not present

## 2020-11-28 DIAGNOSIS — Z978 Presence of other specified devices: Secondary | ICD-10-CM | POA: Diagnosis not present

## 2020-11-28 LAB — CBC
HCT: 30.1 % — ABNORMAL LOW (ref 39.0–52.0)
Hemoglobin: 9.5 g/dL — ABNORMAL LOW (ref 13.0–17.0)
MCH: 26.7 pg (ref 26.0–34.0)
MCHC: 31.6 g/dL (ref 30.0–36.0)
MCV: 84.6 fL (ref 80.0–100.0)
Platelets: 330 10*3/uL (ref 150–400)
RBC: 3.56 MIL/uL — ABNORMAL LOW (ref 4.22–5.81)
RDW: 17.5 % — ABNORMAL HIGH (ref 11.5–15.5)
WBC: 10.5 10*3/uL (ref 4.0–10.5)
nRBC: 0.6 % — ABNORMAL HIGH (ref 0.0–0.2)

## 2020-11-28 LAB — GLUCOSE, CAPILLARY
Glucose-Capillary: 135 mg/dL — ABNORMAL HIGH (ref 70–99)
Glucose-Capillary: 148 mg/dL — ABNORMAL HIGH (ref 70–99)
Glucose-Capillary: 165 mg/dL — ABNORMAL HIGH (ref 70–99)
Glucose-Capillary: 190 mg/dL — ABNORMAL HIGH (ref 70–99)
Glucose-Capillary: 143 mg/dL — ABNORMAL HIGH (ref 70–99)
Glucose-Capillary: 171 mg/dL — ABNORMAL HIGH (ref 70–99)

## 2020-11-28 LAB — BASIC METABOLIC PANEL
Anion gap: 9 (ref 5–15)
BUN: 24 mg/dL — ABNORMAL HIGH (ref 8–23)
CO2: 25 mmol/L (ref 22–32)
Calcium: 8.2 mg/dL — ABNORMAL LOW (ref 8.9–10.3)
Chloride: 109 mmol/L (ref 98–111)
Creatinine, Ser: 1.65 mg/dL — ABNORMAL HIGH (ref 0.61–1.24)
GFR, Estimated: 45 mL/min — ABNORMAL LOW (ref >60)
Glucose, Bld: 152 mg/dL — ABNORMAL HIGH (ref 70–99)
Potassium: 3.9 mmol/L (ref 3.5–5.1)
Sodium: 143 mmol/L (ref 135–145)

## 2020-11-28 MED ORDER — PANTOPRAZOLE SODIUM 40 MG PO PACK
40.0000 mg | PACK | Freq: Two times a day (BID) | ORAL | Status: DC
  Administered 2020-11-28 – 2020-11-30 (×5): 40 mg
  Filled 2020-11-28 (×5): qty 20

## 2020-11-28 MED ORDER — PANTOPRAZOLE SODIUM 40 MG PO PACK: 40.0000 mg | PACK | Freq: Every day | ORAL | Status: DC

## 2020-11-28 NOTE — Progress Notes (Signed)
Physical Therapy Treatment Patient Details Name: Ricky Hanson MRN: [id number] DOB: 11/01/1951 Today's Date: 11/28/2020    History of Present Illness Pt is a 70 y.o. M admitted with AKI stage III with mild hyperkalemia from obstruction due to malplaced Foley catheter. 06-Dec-2023 pt coded and intubated. Extubated 12/25.  PMHx of schizophrenia, HTN, CKD, [name].    PT Comments    Pt very lethargic this AM. Able to minimally participate in LE exercises but required continuous cues between each rep. Bed transitioned to chair position but pt still did not rouse. When asked if he wanted to sit on the side of the bed, he shook his head no.  Erratic resting HR noted, 78-121. SpO2 92% on 2L. HOB at 42 degrees at end of session.   Follow Up Recommendations  SNF     Equipment Recommendations  None recommended by PT    Recommendations for Other Services       Precautions / Restrictions Precautions Precautions: Fall;Other (comment) Precaution Comments: cortrak    Mobility  Bed Mobility Overal bed mobility: Needs Assistance             General bed mobility comments: Bed egress for chair position. Max assist to reposition in bed due to lethargy.  Transfers                    Ambulation/Gait                 Stairs             Wheelchair Mobility    Modified Rankin (Stroke Patients Only)       Balance                                            Cognition Arousal/Alertness: Lethargic Behavior During Therapy: Flat affect Overall Cognitive Status: Difficult to assess Area of Impairment: Following commands;Attention                       Following Commands: Follows one step commands inconsistently;Follows one step commands with increased time              Exercises General Exercises - Lower Extremity Heel Slides: AAROM;Left;10 reps;Supine Amputee Exercises Hip ABduction/ADduction: AROM;AAROM;Right;Left;10  reps;Supine Knee Flexion: AROM;Right;10 reps;Seated Knee Extension: AROM;Right;10 reps Straight Leg Raises: Right;AAROM;10 reps;Supine    General Comments General comments (skin integrity, edema, etc.): SpO2 92% on 2L. Erratic HR at rest, 78-121. RN reports pt in heart block this AM and just recently went back to sinus rhythmn. BP 106/59 with bed in chair position.      Pertinent Vitals/Pain Pain Assessment: Faces Faces Pain Scale: No hurt    Home Living                      Prior Function            PT Goals (current goals can now be found in the care plan section) Acute Rehab PT Goals Patient Stated Goal: none stated Progress towards PT goals: Not progressing toward goals - comment (lethargy affecting ability to participate)    Frequency    Min 2X/week      PT Plan Current plan remains appropriate    Co-evaluation              AM-PAC PT "6 Clicks" Mobility  Outcome Measure  Help needed turning from your back to your side while in a flat bed without using bedrails?: A Lot Help needed moving from lying on your back to sitting on the side of a flat bed without using bedrails?: A Lot Help needed moving to and from a bed to a chair (including a wheelchair)?: A Lot Help needed standing up from a chair using your arms (e.g., wheelchair or bedside chair)?: A Lot Help needed to walk in hospital room?: A Lot Help needed climbing 3-5 steps with a railing? : Total 6 Click Score: 11    End of Session   Activity Tolerance: Patient limited by lethargy Patient left: in bed;with call bell/phone within reach;with bed alarm set Nurse Communication: Mobility status PT Visit Diagnosis: Difficulty in walking, not elsewhere classified (R26.2)     Time: 0459-9774 PT Time Calculation (min) (ACUTE ONLY): 14 min  Charges:  $Therapeutic Exercise: 8-22 mins                     Aida Raider, PT  Office # [phone] Pager [phone]    Ilda Foil 11/28/2020,  9:28 AM

## 2020-11-28 NOTE — Progress Notes (Signed)
PROGRESS NOTE    Ricky Hanson  VZD:[id number] DOB: 1951/07/02 DOA: 11/08/2020 PCP: Charlott Rakes, MD    Brief Narrative:  Ricky Hanson is a 70 year old male with past medical history significant for type 2 diabetes mellitus, benign essential hypertension, hyperlipidemia, schizophrenia.  Patient was initially admitted to the hospital on 12/15 for AKI, obstructive uropathy.  On 12/22, patient developed V. fib and cardiac arrest, was pulseless for about 20 minutes, successfully resuscitated, intubated and transferred to ICU.  12/24, MRI brain showed a small subacute to chronic white matter infarct in the subcortical right frontal lobe with no acute insult.  Transferred out of ICU to hospitalist service on 11/20/20.  Hospital course complicated by SBO diagnosed on 11/23/2020.  General surgery was consulted.  Patient had a left inguinal hernia that was reduced on 11/23/2020.  The next day chest x-ray showed improvement in SBO.  Also showed incidental finding of small apical right pneumothorax.  Was seen by PCCM who recommended to monitor as there is no indication for chest tube placement at this time.  On 11/25/2020 he was started on clear liquid diet however appears to have worsening dysphagia the following day while attempting to swallow his pills with pure.  Speech therapist was reconsulted on 11/26/2020.  Acute blood loss anemia with hemoglobin down to 7.2.  Gastric fluid positive for blood.  Due to concern for upper GI bleed he was started on IV PPI twice daily.    Assessment & Plan:   Principal Problem:   ARF (acute renal failure) (HCC) Active Problems:   Chronic indwelling Foley catheter   Peripheral vascular disease (HCC)   CKD (chronic kidney disease), stage III (HCC)   Essential hypertension   AKI (acute kidney injury) ([place])   Cardiac arrest ([place])   Acute hypoxic respiratory failure, POA Aspiration pneumonia: resolved status post VT/VF cardiac arrest - ~40mins  downtime Bilateral pleural effusions right greater than left, and pulmonary edema. --Completed 7 day course of cefepime on 12/21 and 5 day course of Zosyn on 12/26 --titrate supplemental oxygen to maintain SPO2 > 92%, on 2L [place] with SPO2 99%; not on O2 at baseline --Lasix 20mg  IV TID --Continue incentive spirometer and flutter valve.  Worsening dysphagia in the setting of recent stroke Seen by speech therapy, failed swallow evaluation multiple times. --Core track replaced on 11/27/2020 --Aspiration precautions in place --Currently n.p.o. due to severe risk for aspiration; may need PEG tube placement; also with speech therapy today  Bilateral pleural effusions, pulmonary edema right greater than left, with concern for volume overload, worsening atelectasis. Elevated BNP greater than 1000. He had received IV fluid in the setting of SBO. Chest x-ray done on 11/27/2020 which shows bilateral pleural effusions and pulmonary edema right greater than left. --Furosemide 20 mg IV 3 times daily --Continue supplemental oxygen as above  Acute on chronic diastolic CHF Last 2D echo with normal LVEF. --strict I's and O's and daily weight --IV Lasix as above --Continue Coreg 3.125 mg twice daily.  Acute blood loss anemia, possibly upper GI bleed Drop in hemoglobin down to 7.0 on 11/26/2020. Transfused 1u pRBC on 11/26/20. --Hgb 7.0>9.5>9.5; stable --protonix 40mg  per tube BID --Continue to monitor CBC daily  Resolved SBO on abdominal x-ray and CT scan He was consulted and followed during hospital course.  Left inguinal hernia was reduced by general surgery on 11/23/2020.  Was started on clear liquid diet on 11/24/2020.due to concern for worsening dysphagia, patient was made n.p.o. on 11/26/2020 and speech therapist was consulted.  Due to worsening bilateral pleural effusions right greater than left, rate of IV fluid was decreased to 30 cc/h on 11/26/2020 and stopped on 11/27/2020. Repeated abdominal x-ray done  on 11/24/2020 shows improvement of SBO, abdominal x-ray done on 11/25/2020 showed the same. --Continue tube feeds, titrate to goal --1 bowel movement reported yesterday --monitor strict I's and O's  Incidental findings, Small right apical pneumothorax seen on chest x-ray done on 11/24/2020 Mild right basilar hydropneumothorax. Seen by PCCM, recommended conservative management, no indication for tube placement at this time.  Left inguinal hernia, reduced by general surgery on 11/23/2020 Will likely need to follow-up with general surgery outpatient.  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 --Neurology recommends continuing Keppra 500 mg BID; if patient has seizure can increase to 1000mg  BID --Continue delirium precautions --Follow-up neurology 8-10 weeks following discharge, if remains seizure-free can consider repeat EEG and weaning off Keppra outpatient.  Right frontal subacute to acute infarct MRI brain 12/24 showed a small subacute to chronic white matter infarct in the subcortical right frontal lobe with no acute insult. MRA head and bilateral carotid duplex unremarkable. TTE showed normal ejection fraction without cardiac source of embolism. --Discussed with neurology. --Aspirin held due to gastric fluid positive for blood and acute drop in hemoglobin.  --Atorvastatin 4 mg per tube daily --Palliative care team following to assist with establishing goals of care  Concern for upper GI bleed in the setting of acute on chronic anemia Hemoglobin decreased to 7.0 on 11/26/20, gastric fluid positive for blood.  Status post 1 unit PRBC.  Seen by gastroenterology, Dr. 01/24/21 on 12/31; with recommendations of holding heparin/aspirin, holding off on EGD and PPI twice daily. --Protonix 40mg  per tube BID --Monitor and transfuse with hemoglobin less  than 7.0.  Acute kidney injury on CKD stage IIIa suspect prerenal in the setting of dehydration from poor intake. Baseline creatinine appears to be 1.3 with GFR of 56.  CT renal stone study on admission with markedly distended urinary bladder extending to the umbilicus with bilateral hydroutero nephrosis consistent with urinary retention, did note Foley catheter in place with balloon appearing to be in the urethra and not draining the bladder. Creatinine 3.03>>1.39>>2.01>1.75>1.65 --foley catheter remains in place --Continue to avoid nephrotoxins, dehydration and hypotension. --Strict I's and O's --Daily renal panel.  Resolved hyperglycemia Hemoglobin A1c 5.7 on 11/08/20.  --Insulin sliding scale  Resolved intermittent sinus tachycardia, suspect driven by lung physiology TSH 0.597 Continue to monitor vital signs  Essential hypertension BP is currently at goal, BP 1 3473 this morning --Continue lower dose of Coreg 3.125 mg BID to avoid beta-blockade withdrawal. --Continue to monitor vital signs  Physical debility/ambulatory dysfunction PT assessment recommended SNF --Continue PT OT with assistance and fall precautions. --TOC for placement  Goals of care Poor prognosis in the setting of recent PEA/cardiac arrest, recent stroke, severe dysphagia, moderate protein calorie malnutrition, recent SBO, acute on chronic diastolic CHF, bilateral pleural effusions/pulmonary edema, acute hypoxic respiratory failure, poor functional status. Palliative care team has been consulted to assist with establishing goals of care. Patient is currently a full code.   DVT prophylaxis: SCDs Code Status: Full code Family Communication: Attempted to update patient's son, via telephone; unfortunately was blocked by "[employer]iller"  Disposition Plan:  Status is: Inpatient  Remains inpatient appropriate because:Unsafe d/c plan, IV treatments appropriate due to intensity of illness or inability to  take PO and Inpatient  level of care appropriate due to severity of illness   Dispo:  Patient From: Home  Planned Disposition: Skilled Nursing Facility  Expected discharge date: 11/29/2020  Medically stable for discharge: No   Consultants:   PCCM: signed off 12/28  [facility]: signed off 12/31  Neurology: signed off 12/28  General Surgery - signed off 11/27/2020  Palliative Care  Procedures:   Central Line  Arterial line  Intubation 12/22  Antimicrobials:   Cefepime 12/15 - 12/21  Zosyn 11/22 - 11/26  Ceftriaxone 12/15 - 12/15    Subjective: Patient seen and examined bedside, resting comfortably.  No specific complaints this morning.  Alert but not oriented.  Unable to obtain any other ROS from patient due to his confusion.  No acute concerns per nursing staff overnight.  Objective: Vitals:   11/27/20 1714 11/27/20 2235 11/28/20 0755 11/28/20 0848  BP: 138/81 134/73 106/62 (!) 105/55  Pulse: 88 89  87  Resp:  16    Temp:  98.2 F (36.8 C)    TempSrc:  Axillary    SpO2:  99%    Weight:      Height:        Intake/Output Summary (Last 24 hours) at 11/28/2020 0952 Last data filed at 11/28/2020 0700 Gross per 24 hour  Intake 10 ml  Output 2000 ml  Net -1990 ml   Filed Weights   11/23/20 0427 11/24/20 0500 11/26/20 0527  Weight: 82.1 kg 79.8 kg 84.3 kg    Examination:  General exam: Appears calm and comfortable; frail/chronically ill in appearance Respiratory system: Decreased breath sounds bilateral bases, otherwise clear to auscultation, normal respiratory effort on 2 L nasal cannula with SPO2 99%. Cardiovascular system: S1 & S2 heard, RRR. No JVD, murmurs, rubs, gallops or clicks. No pedal edema. Gastrointestinal system: Abdomen is nondistended, soft and nontender. No organomegaly or masses felt. Normal bowel sounds heard. GU: foley noted with clear yellow urine. Central nervous system: Alert. No focal neurological deficits. Extremities: noted right  BKA Skin: No rashes, lesions or ulcers Psychiatry: Judgement and insight appear poor. Depressed mood & flat affect.     Data Reviewed: I have personally reviewed following labs and imaging studies  CBC: Recent Labs  Lab 11/24/20 0226 11/24/20 0845 11/25/20 0548 11/25/20 1831 11/26/20 0413 11/27/20 1021 11/28/20 0351  WBC 10.7*  --  8.6  --  9.0 11.5* 10.5  HGB 8.0*   < > 7.4* 7.3* 7.0* 9.5* 9.5*  HCT 26.1*   < > 24.2* 24.1* 23.1* 31.1* 30.1*  MCV 85.9  --  86.1  --  88.8 86.9 84.6  PLT 325  --  295  --  277 303 330   < > = values in this interval not displayed.   Basic Metabolic Panel: Recent Labs  Lab 11/23/20 0647 11/24/20 0226 11/25/20 0548 11/26/20 0413 11/27/20 1021 11/28/20 0351  NA 148* 153* 148* 149* 143 143  K 4.1 4.4 4.2 4.2 4.2 3.9  CL 115* 115* 112* 114* 108 109  CO2 22 29 24 23 23 25   GLUCOSE 157* 160* 182* 126* 143* 152*  BUN 30* 25* 19 23 23  24*  CREATININE 1.39* 1.42* 1.55* 2.01* 1.75* 1.65*  CALCIUM 9.1 8.5* 8.2* 8.2* 8.4* 8.2*  MG 2.3  --   --   --   --   --    GFR: Estimated Creatinine Clearance: 49.1 mL/min (A) (by C-G formula based on SCr of 1.65 mg/dL (H)). Liver Function Tests: No results for input(s):  AST, ALT, ALKPHOS, BILITOT, PROT, ALBUMIN in the last 168 hours. No results for input(s): LIPASE, AMYLASE in the last 168 hours. No results for input(s): AMMONIA in the last 168 hours. Coagulation Profile: No results for input(s): INR, PROTIME in the last 168 hours. Cardiac Enzymes: No results for input(s): CKTOTAL, CKMB, CKMBINDEX, TROPONINI in the last 168 hours. BNP (last 3 results) No results for input(s): PROBNP in the last 8760 hours. HbA1C: No results for input(s): HGBA1C in the last 72 hours. CBG: Recent Labs  Lab 11/27/20 1643 11/27/20 1944 11/27/20 2333 11/28/20 0443 11/28/20 0750  GLUCAP 122* 117* 133* 135* 148*   Lipid Profile: No results for input(s): CHOL, HDL, LDLCALC, TRIG, CHOLHDL, LDLDIRECT in the last 72  hours. Thyroid Function Tests: No results for input(s): TSH, T4TOTAL, FREET4, T3FREE, THYROIDAB in the last 72 hours. Anemia Panel: No results for input(s): VITAMINB12, FOLATE, FERRITIN, TIBC, IRON, RETICCTPCT in the last 72 hours. Sepsis Labs: Recent Labs  Lab 11/23/20 0647  PROCALCITON 0.11  LATICACIDVEN 1.0    No results found for this or any previous visit (from the past 240 hour(s)).       Radiology Studies: DG CHEST PORT 1 VIEW  Result Date: 11/27/2020 CLINICAL DATA:  Pneumothorax. EXAM: PORTABLE CHEST 1 VIEW COMPARISON:  11/24/2020 FINDINGS: 0609 hours. Right base collapse/consolidation is similar to prior with small right pleural effusion. Stable appearance of the retrocardiac collapse/consolidative opacity. Cardiopericardial silhouette is at upper limits of normal for size. Telemetry leads overlie the chest. IMPRESSION: No substantial interval change in exam. Bibasilar collapse/consolidation with small right pleural effusion. Tiny right apical pneumothorax seen previously not evident on the current exam. Electronically Signed   By: Kennith Center M.D.   On: 11/27/2020 07:13        Scheduled Meds: . sodium chloride   Intravenous Once  . sodium chloride   Intravenous Once  . atorvastatin  40 mg Per NG tube Daily  . benztropine  1 mg Per NG tube QHS  . carvedilol  3.125 mg Per NG tube BID WC  . chlorhexidine  15 mL Mouth Rinse BID  . Chlorhexidine Gluconate Cloth  6 each Topical Daily  . feeding supplement (PROSource TF)  45 mL Per Tube TID  . free water  150 mL Per Tube Q4H  . furosemide  20 mg Intravenous TID  . insulin aspart  0-9 Units Subcutaneous Q4H  . mouth rinse  15 mL Mouth Rinse q12n4p  . mirtazapine  15 mg Per NG tube QHS  . multivitamin with minerals  1 tablet Oral Daily  . pantoprazole (PROTONIX) IV  40 mg Intravenous Q12H  . potassium chloride  20 mEq Per Tube BID  . sodium chloride flush  10-40 mL Intracatheter Q12H  . sodium chloride flush  10-40 mL  Intracatheter Q12H   Continuous Infusions: . sodium chloride 250 mL (11/25/20 0643)  . sodium chloride 10 mL/hr at 11/18/20 1900  . feeding supplement (OSMOLITE 1.5 CAL) 1,000 mL (11/28/20 0501)  . levETIRAcetam 500 mg (11/28/20 0444)     LOS: 19 days    Time spent: 42 minutes spent on chart review, discussion with nursing staff, consultants, updating family and interview/physical exam; more than 50% of that time was spent in counseling and/or coordination of care.    Alvira Philips [place], DO [facility] Available via Epic secure chat 7am-7pm After these hours, please refer to coverage provider listed on [email or web address] 11/28/2020, 9:52 AM

## 2020-11-28 NOTE — Progress Notes (Signed)
Daily Progress Note   Patient Name: Ricky Hanson       Date: 11/28/2020 DOB: 04-28-1951  Age: 70 y.o. MRN#: [id number] Attending Physician: [place], Eric J, DO Primary Care Physician: Ricky Register, MD Admit Date: 11/08/2020  Reason for Consultation/Follow-up: Establishing goals of care  Patient continues to show minimal signs of improvement. Required rapid response and transfer yesterday. Severe dysphagia and protein calorie malnutrition. Patient able to arouse with some cueing during attempts today by SLP, however with delayed transit and oral holding.   I attempted to contact patient's son, Ricky Hanson however he was unavailable. Voicemail left. I also reached out to him on yesterday however he was not able to speak at that time either. Son has given permission to speak with patient's cousin Ricky Hanson and relay information however he is not the Management consultant.   I was able to speak with Ricky Hanson at length and provide updates. Ricky Hanson verbalized understanding expressing he would not want Ricky Hanson to suffer and feels that he may be. Support provided. Ricky Hanson shares patient's health challenges over the months-recent year. He includes the difficulty in patient's son be readily available due to his Government job and residing in [place]. I verbalized understanding also encouraging Ricky Hanson to share with patient's son when they spoke the importance of supporting his father and decision making needs. I offerred to write him a leave of absence not due to an immediate need for support. Ricky Hanson shares he would share with Ricky Hanson.   I discussed at length patient's current condition, prognosis, and provided education on best case and worst case scenarios. I encouraged Ricky Hanson as he and patient's son continued in discussions to focus on Ricky Hanson comfort and what is most important to him and keeping his qualify of life at the center of all decisions.   [name] verbalized understanding and appreciation. He shared  to continue providing updates and tearfully expressed "I am looking to hear from you all at anytime that he has passed away!" Emotional support provided. I also created space and opportunity to emphasize to family that patient is currently a full code and heroic measures would be performed although that would not be in patient's best interest given severity of health. Ricky Hanson agreed expressing he wish he could provide decisions but again knows that Ricky Hanson is the Management consultant.   All questions answered and support provided.    Length of Stay: 19 days  Vital Signs: BP (!) 105/55   Pulse 87   Temp 98.2 F (36.8 C) (Axillary)   Resp 16   Ht 6\' 1"  (1.854 m)   Wt 84.3 kg   SpO2 99%   BMI 24.52 kg/m  SpO2: SpO2: 99 % O2 Device: O2 Device: Nasal Cannula O2 Flow Rate: O2 Flow Rate (L/min): 2 L/min  The above conversation was completed via telephone due to the visitor restrictions during the COVID-19 pandemic. Thorough chart review and discussion with necessary members of the care team was completed as part of assessment. All issues were discussed and addressed but no physical exam was performed.  Palliative Care Assessment & Plan  HPI: Per intake H&P --> Ricky Hanson a 70 y.o.malewith PMH significant for DM2, HTN, HLD, schizophrenia. Patient was initially admitted to the hospital on 12/15 for AKI, obstructive uropathy. On 12/22, patient developed V. fib and cardiac arrest, was pulseless for about 20 minutes, successfully resuscitated, intubated andtransferred to ICU. 12/24, MRI brain showed a small subacute to chronic white matter infarct in the subcortical right frontal  lobe with no acute insult. Transferred out of ICU to hospitalist serviceon 11/20/20. Found to have an SBO on 12/30 and apical pneumothorax both for which conservative measures were taken.   Code Status:  Full code  Goals of Care/Recommendations:  Full Code/Full Scope  Son has been difficult to reach and/or  scheduled detailed GOC discussions with. He apparently works for [employer] in [place]. Approval given to provide updates to [facility] but he is not able to make decisions.   Provided extensive updates and education to [place] emphasizing the importance and need for further family discussions for medical decisions making on behalf of patient.   PMT will continue to support and follow. Ricky Hanson is aware I will be off service tomorrow (Wed) returning on [place].   Symptom Management:  Per attending   Prognosis: Guarded-Poor   Discharge Planning: To Be Determined  Thank you for allowing the Palliative Medicine Team to assist in the care of this patient.  Time Total: 50 min.   Visit consisted of counseling and education dealing with the complex and emotionally intense issues of symptom management and palliative care in the setting of serious and potentially life-threatening illness.Greater than 50%  of this time was spent counseling and coordinating care related to the above assessment and plan.  Ricky Hanson, AGPCNP-BC  Palliative Medicine Team [phone]

## 2020-11-28 NOTE — Progress Notes (Signed)
Telemetry contacted RN about rhythm change, [place] 2. RN updated oncoming day shift nurse of rhythm change, with suggestion of EKG.

## 2020-11-28 NOTE — Progress Notes (Signed)
Son called for update on Ricky Hanson. RN updated [name] on pallative attempting to reach out to him for further decision making regarding fathers care/prognosis. [name] agreed that someone named [name] left a voicemail and he would return the call. RN gave Producer, television/film/video for [employer]). He expressed that he thinks its best that his father goes to hospice. He believes his father will require more care than, his cousin, [name] can give.

## 2020-11-28 NOTE — Progress Notes (Signed)
  Speech Language Pathology Treatment: Dysphagia  Patient Details Name: Ricky Hanson MRN: [id number] DOB: December 02, 1950 Today's Date: 11/28/2020 Time: 5462-7035 SLP Time Calculation (min) (ACUTE ONLY): 24 min  Assessment / Plan / Recommendation Clinical Impression  SLP followed up for PO readiness. Pt able to arouse with cueing, responsive to communication with SLP. Completed oral care as some dried secretions noted orally. Pt consumed thin liquids and puree boluses with delayed in AP transit as well as intermittent oral holding. Verbal and tactile cues assisted swallow sequencing. Per palpation swallow initiation was delayed. Vocal quality remains hoarse, though no wetness observed. No overt s/sx of aspiration. Recommend dysphagia 1 (puree) and thin liquids with full supervision with all POs. Hold POs if mentation is poor. SLP to closely monitor.     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 He was admitted on 12/15 secondary to confusion and found to have obstructive uropathy and acute renal insufficiency. He developed ventricular fibrillation and cardiac arrest on 12/22. EEG 12/22: severe diffuse encephalopathy; no seizures. 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      SLP Plan  Continue with current plan of care       Recommendations  Diet recommendations: Dysphagia 1 (puree);Thin liquid Liquids provided via: Cup Medication Administration: Crushed with puree Supervision: Staff to assist with self feeding;Full supervision/cueing for compensatory strategies Compensations: Slow rate;Small sips/bites;Follow solids with liquid Postural Changes and/or Swallow Maneuvers: Seated upright 90 degrees;Upright 30-60 min after meal                 Oral Care Recommendations: Oral care BID Follow up Recommendations: Skilled Nursing facility;24 hour supervision/assistance SLP Visit Diagnosis: Dysphagia, unspecified (R13.10) Plan: Continue with current plan of care       GO                Natoria Archibald E Bralyn Espino MA, CCC-SLP Acute Rehabilitation Services  11/28/2020, 11:01 AM

## 2020-11-28 NOTE — Plan of Care (Signed)
  Problem: Nutritional: Goal: Ability to make appropriate dietary choices will improve Outcome: Progressing   Problem: Activity: Goal: Capacity to carry out activities will improve Outcome: Progressing   Problem: Clinical Measurements: Goal: Respiratory complications will improve Outcome: Progressing   Problem: Nutrition: Goal: Adequate nutrition will be maintained Outcome: Progressing

## 2020-11-29 ENCOUNTER — Inpatient Hospital Stay (HOSPITAL_COMMUNITY): Payer: Medicare Other

## 2020-11-29 DIAGNOSIS — N179 Acute kidney failure, unspecified: Secondary | ICD-10-CM | POA: Diagnosis not present

## 2020-11-29 LAB — CBC
HCT: 35.8 % — ABNORMAL LOW (ref 39.0–52.0)
Hemoglobin: 10.9 g/dL — ABNORMAL LOW (ref 13.0–17.0)
MCH: 26.7 pg (ref 26.0–34.0)
MCHC: 30.4 g/dL (ref 30.0–36.0)
MCV: 87.5 fL (ref 80.0–100.0)
Platelets: 333 10*3/uL (ref 150–400)
RBC: 4.09 MIL/uL — ABNORMAL LOW (ref 4.22–5.81)
RDW: 18.2 % — ABNORMAL HIGH (ref 11.5–15.5)
WBC: 12.6 10*3/uL — ABNORMAL HIGH (ref 4.0–10.5)
nRBC: 0.2 % (ref 0.0–0.2)

## 2020-11-29 LAB — GLUCOSE, CAPILLARY
Glucose-Capillary: 145 mg/dL — ABNORMAL HIGH (ref 70–99)
Glucose-Capillary: 156 mg/dL — ABNORMAL HIGH (ref 70–99)
Glucose-Capillary: 170 mg/dL — ABNORMAL HIGH (ref 70–99)
Glucose-Capillary: 186 mg/dL — ABNORMAL HIGH (ref 70–99)
Glucose-Capillary: 188 mg/dL — ABNORMAL HIGH (ref 70–99)

## 2020-11-29 LAB — BASIC METABOLIC PANEL
Anion gap: 10 (ref 5–15)
BUN: 25 mg/dL — ABNORMAL HIGH (ref 8–23)
CO2: 25 mmol/L (ref 22–32)
Calcium: 8 mg/dL — ABNORMAL LOW (ref 8.9–10.3)
Chloride: 106 mmol/L (ref 98–111)
Creatinine, Ser: 1.63 mg/dL — ABNORMAL HIGH (ref 0.61–1.24)
GFR, Estimated: 46 mL/min — ABNORMAL LOW (ref >60)
Glucose, Bld: 157 mg/dL — ABNORMAL HIGH (ref 70–99)
Potassium: 4.9 mmol/L (ref 3.5–5.1)
Sodium: 141 mmol/L (ref 135–145)

## 2020-11-29 MED ORDER — FUROSEMIDE 40 MG PO TABS
40.0000 mg | ORAL_TABLET | Freq: Every day | ORAL | Status: DC
  Administered 2020-11-30: 40 mg
  Filled 2020-11-29: qty 1

## 2020-11-29 NOTE — Progress Notes (Addendum)
PROGRESS NOTE    Ricky Hanson  BWG:[id number] DOB: 1951-08-13 DOA: 11/08/2020 PCP: Hoy Register, MD    Brief Narrative:  Ricky Hanson is a 70 year old male with past medical history significant for type 2 diabetes mellitus, benign essential hypertension, hyperlipidemia, schizophrenia.  Patient was initially admitted to the hospital on 12/15 for AKI, obstructive uropathy.  On 12/22, patient developed V. fib and cardiac arrest, was pulseless for about 20 minutes, successfully resuscitated, intubated and transferred to ICU.  12/24, MRI brain showed a small subacute to chronic white matter infarct in the subcortical right frontal lobe with no acute insult.  Transferred out of ICU to hospitalist service on 11/20/20.  Hospital course complicated by SBO diagnosed on 11/23/2020.  General surgery was consulted.  Patient had a left inguinal hernia that was reduced on 11/23/2020.  The next day chest x-ray showed improvement in SBO.  Also showed incidental finding of small apical right pneumothorax.  Was seen by PCCM who recommended to monitor as there is no indication for chest tube placement at this time.  On 11/25/2020 he was started on clear liquid diet however appears to have worsening dysphagia the following day while attempting to swallow his pills with pure.  Speech therapist was reconsulted on 11/26/2020.  Acute blood loss anemia with hemoglobin down to 7.2.  Gastric fluid positive for blood.  Due to concern for upper GI bleed he was started on IV PPI twice daily.    Assessment & Plan:   Principal Problem:   ARF (acute renal failure) (HCC) Active Problems:   Chronic indwelling Foley catheter   Peripheral vascular disease (HCC)   CKD (chronic kidney disease), stage III (HCC)   Essential hypertension   AKI (acute kidney injury) (HCC)   Cardiac arrest (HCC)   Acute hypoxic respiratory failure, POA Aspiration pneumonia: resolved status post VT/VF cardiac arrest - ~44mins  downtime Bilateral pleural effusions right greater than left, and pulmonary edema. --Completed 7 day course of cefepime on 12/21 and 5 day course of Zosyn on 12/26 --titrate supplemental oxygen to maintain SPO2 > 92%, on 2L [place] with SPO2 99%; not on O2 at baseline --Continue incentive spirometer and flutter valve.  Worsening dysphagia in the setting of recent stroke Seen by speech therapy, failed swallow evaluation multiple times. --Core track replaced on 11/27/2020 --Aspiration precautions in place --Cleared by speech therapy to start dysphagia 1 diet with thin liquids on 11/28/2020, concern that he may not have adequate oral intake --Nutrition consult for calorie count over the next 24 hours to assess need for PEG tube placement; discussed with RD this morning and she believes he will require PEG tube --IR consultation for PEG/G-tube placement; son Loura Halt ok for placement  Bilateral pleural effusions, pulmonary edema right greater than left, with concern for volume overload, worsening atelectasis. Elevated BNP greater than 1000. He had received IV fluid in the setting of SBO. Chest x-ray done on 11/27/2020 which shows bilateral pleural effusions and pulmonary edema right greater than left. --Lasix 40mg  per tube daily --Strict I's and O's and daily weights --Continue supplemental oxygen as above  Acute on chronic diastolic CHF Last 2D echo with normal LVEF. --strict I's and O's and daily weight --Lasix as above --Continue Coreg 3.125 mg twice daily.  Acute blood loss anemia, possibly upper GI bleed Drop in hemoglobin down to 7.0 on 11/26/2020. Transfused 1u pRBC on 11/26/20. --Hgb 7.0>9.5>9.5>10.9; stable --protonix 40mg  per tube BID --Continue to monitor CBC daily  Resolved SBO on abdominal x-ray and  CT scan He was consulted and followed during hospital course.  Left inguinal hernia was reduced by general surgery on 11/23/2020.  Was started on clear liquid diet on 11/24/2020.due to  concern for worsening dysphagia, patient was made n.p.o. on 11/26/2020 and speech therapist was consulted.   Due to worsening bilateral pleural effusions right greater than left, rate of IV fluid was decreased to 30 cc/h on 11/26/2020 and stopped on 11/27/2020. Repeated abdominal x-ray done on 11/24/2020 shows improvement of SBO, abdominal x-ray done on 11/25/2020 showed the same. --Continue tube feeds, titrate to goal --1 bowel movement reported yesterday --monitor strict I's and O's  Incidental findings, Small right apical pneumothorax seen on chest x-ray done on 11/24/2020 Mild right basilar hydropneumothorax. Seen by PCCM, recommended conservative management, no indication for tube placement at this time.  Left inguinal hernia, reduced by general surgery on 11/23/2020 Will likely need to follow-up with general surgery outpatient.  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 --Neurology recommends continuing Keppra 500 mg BID; if patient has seizure can increase to 1000mg  BID --Continue delirium precautions --Follow-up neurology 8-10 weeks following discharge, if remains seizure-free can consider repeat EEG and weaning off Keppra outpatient.  Right frontal subacute vs acute infarct MRI brain 12/24 showed a small subacute to chronic white matter infarct in the subcortical right frontal lobe with no acute insult. MRA head and bilateral carotid duplex unremarkable. TTE showed normal ejection fraction without cardiac source of embolism. --Discussed with neurology. --Aspirin held due to gastric fluid positive for blood and acute drop in hemoglobin.  --Atorvastatin 40 mg per tube daily --Palliative care team following to assist with establishing goals of care  Concern for upper GI bleed in the setting of acute on chronic anemia Hemoglobin decreased to  7.0 on 11/26/20, gastric fluid positive for blood.  Status post 1 unit PRBC.  Seen by gastroenterology, Dr. 01/24/21 on 12/31; with recommendations of holding heparin/aspirin, holding off on EGD and PPI twice daily. --Protonix 40mg  per tube BID --Monitor and transfuse with hemoglobin less than 7.0.  Acute kidney injury on CKD stage IIIa suspect prerenal in the setting of dehydration from poor intake. Baseline creatinine appears to be 1.3 with GFR of 56.  CT renal stone study on admission with markedly distended urinary bladder extending to the umbilicus with bilateral hydroutero nephrosis consistent with urinary retention, did note Foley catheter in place with balloon appearing to be in the urethra and not draining the bladder. Creatinine 3.03>>1.39>>2.01>1.75>1.65>1.63 --foley catheter remains in place --Continue to avoid nephrotoxins, dehydration and hypotension. --Strict I's and O's --Daily renal panel.  Resolved hyperglycemia Hemoglobin A1c 5.7 on 11/08/20.  --Insulin sliding scale  Resolved intermittent sinus tachycardia, suspect driven by lung physiology TSH 0.597 Continue to monitor vital signs  Essential hypertension BP is currently at goal, BP 121/69 this morning --Continue lower dose of Coreg 3.125 mg BID to avoid beta-blockade withdrawal. --Continue to monitor vital signs  Physical debility/ambulatory dysfunction PT assessment recommended SNF --Continue PT OT with assistance and fall precautions. --TOC for placement  Goals of care Poor prognosis in the setting of recent PEA/cardiac arrest, recent stroke, severe dysphagia, moderate protein calorie malnutrition, recent SBO, acute on chronic diastolic CHF, bilateral pleural effusions/pulmonary edema, acute hypoxic respiratory failure, poor functional status. Palliative care team has been consulted to assist with establishing goals of care. Patient is currently a full code.   DVT prophylaxis: SCDs Code Status: Full  code Family Communication: Updated patient's son, [name] via telephone and ok for PEG placement; also up updated cousin Fransico Michael via telephone this morning regarding need for PEG placement; and both in agreement  Disposition Plan:  Status is: Inpatient  Remains inpatient appropriate because:Unsafe d/c plan, IV treatments appropriate due to intensity of illness or inability to take PO and Inpatient level of care appropriate due to severity of illness   Dispo:  Patient From: Home  Planned Disposition: Skilled Nursing Facility  Expected discharge date: 12/01/2020  Medically stable for discharge: No   Consultants:   PCCM: signed off 12/28  [facility]: signed off 12/31  Neurology: signed off 12/28  General Surgery - signed off 11/27/2020  Palliative Care  Procedures:   Central Line  Arterial line  Intubation 12/22  Antimicrobials:   Cefepime 12/15 - 12/21  Zosyn 11/22 - 11/26  Ceftriaxone 12/15 - 12/15    Subjective: Patient seen and examined bedside, resting comfortably.  No specific complaints this morning.  Alert but not oriented.  Unable to obtain any other ROS from patient due to his confusion.  No acute concerns per nursing staff overnight.  Started on dysphagia 1 diet yesterday by speech therapy, concerned about amount of oral intake he will actually have, consulted nutrition for calorie count.  Objective: Vitals:   11/29/20 0332 11/29/20 0757 11/29/20 0800 11/29/20 0913  BP: 121/69 129/68 114/66 131/73  Pulse: 88 84 86 87  Resp: 18 13 13    Temp: 98.1 F (36.7 C) 97.8 F (36.6 C) 98.5 F (36.9 C)   TempSrc: Axillary Axillary Oral   SpO2: 95% 100%    Weight:      Height:        Intake/Output Summary (Last 24 hours) at 11/29/2020 0951 Last data filed at 11/29/2020 0935 Gross per 24 hour  Intake 10 ml  Output 1375 ml  Net -1365 ml   Filed Weights   11/23/20 0427 11/24/20 0500 11/26/20 0527  Weight: 82.1 kg 79.8 kg 84.3 kg     Examination:  General exam: Appears calm and comfortable; frail/chronically ill in appearance Respiratory system: Decreased breath sounds bilateral bases, otherwise clear to auscultation, normal respiratory effort on 2.5 L nasal cannula with SPO2 99%. Cardiovascular system: S1 & S2 heard, RRR. No JVD, murmurs, rubs, gallops or clicks. No pedal edema. Gastrointestinal system: Abdomen is nondistended, soft and nontender. No organomegaly or masses felt. Normal bowel sounds heard. GU: foley noted with clear yellow urine. Central nervous system: Alert. No focal neurological deficits. Extremities: noted right BKA Skin: No rashes, lesions or ulcers Psychiatry: Judgement and insight appear poor. Depressed mood & flat affect.     Data Reviewed: I have personally reviewed following labs and imaging studies  CBC: Recent Labs  Lab 11/25/20 0548 11/25/20 1831 11/26/20 0413 11/27/20 1021 11/28/20 0351 11/29/20 0358  WBC 8.6  --  9.0 11.5* 10.5 12.6*  HGB 7.4* 7.3* 7.0* 9.5* 9.5* 10.9*  HCT 24.2* 24.1* 23.1* 31.1* 30.1* 35.8*  MCV 86.1  --  88.8 86.9 84.6 87.5  PLT 295  --  277 303 330 333   Basic Metabolic Panel: Recent Labs  Lab 11/23/20 0647 11/24/20 0226 11/25/20 0548 11/26/20 0413 11/27/20 1021 11/28/20 0351 11/29/20 0358  NA 148*   < > 148* 149* 143 143 141  K 4.1   < > 4.2 4.2 4.2 3.9 4.9  CL 115*   < > 112* 114* 108 109 106  CO2 22   < > 24  23 23 25 25   GLUCOSE 157*   < > 182* 126* 143* 152* 157*  BUN 30*   < > 19 23 23  24* 25*  CREATININE 1.39*   < > 1.55* 2.01* 1.75* 1.65* 1.63*  CALCIUM 9.1   < > 8.2* 8.2* 8.4* 8.2* 8.0*  MG 2.3  --   --   --   --   --   --    < > = values in this interval not displayed.   GFR: Estimated Creatinine Clearance: 49.7 mL/min (A) (by C-G formula based on SCr of 1.63 mg/dL (H)). Liver Function Tests: No results for input(s): AST, ALT, ALKPHOS, BILITOT, PROT, ALBUMIN in the last 168 hours. No results for input(s): LIPASE, AMYLASE in  the last 168 hours. No results for input(s): AMMONIA in the last 168 hours. Coagulation Profile: No results for input(s): INR, PROTIME in the last 168 hours. Cardiac Enzymes: No results for input(s): CKTOTAL, CKMB, CKMBINDEX, TROPONINI in the last 168 hours. BNP (last 3 results) No results for input(s): PROBNP in the last 8760 hours. HbA1C: No results for input(s): HGBA1C in the last 72 hours. CBG: Recent Labs  Lab 11/28/20 1623 11/28/20 1949 11/28/20 2310 11/29/20 0521 11/29/20 0726  GLUCAP 190* 143* 171* 145* 156*   Lipid Profile: No results for input(s): CHOL, HDL, LDLCALC, TRIG, CHOLHDL, LDLDIRECT in the last 72 hours. Thyroid Function Tests: No results for input(s): TSH, T4TOTAL, FREET4, T3FREE, THYROIDAB in the last 72 hours. Anemia Panel: No results for input(s): VITAMINB12, FOLATE, FERRITIN, TIBC, IRON, RETICCTPCT in the last 72 hours. Sepsis Labs: Recent Labs  Lab 11/23/20 0647  PROCALCITON 0.11  LATICACIDVEN 1.0    No results found for this or any previous visit (from the past 240 hour(s)).       Radiology Studies: No results found.      Scheduled Meds: . sodium chloride   Intravenous Once  . sodium chloride   Intravenous Once  . atorvastatin  40 mg Per NG tube Daily  . benztropine  1 mg Per NG tube QHS  . carvedilol  3.125 mg Per NG tube BID WC  . chlorhexidine  15 mL Mouth Rinse BID  . Chlorhexidine Gluconate Cloth  6 each Topical Daily  . feeding supplement (PROSource TF)  45 mL Per Tube TID  . free water  150 mL Per Tube Q4H  . furosemide  20 mg Intravenous TID  . insulin aspart  0-9 Units Subcutaneous Q4H  . mouth rinse  15 mL Mouth Rinse q12n4p  . mirtazapine  15 mg Per NG tube QHS  . multivitamin with minerals  1 tablet Oral Daily  . pantoprazole sodium  40 mg Per Tube BID   Followed by  . [START ON 12/29/2020] pantoprazole sodium  40 mg Per Tube Daily  . potassium chloride  20 mEq Per Tube BID  . sodium chloride flush  10-40 mL  Intracatheter Q12H  . sodium chloride flush  10-40 mL Intracatheter Q12H   Continuous Infusions: . sodium chloride 250 mL (11/25/20 0643)  . sodium chloride 10 mL/hr at 11/18/20 1900  . feeding supplement (OSMOLITE 1.5 CAL) 40 mL/hr at 11/29/20 0524  . levETIRAcetam 500 mg (11/29/20 0528)     LOS: 20 days    Time spent: 38 minutes spent on chart review, discussion with nursing staff, consultants, updating family and interview/physical exam; more than 50% of that time was spent in counseling and/or coordination of care.    01/27/21 01/27/21, DO  [facility] Available via Epic secure chat 7am-7pm After these hours, please refer to coverage provider listed on [email or web address] 11/29/2020, 9:51 AM

## 2020-11-29 NOTE — [place] FL2 (Signed)
[place] MEDICAID FL2 LEVEL OF CARE SCREENING TOOL     IDENTIFICATION  Patient Name: Ricky Hanson Birthdate: 1951/04/07 Sex: male Admission Date (Current Location): 11/08/2020  [facility] and [place] Number:  Producer, television/film/video and Address:  [facility] [place], [address] [place], [place] [address]      Provider Number: [id number]  Attending Physician Name and Address:  Uzbekistan, Eric J, DO  Relative Name and Phone Number:  Loura Halt [facility])   [phone] [facility])    Current Level of Care: Hospital Recommended Level of Care: Skilled Nursing Facility Prior Approval Number: [id number]  Date Approved/Denied: 11/10/20 PASRR Number: [id number]  Discharge Plan: SNF    Current Diagnoses: Patient Active Problem List   Diagnosis Date Noted  . Cardiac arrest (HCC)   . AKI (acute kidney injury) (HCC) 11/09/2020  . ARF (acute renal failure) (HCC) 11/08/2020  . Essential hypertension 11/08/2020  . Pain due to onychomycosis of toenail of left foot 06/27/2020  . Osteomyelitis (HCC) 05/16/2020  . Sepsis (HCC) 05/16/2020  . Dry gangrene (HCC) 05/16/2020  . Hypertension associated with diabetes (HCC) 05/16/2020  . Dyslipidemia 05/16/2020  . Peripheral vascular disease (HCC) 05/16/2020  . Normocytic anemia 05/16/2020  . CKD (chronic kidney disease), stage III (HCC) 05/16/2020  . Acute kidney injury (HCC) 05/16/2020  . Pain due to onychomycosis of toenails of both feet 03/31/2020  . 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 Traumatic amputation of toe (HCC) 03/31/2020  . Chronic indwelling Foley catheter 02/02/2020    Orientation RESPIRATION BLADDER Height & Weight     Self  O2 Incontinent Weight: 185 lb 13.6 oz (84.3 kg) Height:  6\' 1"  (185.4 cm)  BEHAVIORAL SYMPTOMS/MOOD NEUROLOGICAL BOWEL NUTRITION STATUS      Incontinent Diet (DYS 1.  See discharge summary)  AMBULATORY STATUS COMMUNICATION OF NEEDS Skin   Extensive Assist Verbally Normal                        Personal Care Assistance Level of Assistance  Bathing,Feeding,Dressing Bathing Assistance: Limited assistance Feeding assistance: Limited assistance Dressing Assistance: Limited assistance     Functional Limitations Info  Sight,Hearing,Speech Sight Info: Adequate Hearing Info: Adequate Speech Info: Adequate    SPECIAL CARE FACTORS FREQUENCY  PT (By licensed PT),OT (By licensed OT)     PT Frequency: 5x/week OT Frequency: 5x/week            Contractures Contractures Info: Not present    Additional Factors Info  Code Status,Allergies,Psychotropic Code Status Info: Full Allergies Info: No known allergies Psychotropic Info: appears to get haldol q 28 days         Current Medications (11/29/2020):  This is the current hospital active medication list Current Facility-Administered Medications  Medication Dose Route Frequency Provider Last Rate Last Admin  . 0.9 %  sodium chloride infusion (Manually program via Guardrails IV Fluids)   Intravenous Once 01/27/2021, MD      . 0.9 %  sodium chloride infusion (Manually program via Guardrails IV Fluids)   Intravenous Once Karl Ito, MD   Held at 11/16/20 [phone]  . 0.9 %  sodium chloride infusion  250 mL Intravenous Continuous 4128, MD 20 mL/hr at 11/25/20 0643 250 mL at 11/25/20 0643  . 0.9 %  sodium chloride infusion   Intravenous Continuous 01/23/21, MD 10 mL/hr at 11/18/20 1900 Infusion Verify at 11/18/20 1900  . acetaminophen (TYLENOL) tablet 650 mg  650 mg Oral Q6H PRN Cheri Fowler, MD       Or  . acetaminophen (TYLENOL) suppository 650 mg  650 mg Rectal Q6H PRN Cheri Fowler, MD      . albuterol (PROVENTIL) (2.5 MG/3ML) 0.083% nebulizer solution 2.5 mg  2.5 mg Nebulization Once PRN Dow Adolph N, DO      . alteplase (CATHFLO ACTIVASE) injection 2 mg  2 mg Intracatheter Once PRN Cheri Fowler, MD      . atorvastatin (LIPITOR) tablet 40 mg  40 mg Per NG tube Daily Charlsie Quest, MD   40 mg at 11/29/20 0913  . benztropine (COGENTIN) tablet 1 mg  1 mg Per NG tube QHS Zierle-Ghosh, Asia B, DO   1 mg at 11/28/20 2041  . carvedilol (COREG) tablet 3.125 mg  3.125 mg Per NG tube BID WC Zierle-Ghosh, Asia B, DO   3.125 mg at 11/29/20 0913  . chlorhexidine (PERIDEX) 0.12 % solution 15 mL  15 mL Mouth Rinse BID Marlin Canary U, DO   15 mL at 11/29/20 0914  . Chlorhexidine Gluconate Cloth 2 % PADS 6 each  6 each Topical Daily Joseph Art, DO   6 each at 11/29/20 0932  . feeding supplement (OSMOLITE 1.5 CAL) liquid 1,000 mL  1,000 mL Per Tube Continuous Darlin Drop, DO 40 mL/hr at 11/29/20 0524 Rate Change at 11/29/20 0524  . feeding supplement (PROSource TF) liquid 45 mL  45 mL Per Tube TID Dow Adolph N, DO   45 mL at 11/29/20 0933  . free water 150 mL  150 mL Per Tube Q4H Hall, Carole N, DO   150 mL at 11/29/20 0800  . [START ON 11/30/2020] furosemide (LASIX) tablet 40 mg  40 mg Per Tube Daily Uzbek[place], Eric J, DO      . insulin aspart (novoLOG) injection 0-9 Units  0-9 Units Subcutaneous Q4H Cheri Fowler, MD   2 Units at 11/29/20 1150  . levETIRAcetam (KEPPRA) IVPB 500 mg/100 mL premix  500 mg Intravenous Q12H Whiteheart, Kathryn A, NP 400 mL/hr at 11/29/20 0528 500 mg at 11/29/20 0528  . MEDLINE mouth rinse  15 mL Mouth Rinse q12n4p Cheri Fowler, MD   15 mL at 11/28/20 1248  . mirtazapine (REMERON SOL-TAB) disintegrating tablet 15 mg  15 mg Per NG tube QHS Zierle-Ghosh, Asia B, DO   15 mg at 11/28/20 2042  . multivitamin with minerals tablet 1 tablet  1 tablet Oral Daily Dow Adolph N, DO   1 tablet at 11/29/20 0913  . ondansetron (ZOFRAN) injection 4 mg  4 mg Intravenous Q6H PRN Opyd, Lavone Neri, MD      . pantoprazole sodium (PROTONIX) 40 mg/20 mL oral suspension 40 mg  40 mg Per Tube BID [place], Eric J, DO   40 mg at 11/29/20 0914   Followed by  . [START ON 12/29/2020] pantoprazole sodium (PROTONIX) 40 mg/20 mL oral suspension 40 mg  40 mg Per Tube Daily  [place], Eric J, DO      . potassium chloride (KLOR-CON) packet 20 mEq  20 mEq Per Tube BID Dow Adolph N, DO   20 mEq at 11/29/20 0914  . sodium chloride flush (NS) 0.9 % injection 10-40 mL  10-40 mL Intracatheter Q12H Kirtland Bouchard, MD   10 mL at 11/29/20 0935  . sodium chloride flush (NS) 0.9 % injection 10-40 mL  10-40 mL Intracatheter PRN Kirtland Bouchard, MD      . sodium chloride  flush (NS) 0.9 % injection 10-40 mL  10-40 mL Intracatheter Q12H Hall, Carole N, DO   10 mL at 11/29/20 0935  . sodium chloride flush (NS) 0.9 % injection 10-40 mL  10-40 mL Intracatheter PRN Kayleen Memos, DO         Discharge Medications: Please see discharge summary for a list of discharge medications.  Relevant Imaging Results:  Relevant Lab Results:   Additional Information SSN [id number]  Joanne Chars, LCSW

## 2020-11-29 NOTE — Progress Notes (Signed)
Calorie Count Note  RD consulted for calorie count for the next 24 hours.  He is currently on dysphagia 1 diet with thin liquids. RN reports mentation is main barrier for proper PO intake. Able to take bites and sips throughout the day but not much else. Given prolonged period without adequate PO nutrition, plan PEG placement this week.   Once placed will establish tolerance and possibly transition to bolus or nocturnal feedings.    For now continue current regimen of: -Osmolite 1.5 @ 60 ml/hr via Cortrak (1440 ml) -ProSource TF 45 ml TID  Provides: 2280 kcals, 123 grams protein, 1997 ml free water.    Vanessa Kick RD, LDN Clinical Nutrition Pager listed in AMION

## 2020-11-29 NOTE — Progress Notes (Signed)
RN advanced tube feeding to 76ml/hr at 0500 this AM. Return to this shift and tube feeding is running at 65ml/hr. According to charting, no advancement of tube feeding has been made on day shift. Will continue to advance tube feedings per order with goal of 60 ml/hr.

## 2020-11-29 NOTE — TOC Progression Note (Addendum)
Transition of Care [facility]) - Progression Note    Patient Details  Name: Ricky Hanson MRN: [id number] Date of Birth: 1951/10/14  [facility] CM/SW Contact  Lorri Frederick, LCSW Phone Number: 11/29/2020, 8:38 AM  Clinical Narrative:   [facility]erkley Harvey request sent to [facility].  CSW spoke with Shazma at [facility] and they can receive pt today or tomorrow when ready and authorized.  1515: spoke with Shazma at [facility] and informed her of peg tube placement tomorrow.  She said [facility] cannot accept pt with peg tube.  1530: CSW spoke with pt and informed him of the above.  Pt acknowledged when CSW told him I would be searching for another SNF placement.  CSW called and LM with pt son [name].    Pt sent out in hub for SNF.    Expected Discharge Plan: Skilled Nursing Facility Barriers to Discharge: Continued Medical Work up  Expected Discharge Plan and Services Expected Discharge Plan: Skilled Nursing Facility       Living arrangements for the past 2 months: Skilled Nursing Facility                                       Social Determinants of Health (SDOH) Interventions    Readmission Risk Interventions No flowsheet data found.

## 2020-11-29 NOTE — Progress Notes (Signed)
Palliative Medicine RN Note: Mr Teschner son called our office back. Phone meeting set up with him and [name], NP, for tomorrow afternoon.  Margret Chance Broughton Eppinger, RN, BSN, [facility] Palliative Medicine Team 11/29/2020 11:43 AM Office [phone]

## 2020-11-29 NOTE — Progress Notes (Signed)
IR consulted by Dr. Uzbekistan for possible image-guided percutaneous gastrostomy tube placement.  Case/images have been reviewed by Dr. Loreta Ave who states patient's anatomy is favorable for percutaneous gastrostomy tube placement, however recommends holding off on placement until bowel obstruction/ileus resolves. Recommends serial DG abdominal films to assess for resolution- will begin work-up for percutaneous gastrostomy tube placement once resolved. Dr. Uzbekistan made aware.  Please call IR with questions/concerns.   Waylan Boga Amarie Tarte, PA-C 11/29/2020, 11:31 AM

## 2020-11-29 NOTE — Progress Notes (Signed)
  Speech Language Pathology Treatment: Dysphagia  Patient Details Name: Ricky Hanson MRN: [id number] DOB: 1951-02-27 Today's Date: 11/29/2020 Time: 6967-8938 SLP Time Calculation (min) (ACUTE ONLY): 10 min  Assessment / Plan / Recommendation Clinical Impression  Pt awake and eager for water when I arrived. Pt consumed 6 oz of water with no coughing, swallow response did seem mildly abnormal subjectively but appears to be protecting airway. Pt also able to consume 3 oz of puree without oral holding. Updated diet order to no room service so meals will arrive automatically. Pt may take more if meals arrive to room regularly.   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 He was admitted on 12/15 secondary to confusion and found to have obstructive uropathy and acute renal insufficiency. He developed ventricular fibrillation and cardiac arrest on 12/22. EEG 12/22: severe diffuse encephalopathy; no seizures. 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      SLP Plan  Continue with current plan of care       Recommendations  Diet recommendations: Dysphagia 1 (puree);Thin liquid Liquids provided via: Cup;Straw Medication Administration: Crushed with puree Supervision: Staff to assist with self feeding;Full supervision/cueing for compensatory strategies Compensations: Slow rate;Small sips/bites;Follow solids with liquid Postural Changes and/or Swallow Maneuvers: Seated upright 90 degrees;Upright 30-60 min after meal                Oral Care Recommendations: Oral care BID Follow up Recommendations: Skilled Nursing facility SLP Visit Diagnosis: Dysphagia, unspecified (R13.10) Plan: Continue with current plan of care        GO                Takeshi Teasdale, Riley Nearing 11/29/2020, 1:49 PM

## 2020-11-29 NOTE — Plan of Care (Signed)
  Problem: Respiratory: Goal: Respiratory symptoms related to disease process will be avoided Outcome: Progressing   Problem: Clinical Measurements: Goal: Respiratory complications will improve Outcome: Progressing   Problem: Clinical Measurements: Goal: Cardiovascular complication will be avoided Outcome: Progressing   Problem: Education: Goal: Knowledge of disease and its progression will improve Outcome: Not Progressing  Pt oriented only to self Problem: Activity: Goal: Activity intolerance will improve Outcome: Not Progressing  Pt is becoming weak. Difficulty pulling self up in bed.

## 2020-11-30 ENCOUNTER — Inpatient Hospital Stay (HOSPITAL_COMMUNITY): Payer: Medicare Other

## 2020-11-30 DIAGNOSIS — N179 Acute kidney failure, unspecified: Secondary | ICD-10-CM | POA: Diagnosis not present

## 2020-11-30 DIAGNOSIS — J9690 Respiratory failure, unspecified, unspecified whether with hypoxia or hypercapnia: Secondary | ICD-10-CM | POA: Diagnosis not present

## 2020-11-30 DIAGNOSIS — I739 Peripheral vascular disease, unspecified: Secondary | ICD-10-CM | POA: Diagnosis not present

## 2020-11-30 DIAGNOSIS — R1310 Dysphagia, unspecified: Secondary | ICD-10-CM

## 2020-11-30 DIAGNOSIS — Z66 Do not resuscitate: Secondary | ICD-10-CM

## 2020-11-30 DIAGNOSIS — Z978 Presence of other specified devices: Secondary | ICD-10-CM | POA: Diagnosis not present

## 2020-11-30 DIAGNOSIS — I469 Cardiac arrest, cause unspecified: Secondary | ICD-10-CM | POA: Diagnosis not present

## 2020-11-30 DIAGNOSIS — N1832 Chronic kidney disease, stage 3b: Secondary | ICD-10-CM | POA: Diagnosis not present

## 2020-11-30 DIAGNOSIS — I10 Essential (primary) hypertension: Secondary | ICD-10-CM | POA: Diagnosis not present

## 2020-11-30 LAB — GLUCOSE, CAPILLARY
Glucose-Capillary: 204 mg/dL — ABNORMAL HIGH (ref 70–99)
Glucose-Capillary: 151 mg/dL — ABNORMAL HIGH (ref 70–99)
Glucose-Capillary: 168 mg/dL — ABNORMAL HIGH (ref 70–99)
Glucose-Capillary: 195 mg/dL — ABNORMAL HIGH (ref 70–99)

## 2020-11-30 LAB — BASIC METABOLIC PANEL
Anion gap: 11 (ref 5–15)
BUN: 22 mg/dL (ref 8–23)
CO2: 25 mmol/L (ref 22–32)
Calcium: 8.3 mg/dL — ABNORMAL LOW (ref 8.9–10.3)
Chloride: 104 mmol/L (ref 98–111)
Creatinine, Ser: 1.35 mg/dL — ABNORMAL HIGH (ref 0.61–1.24)
GFR, Estimated: 58 mL/min — ABNORMAL LOW (ref >60)
Glucose, Bld: 166 mg/dL — ABNORMAL HIGH (ref 70–99)
Potassium: 4.7 mmol/L (ref 3.5–5.1)
Sodium: 140 mmol/L (ref 135–145)

## 2020-11-30 MED ORDER — LEVETIRACETAM 100 MG/ML PO SOLN
500.0000 mg | Freq: Two times a day (BID) | ORAL | Status: DC
  Administered 2020-12-01 – 2020-12-03 (×6): 500 mg
  Filled 2020-11-30 (×8): qty 5

## 2020-11-30 MED ORDER — BIOTENE DRY MOUTH MT LIQD: 15.0000 mL | OROMUCOSAL | Status: DC | PRN

## 2020-11-30 MED ORDER — ONDANSETRON HCL 4 MG/2ML IJ SOLN: 4.0000 mg | Freq: Four times a day (QID) | INTRAMUSCULAR | Status: DC | PRN

## 2020-11-30 MED ORDER — MORPHINE SULFATE (PF) 2 MG/ML IV SOLN
1.0000 mg | INTRAVENOUS | Status: DC | PRN
  Filled 2020-11-30: qty 1

## 2020-11-30 MED ORDER — POLYVINYL ALCOHOL 1.4 % OP SOLN: 1.0000 [drp] | Freq: Four times a day (QID) | OPHTHALMIC | Status: DC | PRN

## 2020-11-30 MED ORDER — MORPHINE SULFATE (CONCENTRATE) 10 MG/0.5ML PO SOLN: 5.0000 mg | ORAL | Status: DC | PRN

## 2020-11-30 MED ORDER — GLYCOPYRROLATE 0.2 MG/ML IJ SOLN: 0.2000 mg | INTRAMUSCULAR | Status: DC | PRN

## 2020-11-30 MED ORDER — LORAZEPAM 2 MG/ML PO CONC: 0.5000 mg | ORAL | Status: DC | PRN

## 2020-11-30 MED ORDER — LORAZEPAM 2 MG/ML IJ SOLN
0.5000 mg | INTRAMUSCULAR | Status: DC | PRN
  Filled 2020-11-30: qty 1

## 2020-11-30 NOTE — Progress Notes (Signed)
Phone conference scheduled with son today from 1pm-2pm as he requested due to limited availability. I have attempted to call son twice since 1pm with no response. Voicemail left with return number. Will attempt again prior to 2pm.   [name], AGPCNP-BC Palliative Medicine Team  Phone: [phone]  No CHARGE

## 2020-11-30 NOTE — Progress Notes (Signed)
PT Cancellation Note  Patient Details Name: Ricky Hanson MRN: [id number] DOB: 04-14-1951   Cancelled Treatment:    Reason Eval/Treat Not Completed: Other (comment) Noted pt to have palliative consult this afternoon with likely transition to comfort/hospice.  Spoke with RN who reports pt is not alert.   Will hold PT today. Anise Salvo, PT Acute Rehab Services Pager [phone] [facility] [phone]    Rayetta Humphrey 11/30/2020, 1:24 PM
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
## 2020-11-30 NOTE — Progress Notes (Signed)

## 2020-11-30 NOTE — TOC Progression Note (Addendum)
Transition of Care [facility]) - Progression Note    Patient Details  Name: Ricky Hanson MRN: [id number] Date of Birth: 02/16/1951  [facility] CM/SW Contact  Lorri Frederick, LCSW Phone Number: 11/30/2020, 8:55 AM  Clinical Narrative:   CSW received call from pt son [name].  Discussed pt not able to return to [facility] and search for new SNF placement.  [name] stated that he is in agreement with plan to move to comfort care/hospice at this time.  [name] stated that he had received call from Palliative RN but had not been able to speak with her.  CSW LM with Palliative amion phone number to update.   1530: Shazma at [facility] called back: they can take pt back with peg tube, but not with NG tube.  Updated her on possible comfort care.  1545: Spoke with son after being updated by palliative on decision to move to comfort care.  Son [facility] as there is a family member in [place] who will want to be nearby.  CSW spoke with Chrislyn at [facility] and she will review the chart and reach out to the family after consulting her MD.     Expected Discharge Plan: Skilled Nursing Facility Barriers to Discharge: Continued Medical Work up  Expected Discharge Plan and Services Expected Discharge Plan: Skilled Nursing Facility       Living arrangements for the past 2 months: Skilled Nursing Facility                                       Social Determinants of Health (SDOH) Interventions    Readmission Risk Interventions No flowsheet data found.

## 2020-11-30 NOTE — Progress Notes (Signed)
Nutrition Brief Note  Chart reviewed. Pt now transitioning to comfort care.  No further nutrition interventions warranted at this time.  Please re-consult as needed.   Carlon Chaloux RD, LDN Clinical Nutrition Pager listed in AMION    

## 2020-11-30 NOTE — Progress Notes (Addendum)
Daily Progress Note   Patient Name: Ricky Hanson       Date: 11/30/2020 DOB: Nov 25, 1951  Age: 70 y.o. MRN#: [id number] Attending Physician: [place], Eric J, DO Primary Care Physician: Hoy Register, MD Admit Date: 11/08/2020  Reason for Consultation/Follow-up: Establishing goals of care  Chart Reviewed and RN Updates received.   Patient continues to have worsening dysphagia requiring tube feeds. Most likely will need PEG placement to maintain sufficient nutritional needs. Recent chest-xray on 11/27/20 showed bilateral effusions and pulmonary edema. Continues on Keppra for seizures.   I was able to have a phone conference with patient's son, [name] and patient's cousin, [name]. Updates provided to family. Both [name] and [name] verbalized understanding and appreciation, expressing similar updates from attending on yesterday. [name] reports he was originally considering PEG placement for his father however, after family discussion feels patient's quality of life continues to decline and also concerned that patient would not return to previous functionality. Family also expressed concerns for increased levels of care that would be needed.   Detailed education and discussions regarding best case and worst case scenario given consideration of patient's current health challenges. Family expressed they do not feel patient would want to have a long-term PEG placed and would not be happy living in his current state of health.   [name] shares his emotional sadness of observing his father in his poor conditions when he recently visited patient over the weekend and seeing him in such a weak and fragile state. Emotional support provided.   I encouraged family as they made medical decisions go focus on patient's quality of life and what he would want for himself. Son verbalized understanding expressing he knows his father wouldn't want to live in his current state and if he was not going to make a recovery to at  minimum baseline.   Discussed with son in the setting of patient's recent stroke, severe dysphagia, recent SBO, cardiac arrest, and acute respiratory failure patient's prognosis is poor. Son verbalized understanding and agreed.   Family requested to discuss further options focusing on comfort. Detailed education provided on what comfort care measures would look like for patient in addition to the goals and philosophy of care of hospice. Family verbalized understanding. Son expressed he felt that hospice and comfort was most appropriate for his father, however he would like to include patient's sister in the decisions. We attempted to contact her however she was unavailable.   I created space and opportunity to also discuss patient's full codes status in consideration to his current illness with recommendations for DNR/DNI. Son verbalizes understanding and again would like to continue ongoing discussions with his family prior to making any final decisions.   All questions answered. Son has contact information with plans to call back once family has spoken and made final decisions.   1525: Son called with family members on the line. All family expressed their concerns for patient's poor quality of life and poor prognosis. They all mutually expressed wishes for him to be comfort and consideration for residential hospice home placement. [name] (son) confirmed wishes for comfort care measures and hospice referral. Son also confirms wishes for DNR/DNI. I provided additional education on what DNR will mean for patient, what comfort care will look like, and what the referral process will look like for hospice. Family verbalized understanding and again confirmed their agreement and appreciation.   Length of Stay: 21 days  Vital Signs: BP 135/70   Pulse 82   Temp 98.5  F (36.9 C) (Axillary)   Resp 15   Ht 6\' 1"  (1.854 m)   Wt 84.3 kg   SpO2 94%   BMI 24.52 kg/m  SpO2: SpO2: 94 % O2 Device: O2 Device:  Nasal Cannula O2 Flow Rate: O2 Flow Rate (L/min): 2 L/min           Palliative Care Assessment & Plan    Code Status:  DNR  Goals of Care/Recommendations:  DNR/DNI-as requested and confirmed by son,  Transition all care to focus on comfort. Will discontinue all orders not focused on comfort/symptom management.   Discussed at length with family patient's poor prognosis, with detailed education regarding comfort care measures and hospice.   Family requesting [facility] referral. (TOC, Greg,LCSW aware of family's request) Morphine PRN for pain/air hunger/comfort Robinul PRN for excessive secretions Ativan PRN for agitation/anxiety Zofran PRN for nausea Liquifilm tears PRN for dry eyes May have comfort feeding Unrestricted visitations in the setting of EOL (per policy) Oxygen PRN 2L or less for comfort. No escalation.   PMT will continue to support and follow.   Prognosis: Poor (weeks) in the setting of recent stroke, severe dysphagia, SBO, recent PEA/cardiac arrest, CHF, respiratory failure, and overall functional and nutritional decline.   Discharge Planning: Hospice facility as requested by family.   Thank you for allowing the Palliative Medicine Team to assist in the care of this patient.  Time Total: 75 min.   Visit consisted of counseling and education dealing with the complex and emotionally intense issues of symptom management and palliative care in the setting of serious and potentially life-threatening illness.Greater than 50%  of this time was spent counseling and coordinating care related to the above assessment and plan.  [name], AGPCNP-BC  Palliative Medicine Team [phone]

## 2020-12-01 DIAGNOSIS — N1832 Chronic kidney disease, stage 3b: Secondary | ICD-10-CM | POA: Diagnosis not present

## 2020-12-01 DIAGNOSIS — J9690 Respiratory failure, unspecified, unspecified whether with hypoxia or hypercapnia: Secondary | ICD-10-CM | POA: Diagnosis not present

## 2020-12-01 DIAGNOSIS — I469 Cardiac arrest, cause unspecified: Secondary | ICD-10-CM | POA: Diagnosis not present

## 2020-12-01 DIAGNOSIS — N179 Acute kidney failure, unspecified: Secondary | ICD-10-CM | POA: Diagnosis not present

## 2020-12-01 DIAGNOSIS — Z978 Presence of other specified devices: Secondary | ICD-10-CM | POA: Diagnosis not present

## 2020-12-01 NOTE — Progress Notes (Addendum)
[employer] engineer, [employer] [facility]) Hospital Liaison note.    Addendum:  Per [facility] MD pt is eligible for residential hospice.  Unfortunately, no beds available at this time.    Chart and pt information still being reviewed by [facility] physician.  Residential hospice eligibility pending at this time.  [facility] is unable to offer a room today. Hospital Liaison will follow up tomorrow or sooner if a room becomes available. Please do not hesitate to call with questions.    Thank you for the opportunity to participate in this patients care.  Gillian Scarce, BSN, RN [facility] Liaison (listed on [place] under Hospice/[facility])    [phone] (24h on call)

## 2020-12-01 NOTE — Plan of Care (Signed)
  Problem: Education: Goal: Knowledge of disease and its progression will improve Outcome: Progressing   

## 2020-12-01 NOTE — Progress Notes (Signed)
   Daily Progress Note   Patient Name: Ricky Hanson       Date: 12/01/2020 DOB: [birth year]  Age: 70 y.o. MRN#: [id number] Attending Physician: [place], Eric J, DO Primary Care Physician: Hoy Register, MD Admit Date: 11/08/2020  Reason for Consultation/Follow-up: Non pain symptom management, Pain control and Psychosocial/spiritual support  Subjective: Chart reviewed. Patient comfortable. No family present.   Updates provided to [facility]. Patient is eligible for [facility], however there are currently no beds available.   Length of Stay: 22 days  Vital Signs: BP 122/79 (BP Location: Right Arm)   Pulse 83   Temp 97.6 F (36.4 C) (Oral)   Resp 14   Ht 6\' 1"  (1.854 m)   Wt 84.3 kg   SpO2 95%   BMI 24.52 kg/m  SpO2: SpO2: 95 % O2 Device: O2 Device: Room Air O2 Flow Rate: O2 Flow Rate (L/min): 2 L/min       Palliative Care Assessment & Plan   Code Status:  DNR  Goals of Care/Recommendations:  Continue comfort care measures.   Pending bed availability.   PMT will continue to support and follow as needed.   Prognosis: POOR   Discharge Planning: Hospice facility  Thank you for allowing the Palliative Medicine Team to assist in the care of this patient.  Time Total: 20 min.   Visit consisted of counseling and education dealing with the complex and emotionally intense issues of symptom management and palliative care in the setting of serious and potentially life-threatening illness.Greater than 50%  of this time was spent counseling and coordinating care related to the above assessment and plan.  [employer], AGPCNP-BC  Palliative Medicine Team [phone]
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
## 2020-12-01 NOTE — Progress Notes (Signed)
SLP Cancellation Note  Patient Details Name: Ricky Hanson MRN: [id number] DOB: 22-Oct-1951   Cancelled treatment:       Reason Eval/Treat Not Completed: Other (comment). Pt transitioning to comfort care. Will sign off at this time,    Alyannah Sanks, Riley Nearing 12/01/2020, 7:40 AM

## 2020-12-02 DIAGNOSIS — I469 Cardiac arrest, cause unspecified: Secondary | ICD-10-CM | POA: Diagnosis not present

## 2020-12-02 DIAGNOSIS — N1832 Chronic kidney disease, stage 3b: Secondary | ICD-10-CM | POA: Diagnosis not present

## 2020-12-02 DIAGNOSIS — N179 Acute kidney failure, unspecified: Secondary | ICD-10-CM | POA: Diagnosis not present

## 2020-12-02 DIAGNOSIS — Z978 Presence of other specified devices: Secondary | ICD-10-CM | POA: Diagnosis not present

## 2020-12-02 NOTE — Progress Notes (Signed)
PROGRESS NOTE    Ricky Hanson  ZOX:[id number] DOB: 09/28/1951 DOA: 11/08/2020 PCP: Hoy RegisterNewlin, Enobong, MD    Brief Narrative:  Ricky Aspennthony Persichetti is a 70 year old male with past medical history significant for type 2 diabetes mellitus, benign essential hypertension, hyperlipidemia, schizophrenia.  Patient was initially admitted to the hospital on 12/15 for AKI, obstructive uropathy.  On 12/22, patient developed V. fib and cardiac arrest, was pulseless for about 20 minutes, successfully resuscitated, intubated and transferred to ICU.  12/24, MRI brain showed a small subacute to chronic white matter infarct in the subcortical right frontal lobe with no acute insult.  Transferred out of ICU to hospitalist service on 11/20/20.  Hospital course complicated by SBO diagnosed on 11/23/2020.  General surgery was consulted.  Patient had a left inguinal hernia that was reduced on 11/23/2020.  The next day chest x-ray showed improvement in SBO.  Also showed incidental finding of small apical right pneumothorax.  Was seen by PCCM who recommended to monitor as there is no indication for chest tube placement at this time.  On 11/25/2020 he was started on clear liquid diet however appears to have worsening dysphagia the following day while attempting to swallow his pills with pure.  Speech therapist was reconsulted on 11/26/2020.  Acute blood loss anemia with hemoglobin down to 7.2.  Gastric fluid positive for blood.  Due to concern for upper GI bleed he was started on IV PPI twice daily.    Assessment & Plan:   Principal Problem:   ARF (acute renal failure) (HCC) Active Problems:   Chronic indwelling Foley catheter   Peripheral vascular disease (HCC)   CKD (chronic kidney disease), stage III (HCC)   Essential hypertension   AKI (acute kidney injury) (HCC)   Cardiac arrest (HCC)   Acute hypoxic respiratory failure, POA Aspiration pneumonia: resolved status post VT/VF cardiac arrest - ~4820mins  downtime Bilateral pleural effusions right greater than left, and pulmonary edema. Completed 7 day course of cefepime on 12/21 and 5 day course of Zosyn on 12/26.  Now transitioning to comfort measures/hospice. --[facility], none available today.  Worsening dysphagia in the setting of recent stroke Seen by speech therapy, failed swallow evaluation multiple times. Core track replaced on 11/27/2020.  Now on comfort measures, discontinued tube feeds.  Continue core track for medication administration.  Bilateral pleural effusions, pulmonary edema right greater than left, with concern for volume overload, worsening atelectasis. Acute on chronic diastolic congestive heart failure. Elevated BNP greater than 1000. He had received IV fluid in the setting of SBO. Chest x-ray done on 11/27/2020 which shows bilateral pleural effusions and pulmonary edema right greater than left.  Discontinued Lasix and carvedilol, continue comfort measures.  Acute blood loss anemia, possibly upper GI bleed Drop in hemoglobin down to 7.0 on 11/26/2020. Transfused 1u pRBC on 11/26/20.  Resolved SBO on abdominal x-ray and CT scan  Left inguinal hernia, reduced by general surgery on 11/23/2020 He was consulted and followed during hospital course.  Left inguinal hernia was reduced by general surgery on 11/23/2020.  Was started on clear liquid diet on 11/24/2020.due to concern for worsening dysphagia, patient was made n.p.o. on 11/26/2020 and speech therapist was consulted.   Due to worsening bilateral pleural effusions right greater than left, rate of IV fluid was decreased to 30 cc/h on 11/26/2020 and stopped on 11/27/2020. Repeated abdominal x-ray done on 11/24/2020 shows improvement of SBO, abdominal x-ray done on 11/25/2020 showed the same.  Care now transition to comfort  Incidental findings, Small right apical pneumothorax seen on chest x-ray done on 11/24/2020 Mild right basilar hydropneumothorax. Seen by PCCM,  recommended conservative management, no indication for tube placement at this time.  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 Neurology recommends continuing Keppra 500 mg BID.  Right frontal subacute vs acute infarct MRI brain 12/24 showed a small subacute to chronic white matter infarct in the subcortical right frontal lobe with no acute insult. MRA head and bilateral carotid duplex unremarkable. TTE showed normal ejection fraction without cardiac source of embolism.  Seen by neurology while inpatient.  Discontinued statin now on comfort measures.  Concern for upper GI bleed in the setting of acute on chronic anemia Hemoglobin decreased to 7.0 on 11/26/20, gastric fluid positive for blood.  Status post 1 unit PRBC.  Seen by gastroenterology, Dr. Chales Abrahams on 12/31; with recommendations of holding heparin/aspirin, holding off on EGD and PPI twice daily.  Now DC PPI on comfort measures.  Acute kidney injury on CKD stage IIIa suspect prerenal in the setting of dehydration from poor intake. Baseline creatinine appears to be 1.3 with GFR of 56.  CT renal stone study on admission with markedly distended urinary bladder extending to the umbilicus with bilateral hydroutero nephrosis consistent with urinary retention, did note Foley catheter in place with balloon appearing to be in the urethra and not draining the bladder.  Foley catheter replaced, and creatinine down to 1.35.  Will have Foley remain in place while on comfort measures.  Essential hypertension Discontinued carvedilol  Goals of care Poor prognosis in the setting of recent PEA/cardiac arrest, recent stroke, severe dysphagia, moderate protein calorie malnutrition, recent SBO, acute on chronic diastolic CHF, bilateral pleural effusions/pulmonary edema, acute hypoxic respiratory failure, poor  functional status. Palliative care team was consulted and followed during the hospital course.  Patient now transition to comfort measures.  Awaiting bed at [facility].    DVT prophylaxis: SCDs Code Status: DNR/comfort measures Family Communication: No family present at bedside this morning  Disposition Plan:  Status is: Inpatient  Remains inpatient appropriate because:Unsafe d/c plan, IV treatments appropriate due to intensity of illness or inability to take PO and Inpatient level of care appropriate due to severity of illness   Dispo:  Patient From: Home  Planned Disposition: Residential Hospice Awaiting bed at [facility], none available today.  Expected discharge date: 12/01/2020  Medically stable for discharge: Yes   Consultants:   PCCM: signed off 12/28  [facility]: signed off 12/31  Neurology: signed off 12/28  General Surgery - signed off 11/27/2020  Palliative Care  Procedures:   Central Line  Arterial line  Intubation 12/22  Antimicrobials:   Cefepime 12/15 - 12/21  Zosyn 11/22 - 11/26  Ceftriaxone 12/15 - 12/15    Subjective: Patient seen and examined bedside, resting comfortably.  Alert, nonverbal.  On comfort measures awaiting residential hospice placement, no event beds today per hospital liaison.  No acute concerns per nursing staff.  Objective: Vitals:   11/30/20 0849 11/30/20 1114 12/01/20 0609 12/01/20 2159  BP: 128/74 135/70 122/79 128/83  Pulse:   83 89  Resp: 14 15 14 16   Temp: 98.5 F (36.9 C) 98.5 F (36.9 C) 97.6 F (36.4 C) 97.9 F (36.6 C)  TempSrc: Axillary Axillary Oral Oral  SpO2: 93% 94% 95% 97%  Weight:      Height:  Intake/Output Summary (Last 24 hours) at 12/02/2020 1027 Last data filed at 12/01/2020 1046 Gross per 24 hour  Intake --  Output 1000 ml  Net -1000 ml   Filed Weights   11/23/20 0427 11/24/20 0500 11/26/20 0527  Weight: 82.1 kg 79.8 kg 84.3 kg    Examination:  General  exam: Appears calm and comfortable; frail/chronically ill in appearance Respiratory system: Decreased breath sounds bilateral bases, otherwise clear to auscultation, normal respiratory effort on 2 L nasal cannula with SPO2 99%. Cardiovascular system: S1 & S2 heard, RRR. No JVD, murmurs, rubs, gallops or clicks. No pedal edema. Gastrointestinal system: Abdomen is nondistended, soft and nontender. No organomegaly or masses felt. Normal bowel sounds heard. GU: foley noted with clear yellow urine. Central nervous system: Alert. No focal neurological deficits. Extremities: noted right BKA Skin: No rashes, lesions or ulcers Psychiatry: Judgement and insight appear poor. Depressed mood & flat affect.     Data Reviewed: I have personally reviewed following labs and imaging studies  CBC: Recent Labs  Lab 11/25/20 1831 11/26/20 0413 11/27/20 1021 11/28/20 0351 11/29/20 0358  WBC  --  9.0 11.5* 10.5 12.6*  HGB 7.3* 7.0* 9.5* 9.5* 10.9*  HCT 24.1* 23.1* 31.1* 30.1* 35.8*  MCV  --  88.8 86.9 84.6 87.5  PLT  --  277 303 330 333   Basic Metabolic Panel: Recent Labs  Lab 11/26/20 0413 11/27/20 1021 11/28/20 0351 11/29/20 0358 11/30/20 0323  NA 149* 143 143 141 140  K 4.2 4.2 3.9 4.9 4.7  CL 114* 108 109 106 104  CO2 23 23 25 25 25   GLUCOSE 126* 143* 152* 157* 166*  BUN 23 23 24* 25* 22  CREATININE 2.01* 1.75* 1.65* 1.63* 1.35*  CALCIUM 8.2* 8.4* 8.2* 8.0* 8.3*   GFR: Estimated Creatinine Clearance: 60 mL/min (A) (by C-G formula based on SCr of 1.35 mg/dL (H)). Liver Function Tests: No results for input(s): AST, ALT, ALKPHOS, BILITOT, PROT, ALBUMIN in the last 168 hours. No results for input(s): LIPASE, AMYLASE in the last 168 hours. No results for input(s): AMMONIA in the last 168 hours. Coagulation Profile: No results for input(s): INR, PROTIME in the last 168 hours. Cardiac Enzymes: No results for input(s): CKTOTAL, CKMB, CKMBINDEX, TROPONINI in the last 168 hours. BNP (last  3 results) No results for input(s): PROBNP in the last 8760 hours. HbA1C: No results for input(s): HGBA1C in the last 72 hours. CBG: Recent Labs  Lab 11/29/20 1941 11/29/20 2348 11/30/20 0308 11/30/20 0802 11/30/20 1111  GLUCAP 188* 170* 151* 168* 195*   Lipid Profile: No results for input(s): CHOL, HDL, LDLCALC, TRIG, CHOLHDL, LDLDIRECT in the last 72 hours. Thyroid Function Tests: No results for input(s): TSH, T4TOTAL, FREET4, T3FREE, THYROIDAB in the last 72 hours. Anemia Panel: No results for input(s): VITAMINB12, FOLATE, FERRITIN, TIBC, IRON, RETICCTPCT in the last 72 hours. Sepsis Labs: No results for input(s): PROCALCITON, LATICACIDVEN in the last 168 hours.  No results found for this or any previous visit (from the past 240 hour(s)).       Radiology Studies: No results found.      Scheduled Meds: . benztropine  1 mg Per NG tube QHS  . levETIRAcetam  500 mg Per Tube BID  . mouth rinse  15 mL Mouth Rinse q12n4p  . sodium chloride flush  10-40 mL Intracatheter Q12H  . sodium chloride flush  10-40 mL Intracatheter Q12H   Continuous Infusions: . sodium chloride 10 mL/hr at 11/18/20 1900  LOS: 23 days    Time spent: 34 minutes spent on chart review, discussion with nursing staff, consultants, updating family and interview/physical exam; more than 50% of that time was spent in counseling and/or coordination of care.    Alvira Philips [place], DO [facility] Available via Epic secure chat 7am-7pm After these hours, please refer to coverage provider listed on [email or web address] 12/02/2020, 10:27 AM

## 2020-12-02 NOTE — Progress Notes (Signed)
[facility] ([facility])  There is not a bed available at [facility] today.  [facility] will update hospital staff and family once our bed availability changes.  Wallis Bamberg RN, BSN, CCRN [facility] Liaison

## 2020-12-03 DIAGNOSIS — N179 Acute kidney failure, unspecified: Secondary | ICD-10-CM | POA: Diagnosis not present

## 2020-12-03 DIAGNOSIS — N1832 Chronic kidney disease, stage 3b: Secondary | ICD-10-CM | POA: Diagnosis not present

## 2020-12-03 DIAGNOSIS — I469 Cardiac arrest, cause unspecified: Secondary | ICD-10-CM | POA: Diagnosis not present

## 2020-12-03 DIAGNOSIS — Z978 Presence of other specified devices: Secondary | ICD-10-CM | POA: Diagnosis not present

## 2020-12-03 NOTE — Progress Notes (Signed)
PROGRESS NOTE    Ricky Hanson  ZOX:[id number] DOB: 06/10/1951 DOA: 11/08/2020 PCP: Hoy RegisterNewlin, Enobong, MD    Brief Narrative:  Ricky Hanson is a 70 year old male with past medical history significant for type 2 diabetes mellitus, benign essential hypertension, hyperlipidemia, schizophrenia.  Patient was initially admitted to the hospital on 12/15 for AKI, obstructive uropathy.  On 12/22, patient developed V. fib and cardiac arrest, was pulseless for about 20 minutes, successfully resuscitated, intubated and transferred to ICU.  12/24, MRI brain showed a small subacute to chronic white matter infarct in the subcortical right frontal lobe with no acute insult.  Transferred out of ICU to hospitalist service on 11/20/20.  Hospital course complicated by SBO diagnosed on 11/23/2020.  General surgery was consulted.  Patient had a left inguinal hernia that was reduced on 11/23/2020.  The next day chest x-ray showed improvement in SBO.  Also showed incidental finding of small apical right pneumothorax.  Was seen by PCCM who recommended to monitor as there is no indication for chest tube placement at this time.  On 11/25/2020 he was started on clear liquid diet however appears to have worsening dysphagia the following day while attempting to swallow his pills with pure.  Speech therapist was reconsulted on 11/26/2020.  Acute blood loss anemia with hemoglobin down to 7.2.  Gastric fluid positive for blood.  Due to concern for upper GI bleed he was started on IV PPI twice daily.    Assessment & Plan:   Principal Problem:   ARF (acute renal failure) (HCC) Active Problems:   Chronic indwelling Foley catheter   Peripheral vascular disease (HCC)   CKD (chronic kidney disease), stage III (HCC)   Essential hypertension   AKI (acute kidney injury) (HCC)   Cardiac arrest (HCC)   Acute hypoxic respiratory failure, POA Aspiration pneumonia: resolved status post VT/VF cardiac arrest - ~7020mins  downtime Bilateral pleural effusions right greater than left, and pulmonary edema. Completed 7 day course of cefepime on 12/21 and 5 day course of Zosyn on 12/26.  Now transitioning to comfort measures/hospice. --Await[facility] bed at [facility]  Worsening dysphagia in the setting of recent stroke Seen by speech therapy, failed swallow evaluation multiple times. Core track replaced on 11/27/2020.  Now on comfort measures, discontinued tube feeds.  Continue core track for medication administration. Comfort feeds as tolerates  Bilateral pleural effusions, pulmonary edema right greater than left, with concern for volume overload, worsening atelectasis. Acute on chronic diastolic congestive heart failure. Elevated BNP greater than 1000. He had received IV fluid in the setting of SBO. Chest x-ray done on 11/27/2020 which shows bilateral pleural effusions and pulmonary edema right greater than left.  Discontinued Lasix and carvedilol, continue comfort measures.  Acute blood loss anemia, possibly upper GI bleed Drop in hemoglobin down to 7.0 on 11/26/2020. Transfused 1u pRBC on 11/26/20.  Resolved SBO on abdominal x-ray and CT scan  Left inguinal hernia, reduced by general surgery on 11/23/2020 He was consulted and followed during hospital course.  Left inguinal hernia was reduced by general surgery on 11/23/2020.  Was started on clear liquid diet on 11/24/2020.due to concern for worsening dysphagia, patient was made n.p.o. on 11/26/2020 and speech therapist was consulted.   Due to worsening bilateral pleural effusions right greater than left, rate of IV fluid was decreased to 30 cc/h on 11/26/2020 and stopped on 11/27/2020. Repeated abdominal x-ray done on 11/24/2020 shows improvement of SBO, abdominal x-ray done on 11/25/2020 showed the same.  Care now transition to  comfort  Incidental findings, Small right apical pneumothorax seen on chest x-ray done on 11/24/2020 Mild right basilar hydropneumothorax. Seen by PCCM,  recommended conservative management, no indication for tube placement at this time.  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 Neurology recommends continuing Keppra 500 mg BID.  Right frontal subacute vs acute infarct MRI brain 12/24 showed a small subacute to chronic white matter infarct in the subcortical right frontal lobe with no acute insult. MRA head and bilateral carotid duplex unremarkable. TTE showed normal ejection fraction without cardiac source of embolism.  Seen by neurology while inpatient.  Discontinued statin now on comfort measures.  Concern for upper GI bleed in the setting of acute on chronic anemia Hemoglobin decreased to 7.0 on 11/26/20, gastric fluid positive for blood.  Status post 1 unit PRBC.  Seen by gastroenterology, Dr. Chales Abrahams on 12/31; with recommendations of holding heparin/aspirin, holding off on EGD and PPI twice daily.  Now DC PPI on comfort measures.  Acute kidney injury on CKD stage IIIa suspect prerenal in the setting of dehydration from poor intake. Baseline creatinine appears to be 1.3 with GFR of 56.  CT renal stone study on admission with markedly distended urinary bladder extending to the umbilicus with bilateral hydroutero nephrosis consistent with urinary retention, did note Foley catheter in place with balloon appearing to be in the urethra and not draining the bladder.  Foley catheter replaced, and creatinine down to 1.35.  Will have Foley remain in place while on comfort measures.  Essential hypertension Discontinued carvedilol  Goals of care Poor prognosis in the setting of recent PEA/cardiac arrest, recent stroke, severe dysphagia, moderate protein calorie malnutrition, recent SBO, acute on chronic diastolic CHF, bilateral pleural effusions/pulmonary edema, acute hypoxic respiratory failure, poor  functional status. Palliative care team was consulted and followed during the hospital course.  Patient now transition to comfort measures.  Awaiting bed at [facility].    DVT prophylaxis: SCDs Code Status: DNR/comfort measures Family Communication: No family present at bedside this morning  Disposition Plan:  Status is: Inpatient  Remains inpatient appropriate because:Unsafe d/c plan, IV treatments appropriate due to intensity of illness or inability to take PO and Inpatient level of care appropriate due to severity of illness   Dispo:  Patient From: Home  Planned Disposition: Residential Hospice Awaiting bed at [facility]  Expected discharge date: 12/01/2020  Medically stable for discharge: Yes   Consultants:   PCCM: signed off 12/28  [facility]: signed off 12/31  Neurology: signed off 12/28  General Surgery - signed off 11/27/2020  Palliative Care  Procedures:   Central Line  Arterial line  Intubation 12/22  Antimicrobials:   Cefepime 12/15 - 12/21  Zosyn 11/22 - 11/26  Ceftriaxone 12/15 - 12/15    Subjective: Patient seen and examined bedside, resting comfortably.  Alert, drinking some orange juice with coughing episode.  Requested head of bed to be more upright.  No other questions.  On comfort measures awaiting residential hospice placement. No acute concerns per nursing staff.  Objective: Vitals:   12/01/20 2159 12/02/20 1244 12/02/20 1244 12/02/20 2117  BP: 128/83  131/67 (!) 105/52  Pulse: 89  85 87  Resp: 16   17  Temp: 97.9 F (36.6 C) 97.6 F (36.4 C)  98.9 F (37.2 C)  TempSrc: Oral Oral  Oral  SpO2: 97%  (!) 88% 99%  Weight:  Height:        Intake/Output Summary (Last 24 hours) at 12/03/2020 1026 Last data filed at 12/03/2020 0500 Gross per 24 hour  Intake 150 ml  Output 1250 ml  Net -1100 ml   Filed Weights   11/23/20 0427 11/24/20 0500 11/26/20 0527  Weight: 82.1 kg 79.8 kg 84.3 kg     Examination:  General exam: Appears calm and comfortable; frail/chronically ill in appearance Respiratory system: Decreased breath sounds bilateral bases, otherwise clear to auscultation, normal respiratory effort on room air Cardiovascular system: S1 & S2 heard, RRR. No JVD, murmurs, rubs, gallops or clicks. No pedal edema. Gastrointestinal system: Abdomen is nondistended, soft and nontender. No organomegaly or masses felt. Normal bowel sounds heard. GU: foley noted with clear yellow urine. Central nervous system: Alert. No focal neurological deficits. Extremities: noted right BKA Skin: No rashes, lesions or ulcers Psychiatry: Judgement and insight appear poor. Depressed mood & flat affect.     Data Reviewed: I have personally reviewed following labs and imaging studies  CBC: Recent Labs  Lab 11/27/20 1021 11/28/20 0351 11/29/20 0358  WBC 11.5* 10.5 12.6*  HGB 9.5* 9.5* 10.9*  HCT 31.1* 30.1* 35.8*  MCV 86.9 84.6 87.5  PLT 303 330 333   Basic Metabolic Panel: Recent Labs  Lab 11/27/20 1021 11/28/20 0351 11/29/20 0358 11/30/20 0323  NA 143 143 141 140  K 4.2 3.9 4.9 4.7  CL 108 109 106 104  CO2 23 25 25 25   GLUCOSE 143* 152* 157* 166*  BUN 23 24* 25* 22  CREATININE 1.75* 1.65* 1.63* 1.35*  CALCIUM 8.4* 8.2* 8.0* 8.3*   GFR: Estimated Creatinine Clearance: 60 mL/min (A) (by C-G formula based on SCr of 1.35 mg/dL (H)). Liver Function Tests: No results for input(s): AST, ALT, ALKPHOS, BILITOT, PROT, ALBUMIN in the last 168 hours. No results for input(s): LIPASE, AMYLASE in the last 168 hours. No results for input(s): AMMONIA in the last 168 hours. Coagulation Profile: No results for input(s): INR, PROTIME in the last 168 hours. Cardiac Enzymes: No results for input(s): CKTOTAL, CKMB, CKMBINDEX, TROPONINI in the last 168 hours. BNP (last 3 results) No results for input(s): PROBNP in the last 8760 hours. HbA1C: No results for input(s): HGBA1C in the last 72  hours. CBG: Recent Labs  Lab 11/29/20 1941 11/29/20 2348 11/30/20 0308 11/30/20 0802 11/30/20 1111  GLUCAP 188* 170* 151* 168* 195*   Lipid Profile: No results for input(s): CHOL, HDL, LDLCALC, TRIG, CHOLHDL, LDLDIRECT in the last 72 hours. Thyroid Function Tests: No results for input(s): TSH, T4TOTAL, FREET4, T3FREE, THYROIDAB in the last 72 hours. Anemia Panel: No results for input(s): VITAMINB12, FOLATE, FERRITIN, TIBC, IRON, RETICCTPCT in the last 72 hours. Sepsis Labs: No results for input(s): PROCALCITON, LATICACIDVEN in the last 168 hours.  No results found for this or any previous visit (from the past 240 hour(s)).       Radiology Studies: No results found.      Scheduled Meds: . benztropine  1 mg Per NG tube QHS  . levETIRAcetam  500 mg Per Tube BID  . mouth rinse  15 mL Mouth Rinse q12n4p  . sodium chloride flush  10-40 mL Intracatheter Q12H  . sodium chloride flush  10-40 mL Intracatheter Q12H   Continuous Infusions: . sodium chloride 10 mL/hr at 11/18/20 1900     LOS: 24 days    Time spent: 34 minutes spent on chart review, discussion with nursing staff, consultants, updating family and interview/physical exam; more  than 50% of that time was spent in counseling and/or coordination of care.    Alvira Philips [place], DO [facility] Available via Epic secure chat 7am-7pm After these hours, please refer to coverage provider listed on [email or web address] 12/03/2020, 10:26 AM
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
## 2020-12-03 NOTE — Progress Notes (Signed)
Manufacturing systems engineer [facility]) Hospital Liaison note.   Received request from [facility] manager for family interest in [facility] with request for transfer today. Chart reviewed and eligibility confirmed.  Spoke to son [name] to confirm interest and explain services. Family agreeable to transfer today. CSW aware.  Registration paperwork will be completed by our social worker. Dr. Kern Reap to assume care per family request.    RN please call report to [phone].  Please arrange transport for patient once consents complete. This Liaison will let TOC know when ready.   Thank you,   Yolande Jolly, BSN, RN [facility] Liaison (listed on AMION under [facility] [place])   [phone]

## 2020-12-03 NOTE — Plan of Care (Signed)
  Problem: Education: Goal: Knowledge of disease and its progression will improve Outcome: Progressing   

## 2020-12-03 NOTE — Progress Notes (Signed)
Report was given to nurse at [facility]. Awt on family before setting up PTAR

## 2020-12-03 NOTE — Progress Notes (Signed)
PTAR arranged for transport. SW at [facility] has updated the family. Bedside Rn aware.

## 2020-12-03 NOTE — TOC Transition Note (Signed)
Transition of Care [facility]) - CM/SW Discharge Note   Patient Details  Name: Ricky Hanson MRN: [id number] Date of Birth: [birth year]  [facility] CM/SW Contact:  Kermit Balo, RN Phone Number: 12/03/2020, 11:12 AM   Clinical Narrative:    Pt discharging to [facility] today. CM has verified bed at the facility. Pt will transport via PTAR. Bedside RN updated and d/c packet at the desk.  Number for report: [phone]   Final next level of care: Hospice Medical Facility Barriers to Discharge: No Barriers Identified   Patient Goals and CMS Choice Patient states their goals for this hospitalization and ongoing recovery are:: Return to [facility]      Discharge Placement                Patient to be transferred to facility by: PTAR   Patient and family notified of of transfer: 12/03/20  Discharge Plan and Services                                     Social Determinants of Health (SDOH) Interventions     Readmission Risk Interventions No flowsheet data found.

## 2021-01-17 ENCOUNTER — Ambulatory Visit: Payer: Medicare Other | Admitting: Cardiology

## 2021-01-30 ENCOUNTER — Ambulatory Visit: Payer: Medicare Other | Admitting: Podiatry
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
## 2021-05-26 IMAGING — US US ABDOMEN LIMITED
1 series · 14 of 25 positions shown · non-contrast
Comparison: None.

CLINICAL DATA: Right upper quadrant pain.

EXAM:
ULTRASOUND ABDOMEN LIMITED RIGHT UPPER QUADRANT

[Series 1: us abdomen limited ruq (liver/gb) · 14 of 40 slices shown]
[im 1/40]
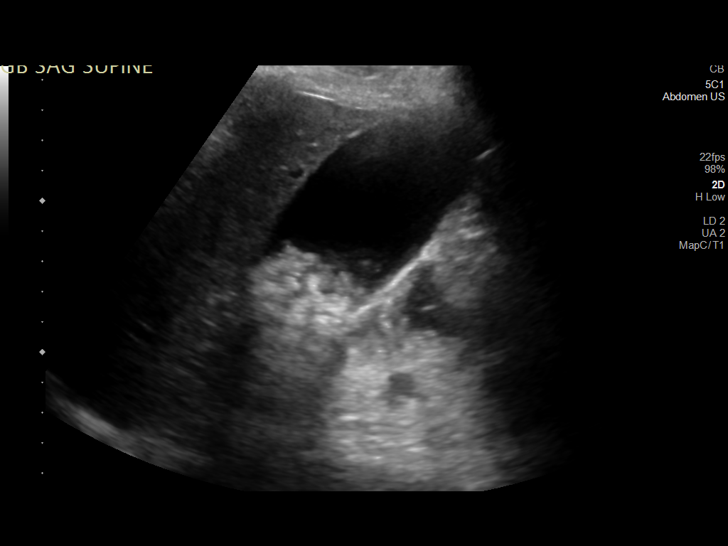
[im 4/40]
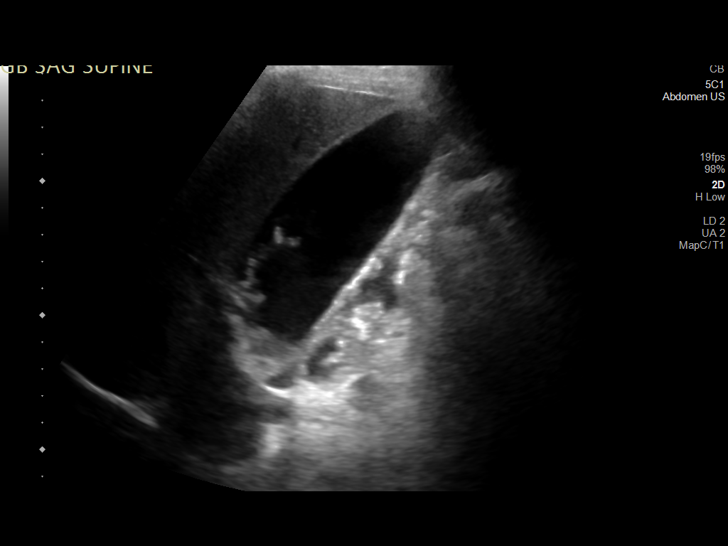
[im 7/40]
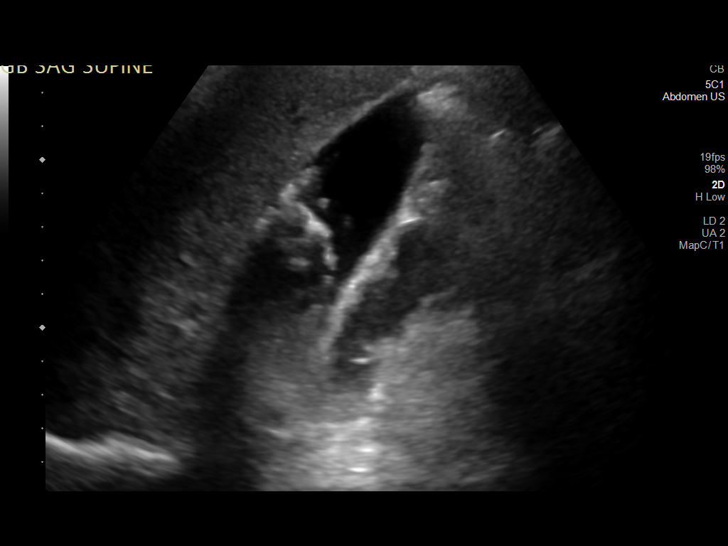
[im 10/40]
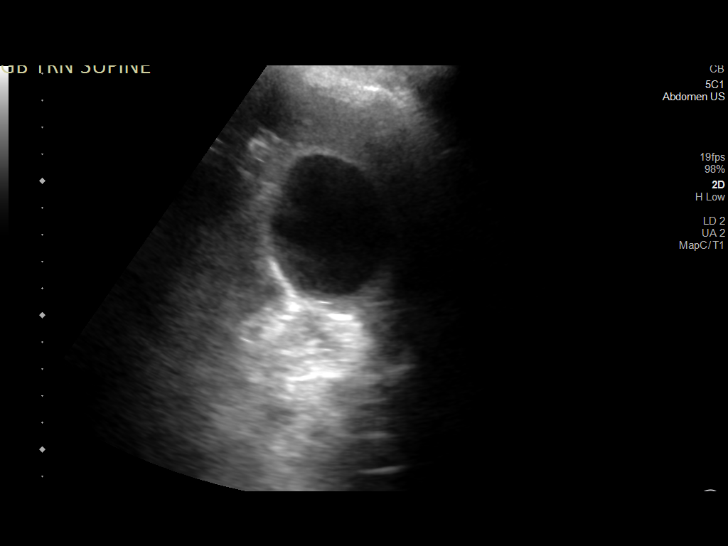
[im 14/40]
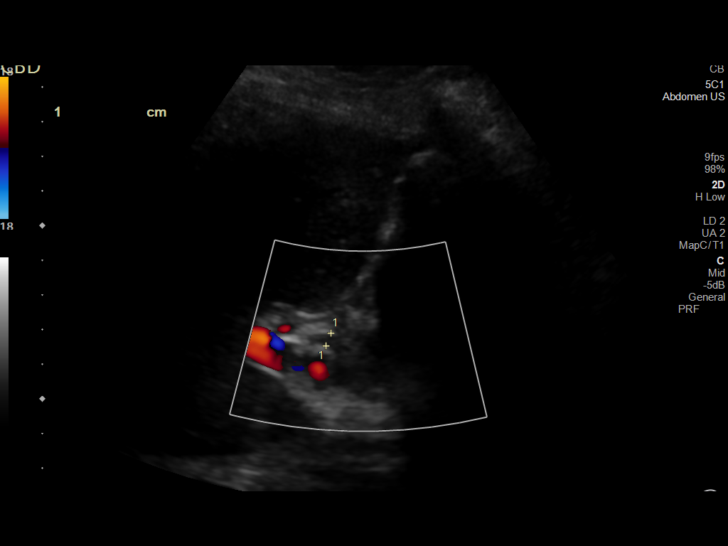
[im 15/40]
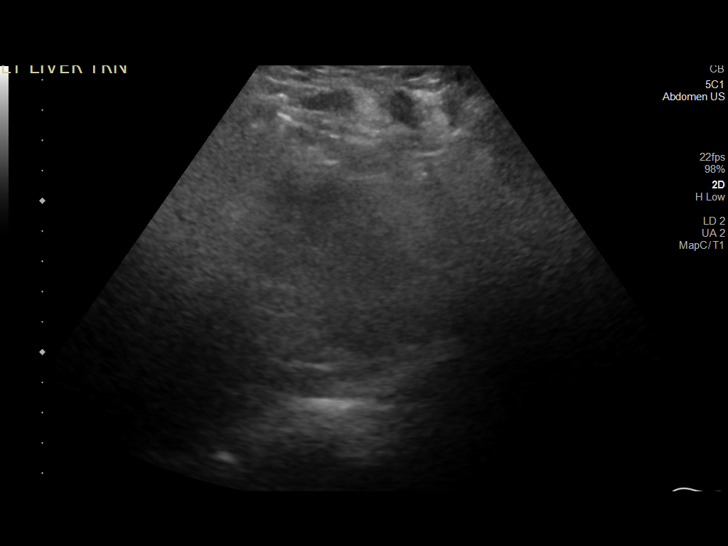
[im 18/40]
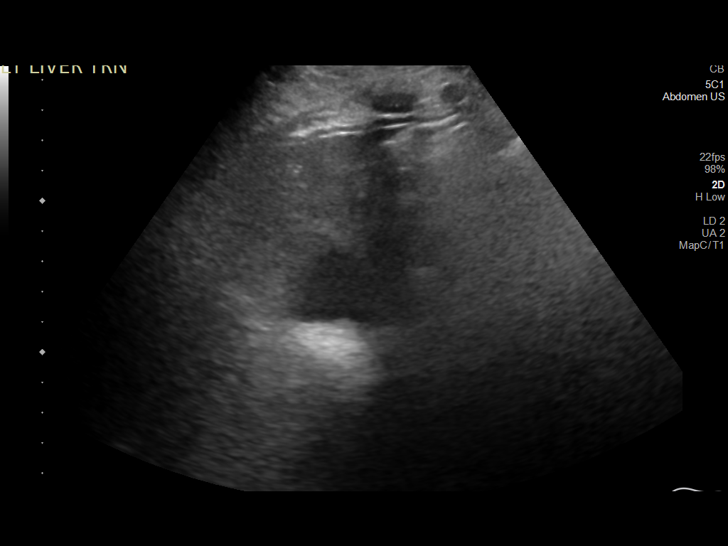
[im 22/40]
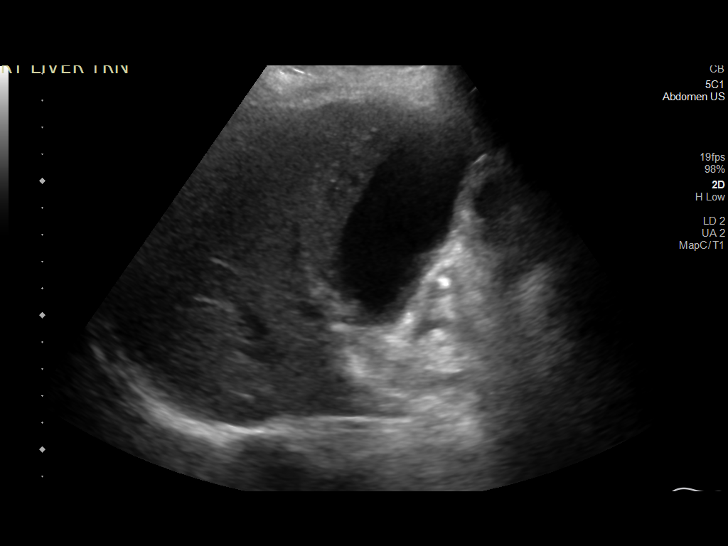
[im 25/40]
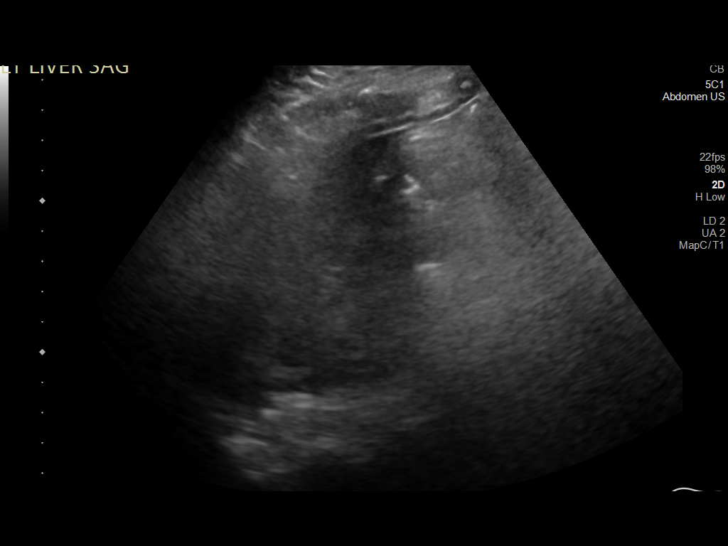
[im 27/40]
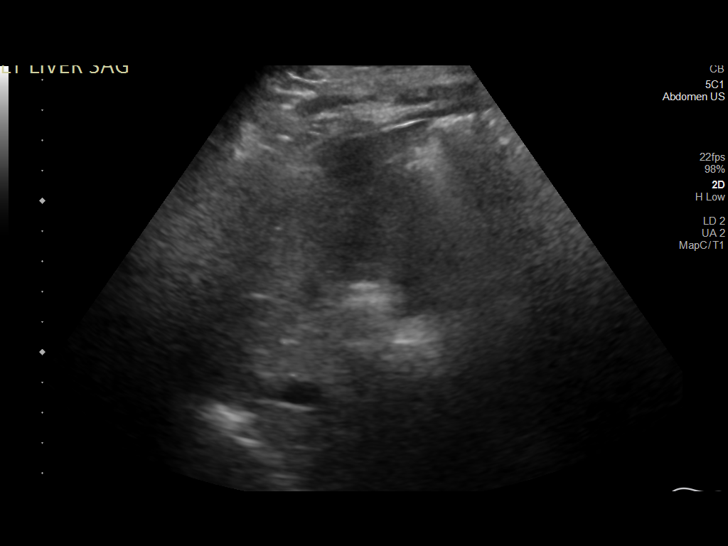
[im 30/40]
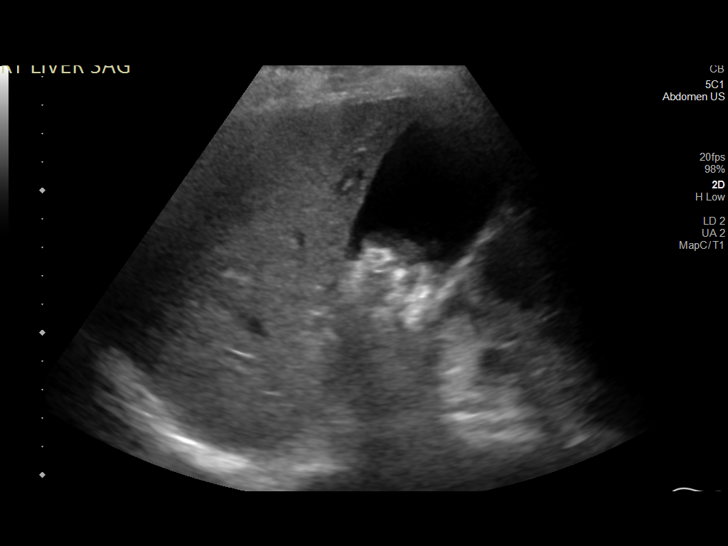
[im 33/40]
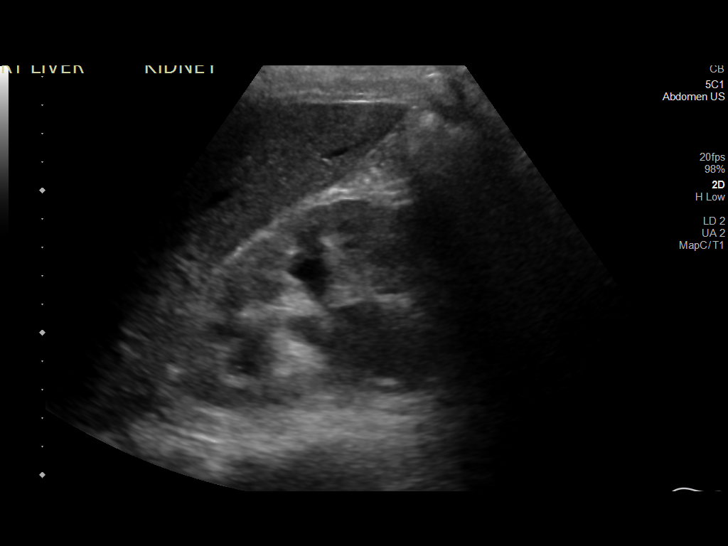
[im 36/40]
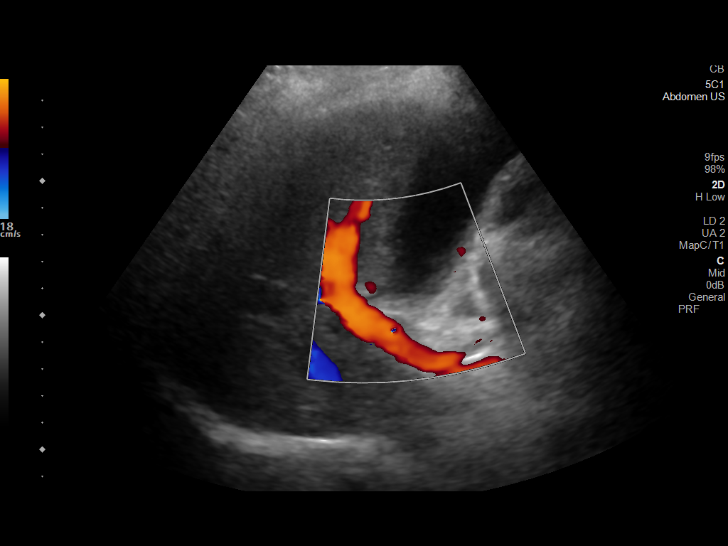
[im 40/40]
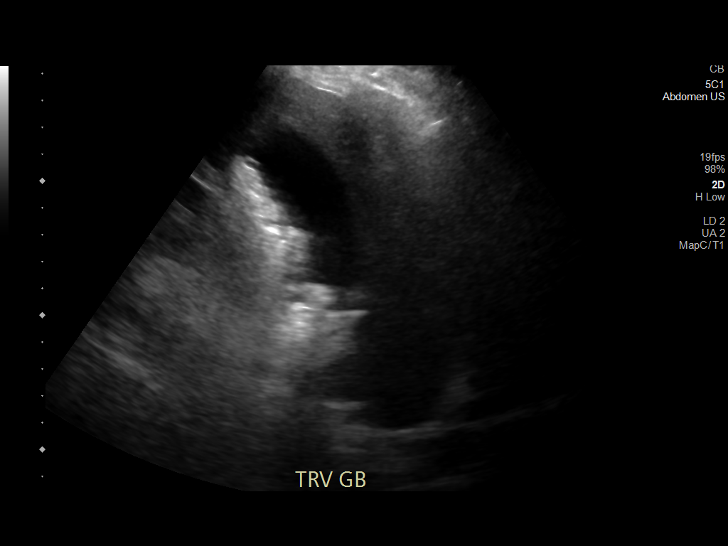

[14 of 25 positions shown; findings below may reference images not displayed]

FINDINGS: Gallbladder:

Numerous small shadowing echogenic gallstones are seen within the
gallbladder lumen. The largest measures approximately 1.3 cm. There
is no evidence of gallbladder wall thickening (1.7 mm). No
sonographic Murphy sign noted by sonographer.

Common bile duct:

Diameter: 4.0 mm

Liver:

No focal lesion identified. Within normal limits in parenchymal
echogenicity. Portal vein is patent on color Doppler imaging with
normal direction of blood flow towards the liver.

Other: It should be noted that the study is limited secondary to
limited ability of the patient to cooperate during the exam.
IMPRESSION: Cholelithiasis without evidence of acute cholecystitis.

## 2021-07-20 ENCOUNTER — Emergency Department (HOSPITAL_COMMUNITY)
Admission: EM | Admit: 2021-07-20 | Discharge: 2021-07-21 | Disposition: A | Discharge status: Home / Self Care | Payer: Medicare (Managed Care) | Attending: Emergency Medicine | Admitting: Emergency Medicine

## 2021-07-20 ENCOUNTER — Encounter (HOSPITAL_COMMUNITY): Payer: Self-pay

## 2021-07-20 DIAGNOSIS — F17210 Nicotine dependence, cigarettes, uncomplicated: Secondary | ICD-10-CM | POA: Diagnosis not present

## 2021-07-20 DIAGNOSIS — E1122 Type 2 diabetes mellitus with diabetic chronic kidney disease: Secondary | ICD-10-CM | POA: Insufficient documentation

## 2021-07-20 DIAGNOSIS — T83098A Other mechanical complication of other indwelling urethral catheter, initial encounter: Secondary | ICD-10-CM | POA: Insufficient documentation

## 2021-07-20 DIAGNOSIS — N183 Chronic kidney disease, stage 3 unspecified: Secondary | ICD-10-CM | POA: Insufficient documentation

## 2021-07-20 DIAGNOSIS — I129 Hypertensive chronic kidney disease with stage 1 through stage 4 chronic kidney disease, or unspecified chronic kidney disease: Secondary | ICD-10-CM | POA: Insufficient documentation

## 2021-07-20 DIAGNOSIS — Z466 Encounter for fitting and adjustment of urinary device: Secondary | ICD-10-CM

## 2021-07-20 MED ORDER — LIDOCAINE HCL URETHRAL/MUCOSAL 2 % EX GEL
1.0000 "application " | Freq: Once | CUTANEOUS | Status: AC
  Administered 2021-07-20: 1 via URETHRAL
  Filled 2021-07-20: qty 11

## 2021-07-20 MED ORDER — LIDOCAINE HCL (CARDIAC) PF 100 MG/5ML IV SOSY
PREFILLED_SYRINGE | INTRAVENOUS | Status: AC
  Filled 2021-07-20: qty 5

## 2021-07-20 NOTE — ED Triage Notes (Signed)
Pt BIB EMS. Pt coming from [facility] nursing home. Nursing home staff stated pt pulled his urinary catheter out. Staff tried to replace it but felt resistance. MD advised staff to send pt out to ED to get cath replaced.

## 2021-07-20 NOTE — ED Provider Notes (Signed)
  Physical Exam  BP (!) 162/87   Pulse 77   Resp 18   SpO2 99%   Physical Exam  ED Course/Procedures     Procedures  MDM  Urology was able to replace this patient's catheter.  Noted that it was a difficult.  Patient is otherwise suitable to return to his home.       Koleen Distance, MD 07/20/21 [phone]

## 2021-07-20 NOTE — Procedures (Addendum)
Procedure Note  Cystoscopy procedure note:  Patient identification was confirmed, informed consent was obtained, and patient was prepped using Betadine solution.  Lidocaine jelly was administered per urethral meatus.    Pre-Procedure: - Inspection reveals a normal caliber ureteral meatus.  Procedure: The flexible cystoscope was introduced without difficulty - Mid urethra notable for significant trauma and likely false passage(s) - Unable to visualize a clear true lumen or pass scope proximally - Passed sensor wire through true lumen and confirmed placement in bladder with 38F open ended catheter w/ return of clear yellow urine - Proceeded to sequentially dilate with Cook urethral dilators to 67F - Successful placement of 18F Council tip catheter over our wire with 10 cc's in the balloon - This was attached to a drainage bag with return of clear yellow urine   Post-Procedure: - Patient tolerated the procedure well  Assessment/ Plan: - Continue Foley catheter - Discharge back to facility from ED - Continue regularly scheduled follow up w/ primary Urologist, Dr. Lawanda Cousins, MD [facility] Urology Resident  I was present and assisted with procedure. Matt R. Sohana Austell MD [facility]  Pager: [phone]

## 2021-07-20 NOTE — ED Provider Notes (Signed)
[facility]-EMERGENCY DEPT Provider Note   CSN: [id number] Arrival date & time: 07/20/21  1402     History Chief Complaint  Patient presents with   Catheter placement    Ricky Hanson is a 70 y.o. male.  70 yo M with a chief complaints of having his Foley removed inadvertently.  This happened earlier today.  Was unable to be replaced at his skilled nursing facility.  He is here to have it placed.  He denies any other issue.  The history is provided by the patient.  Illness Severity:  Moderate Onset quality:  Gradual Duration:  1 day Timing:  Constant Progression:  Unchanged Chronicity:  New Associated symptoms: no abdominal pain, no chest pain, no congestion, no diarrhea, no fever, no headaches, no myalgias, no rash, no shortness of breath and no vomiting       Past Medical History:  Diagnosis Date   Diabetes mellitus without complication (HCC)    Elevated random blood glucose level    Foley catheter in place    Hyperlipidemia    Hypertension    Schizophrenia [facility])     Patient Active Problem List   Diagnosis Date Noted   Cardiac arrest [facility])    AKI (acute kidney injury) (HCC) 11/09/2020   ARF (acute renal failure) (HCC) 11/08/2020   Essential hypertension 11/08/2020   Pain due to onychomycosis of toenail of left foot 06/27/2020   Osteomyelitis (HCC) 05/16/2020   Sepsis (HCC) 05/16/2020   Dry gangrene (HCC) 05/16/2020   Hypertension associated with diabetes (HCC) 05/16/2020   Dyslipidemia 05/16/2020   Peripheral vascular disease (HCC) 05/16/2020   Normocytic anemia 05/16/2020   CKD (chronic kidney disease), stage III (HCC) 05/16/2020   Acute kidney injury (HCC) 05/16/2020   Pain due to onychomycosis of toenails of both feet 03/31/2020   Hammer toes, bilateral 03/31/2020   Traumatic amputation of toe (HCC) 03/31/2020   Chronic indwelling Foley catheter 02/02/2020    Past Surgical History:  Procedure Laterality Date   ABDOMINAL  AORTOGRAM W/LOWER EXTREMITY N/A 05/17/2020   Procedure: ABDOMINAL AORTOGRAM W/LOWER EXTREMITY;  Surgeon: Yates Decamp, MD;  [facility];  Service: Cardiovascular;  Laterality: N/A;   AMPUTATION Right 05/19/2020   Procedure: RIGHT BELOW KNEE AMPUTATION;  Surgeon: Nadara Mustard, MD;  Location: [facility] OR;  Service: Orthopedics;  Laterality: Right;   PERIPHERAL VASCULAR BALLOON ANGIOPLASTY  05/17/2020   Procedure: PERIPHERAL VASCULAR BALLOON ANGIOPLASTY;  Surgeon: Yates Decamp, MD;  [facility];  Service: Cardiovascular;;  Right PT   urologic procedure after trauma         History reviewed. No pertinent family history.  Social History   Tobacco Use   Smoking status: Every Day    Packs/day: 0.50    Types: Cigarettes   Smokeless tobacco: Never  Vaping Use   Vaping Use: Never used  Substance Use Topics   Alcohol use: Never   Drug use: Never    Home Medications Prior to Admission medications   Not on File    Allergies    Patient has no known allergies.  Review of Systems   Review of Systems  Constitutional:  Negative for chills and fever.  HENT:  Negative for congestion and facial swelling.   Eyes:  Negative for discharge and visual disturbance.  Respiratory:  Negative for shortness of breath.   Cardiovascular:  Negative for chest pain and palpitations.  Gastrointestinal:  Negative for abdominal pain, diarrhea and vomiting.  Genitourinary:  Positive for difficulty urinating.  Musculoskeletal:  Negative for arthralgias and myalgias.  Skin:  Negative for color change and rash.  Neurological:  Negative for tremors, syncope and headaches.  Psychiatric/Behavioral:  Negative for confusion and dysphoric mood.    Physical Exam Updated Vital Signs BP 92/77   Pulse 75   Resp 16   SpO2 99%   Physical Exam Vitals and nursing note reviewed.  Constitutional:      Appearance: He is well-developed.  HENT:     Head: Normocephalic and atraumatic.  Eyes:      Pupils: Pupils are equal, round, and reactive to light.  Neck:     Vascular: No JVD.  Cardiovascular:     Rate and Rhythm: Normal rate and regular rhythm.     Heart sounds: No murmur heard.   No friction rub. No gallop.  Pulmonary:     Effort: No respiratory distress.     Breath sounds: No wheezing.  Abdominal:     General: There is no distension.     Tenderness: There is no abdominal tenderness. There is no guarding or rebound.  Genitourinary:    Comments: Uncircumcised Musculoskeletal:        General: Normal range of motion.     Cervical back: Normal range of motion and neck supple.     Comments: Right BKA.  Skin:    Coloration: Skin is not pale.     Findings: No rash.  Neurological:     Mental Status: He is alert and oriented to person, place, and time.  Psychiatric:        Behavior: Behavior normal.    ED Results / Procedures / Treatments   Labs (all labs ordered are listed, but only abnormal results are displayed) Labs Reviewed - No data to display  EKG None  Radiology No results found.  Procedures Procedures   Medications Ordered in ED Medications  lidocaine (XYLOCAINE) 2 % jelly 1 application (has no administration in time range)    ED Course  I have reviewed the triage vital signs and the nursing notes.  Pertinent labs & imaging results that were available during my care of the patient were reviewed by me and considered in my medical decision making (see chart for details).    MDM Rules/Calculators/A&P                           70 yo M with a chief complaints of having his chronic indwelling Foley removed.  They were unable to replace this at the nursing facility.  I tried at bedside with out success.  Hard stop tried with a coud and different sizes.  Discussed with urology who will come and evaluate at bedside.  The patients results and plan were reviewed and discussed.   Any x-rays performed were independently reviewed by myself.    Differential diagnosis were considered with the presenting HPI.  Medications  lidocaine (XYLOCAINE) 2 % jelly 1 application (has no administration in time range)    Vitals:   07/20/21 1515  BP: 92/77  Pulse: 75  Resp: 16  SpO2: 99%    Final diagnoses:  Urinary catheter change required     Final Clinical Impression(s) / ED Diagnoses Final diagnoses:  Urinary catheter change required    Rx / DC Orders ED Discharge Orders     None        Melene Plan, DO 07/20/21 1529
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
## 2021-07-20 NOTE — ED Notes (Signed)
Report called to Ronda at [facility].

## 2021-07-20 NOTE — ED Notes (Signed)
Urology at bedside to place foley.

## 2021-07-20 NOTE — ED Notes (Signed)
[facility] called for transport to [place] 

## 2021-10-22 ENCOUNTER — Emergency Department (HOSPITAL_COMMUNITY)
Admission: EM | Admit: 2021-10-22 | Discharge: 2021-10-22 | Disposition: A | Discharge status: Home / Self Care | Payer: Medicare (Managed Care) | Attending: Emergency Medicine | Admitting: Emergency Medicine

## 2021-10-22 ENCOUNTER — Other Ambulatory Visit: Payer: Self-pay

## 2021-10-22 ENCOUNTER — Encounter (HOSPITAL_COMMUNITY): Payer: Self-pay | Admitting: Emergency Medicine

## 2021-10-22 DIAGNOSIS — E1151 Type 2 diabetes mellitus with diabetic peripheral angiopathy without gangrene: Secondary | ICD-10-CM | POA: Insufficient documentation

## 2021-10-22 DIAGNOSIS — F17210 Nicotine dependence, cigarettes, uncomplicated: Secondary | ICD-10-CM | POA: Insufficient documentation

## 2021-10-22 DIAGNOSIS — I129 Hypertensive chronic kidney disease with stage 1 through stage 4 chronic kidney disease, or unspecified chronic kidney disease: Secondary | ICD-10-CM | POA: Diagnosis not present

## 2021-10-22 DIAGNOSIS — Z466 Encounter for fitting and adjustment of urinary device: Secondary | ICD-10-CM

## 2021-10-22 DIAGNOSIS — N183 Chronic kidney disease, stage 3 unspecified: Secondary | ICD-10-CM | POA: Diagnosis not present

## 2021-10-22 DIAGNOSIS — E1122 Type 2 diabetes mellitus with diabetic chronic kidney disease: Secondary | ICD-10-CM | POA: Diagnosis not present

## 2021-10-22 DIAGNOSIS — Y732 Prosthetic and other implants, materials and accessory gastroenterology and urology devices associated with adverse incidents: Secondary | ICD-10-CM | POA: Insufficient documentation

## 2021-10-22 DIAGNOSIS — T83098A Other mechanical complication of other indwelling urethral catheter, initial encounter: Secondary | ICD-10-CM | POA: Insufficient documentation

## 2021-10-22 MED ORDER — LIDOCAINE HCL URETHRAL/MUCOSAL 2 % EX GEL: 1.0000 "application " | Freq: Once | CUTANEOUS | Status: DC | PRN

## 2021-10-22 MED ORDER — LIDOCAINE HCL URETHRAL/MUCOSAL 2 % EX GEL
1.0000 "application " | Freq: Once | CUTANEOUS | Status: AC
  Administered 2021-10-22: 1 via URETHRAL
  Filled 2021-10-22: qty 11

## 2021-10-22 NOTE — ED Notes (Signed)
This RN and the attending MD attempted coude catheter placement unsuccessfully.

## 2021-10-22 NOTE — ED Notes (Signed)
Urology at bedside.

## 2021-10-22 NOTE — ED Notes (Signed)
[facility] contacted for transport 

## 2021-10-22 NOTE — ED Triage Notes (Signed)
Patient arrives from [place] after his catheter came out. Unsure of how catheter came out. Patient endorses some lower abdominal pain, but states that he has chronic abdominal pain. Patient is a chronic catheter user. Per EMS, no blood noted at meatus.

## 2021-10-22 NOTE — ED Provider Notes (Signed)
[facility]  HOSPITAL-EMERGENCY DEPT Provider Note   CSN: [id number] Arrival date & time: 10/22/21  0128     History Chief Complaint  Patient presents with   Catheter Issue    Ricky Hanson is a 70 y.o. male.  70 year old male who has a fully catheter secondary to urethral stricture the presents emerged from today with a dislodged catheter.  Patient states that the tech was changing his depends when the catheter got pulled out.  This happened sometime last 24 hours but is not sure when.  He has significant pain because of this       Past Medical History:  Diagnosis Date   Diabetes mellitus without complication (HCC)    Elevated random blood glucose level    Foley catheter in place    Hyperlipidemia    Hypertension    Schizophrenia [facility])     Patient Active Problem List   Diagnosis Date Noted   Cardiac arrest [facility])    AKI (acute kidney injury) (HCC) 11/09/2020   ARF (acute renal failure) (HCC) 11/08/2020   Essential hypertension 11/08/2020   Pain due to onychomycosis of toenail of left foot 06/27/2020   Osteomyelitis (HCC) 05/16/2020   Sepsis (HCC) 05/16/2020   Dry gangrene (HCC) 05/16/2020   Hypertension associated with diabetes (HCC) 05/16/2020   Dyslipidemia 05/16/2020   Peripheral vascular disease (HCC) 05/16/2020   Normocytic anemia 05/16/2020   CKD (chronic kidney disease), stage III (HCC) 05/16/2020   Acute kidney injury (HCC) 05/16/2020   Pain due to onychomycosis of toenails of both feet 03/31/2020   Hammer toes, bilateral 03/31/2020   Traumatic amputation of toe (HCC) 03/31/2020   Chronic indwelling Foley catheter 02/02/2020    Past Surgical History:  Procedure Laterality Date   ABDOMINAL AORTOGRAM W/LOWER EXTREMITY N/A 05/17/2020   Procedure: ABDOMINAL AORTOGRAM W/LOWER EXTREMITY;  Surgeon: Yates Decamp, MD;  [facility];  Service: Cardiovascular;  Laterality: N/A;   AMPUTATION Right 05/19/2020   Procedure: RIGHT BELOW  KNEE AMPUTATION;  Surgeon: Nadara Mustard, MD;  Location: [facility] OR;  Service: Orthopedics;  Laterality: Right;   PERIPHERAL VASCULAR BALLOON ANGIOPLASTY  05/17/2020   Procedure: PERIPHERAL VASCULAR BALLOON ANGIOPLASTY;  Surgeon: Yates Decamp, MD;  [facility];  Service: Cardiovascular;;  Right PT   urologic procedure after trauma         No family history on file.  Social History   Tobacco Use   Smoking status: Every Day    Packs/day: 0.50    Types: Cigarettes   Smokeless tobacco: Never  Vaping Use   Vaping Use: Never used  Substance Use Topics   Alcohol use: Never   Drug use: Never    Home Medications Prior to Admission medications   Not on File    Allergies    Patient has no known allergies.  Review of Systems   Review of Systems  All other systems reviewed and are negative.  Physical Exam Updated Vital Signs BP (!) 152/70   Pulse 82   Temp 97.6 F (36.4 C) (Oral)   Resp 20   SpO2 100%   Physical Exam Vitals and nursing note reviewed.  Constitutional:      Appearance: He is well-developed.  HENT:     Head: Normocephalic and atraumatic.     Mouth/Throat:     Mouth: Mucous membranes are moist.     Pharynx: Oropharynx is clear.  Eyes:     Pupils: Pupils are equal, round, and reactive  to light.  Cardiovascular:     Rate and Rhythm: Normal rate.  Pulmonary:     Effort: Pulmonary effort is normal. No respiratory distress.  Abdominal:     General: There is no distension.     Tenderness: There is abdominal tenderness (Along with distention of the lower abdomen).  Musculoskeletal:        General: Normal range of motion.     Cervical back: Normal range of motion.  Skin:    General: Skin is warm and dry.  Neurological:     General: No focal deficit present.     Mental Status: He is alert.    ED Results / Procedures / Treatments   Labs (all labs ordered are listed, but only abnormal results are displayed) Labs Reviewed - No data to  display  EKG None  Radiology No results found.  Procedures BLADDER CATHETERIZATION  Date/Time: 10/22/2021 8:06 AM Performed by: Merrily Pew, MD Authorized by: Merrily Pew, MD   Consent:    Consent obtained:  Verbal   Consent given by:  Patient   Risks, benefits, and alternatives were discussed: yes     Risks discussed:  False passage, urethral injury, infection, pain and incomplete procedure   Alternatives discussed:  No treatment, delayed treatment, alternative treatment, referral and observation Universal protocol:    Procedure explained and questions answered to patient or proxy's satisfaction: yes     Patient identity confirmed:  Verbally with patient Pre-procedure details:    Procedure purpose:  Therapeutic   Preparation: Patient was prepped and draped in usual sterile fashion   Anesthesia:    Anesthesia method:  Topical application   Topical anesthetic:  Lidocaine gel Procedure details:    Provider performed due to:  Complicated insertion and nurse unable to complete   Catheter insertion:  Indwelling   Catheter type:  Coude   Catheter size:  18 Fr   Bladder irrigation: no     Number of attempts:  3 Post-procedure details:    Procedure completion:  Tolerated Comments:     Was not able to place catheter. No obvious bleeding, false tract or other complications noted.    Medications Ordered in ED Medications  lidocaine (XYLOCAINE) 2 % jelly 1 application (1 application Urethral Given 10/22/21 0221)    ED Course  I have reviewed the triage vital signs and the nursing notes.  Pertinent labs & imaging results that were available during my care of the patient were reviewed by me and considered in my medical decision making (see chart for details).    MDM Rules/Calculators/A&P                         Multiple nurses attempted to get the catheter placed and it could not be done.  I attempted with a coud and it with a small red rubber catheter neither of which  would fit.  I discussed with urology who was able to perform cystoscopy to pass a wire and then did serial dilations and ultimately was able to place a Foley catheter.  Patient will be discharged by the facility with follow-up with urology as needed  Final Clinical Impression(s) / ED Diagnoses Final diagnoses:  Encounter for Foley catheter replacement    Rx / DC Orders ED Discharge Orders     None        [place], Corene Cornea, MD 10/22/21 [phone]

## 2021-10-22 NOTE — ED Notes (Signed)
Attempted to insert a straight tip and a coude catheter without success. Unable to pass past patient's prostate. Patient noted to be very uncomfortable during the procedure.

## 2021-10-22 NOTE — Procedures (Signed)
Procedure Note   Cystoscopy procedure note:   Patient identification was confirmed, informed consent was obtained, and patient was prepped using Betadine solution.  Lidocaine jelly was administered per urethral meatus.     Pre-Procedure: - Inspection reveals a normal caliber ureteral meatus.   Procedure: The flexible cystoscope was introduced without difficulty - Mid penile urethra notable for significant trauma and likely false passage - Unable to visualize a clear true lumen or pass scope proximally - I was able to pass a sensor wire through true lumen and confirmed placement in bladder with 52F open ended catheter w/ return of clear yellow urine - Proceeded to sequentially dilate with Cook urethral dilators to 22F - Successful placement of 27F Council tip catheter over our wire with 10 cc's in the balloon - This was attached to a drainage bag with return of clear yellow urine   Post-Procedure: - Patient tolerated the procedure well   Assessment/ Plan: - Continue Foley catheter - Discharge back to facility from ED - I will arrange for followup with Dr. Arita Miss to discuss suprapubic tube   Matt R. Christmas Faraci MD [facility]  Pager: [phone]
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
## 2022-05-29 NOTE — Pre-Procedure Instructions (Addendum)
Ricky Hanson  05/29/2022       Surgical Instructions   Your procedure is scheduled on Fri., May 31, 2022 from 7:15AM-8:19AM.  Report to [facility] Main Entrance "A" at 5:30 A.M., then check in with the Admitting office.  Call this number if you have problems the morning of surgery:  [phone]   Remember:  Do not eat or drink after midnight on July 6th    Take these medicines the morning of surgery with A SIP OF WATER: Please provide exact times the medications were given.  LORazepam (ATIVAN)  As of today, STOP taking any Aspirin (unless otherwise instructed by your surgeon) Aleve, Naproxen, Ibuprofen, Motrin, Advil, Goody's, BC's, all herbal medications, fish oil, and all vitamins.  How do I manage my blood sugar before surgery? Check your blood sugar the morning of your surgery when you wake up and every 2 hours until you get to the Short Stay unit. If your blood sugar is less than 70 mg/dL, you will need to treat for low blood sugar: Do not take insulin. Treat a low blood sugar (less than 70 mg/dL) with  cup of clear juice (cranberry or apple), 4 glucose tablets, OR glucose gel. Recheck blood sugar in 15 minutes after treatment (to make sure it is greater than 70 mg/dL). If your blood sugar is not greater than 70 mg/dL on recheck, call [phone]  for further instructions. Report your blood sugar to the short stay nurse when you get to Short Stay.  If your CBG is greater than 220 mg/dL, inform the staff upon arrival to Short Stay.  Reviewed and Endorsed by [facility] Patient Education Committee, August 2015             Day of Surgery: Do not wear jewelry. Do not wear lotions, powders, perfumes/colognes, or deodorant. Do not shave 48 hours prior to surgery.  Men may shave face and neck. Do not bring valuables to the hospital.             [facility] is not responsible for any belongings or valuables.  Do NOT Smoke (Tobacco/Vaping)  24 hours prior to your  procedure  If you use a CPAP at night, you may bring your mask and machine for your overnight stay.   Contacts, glasses, hearing aids, dentures or partials may not be worn into surgery, please bring cases for these belongings   For patients admitted to the hospital, discharge time will be determined by your treatment team.   Patients discharged the day of surgery will not be allowed to drive home, and someone needs to stay with them for 24 hours.  NO VISITORS WILL BE ALLOWED IN PRE-OP WHERE PATIENTS ARE PREPPED FOR SURGERY.    SURGICAL WAITING ROOM VISITATION Patients having surgery or a procedure in a hospital may have two support people. Children under the age of 49 must have an adult with them who is not the patient. They may stay in the waiting area during the procedure and may switch out with other visitors. If the patient needs to stay at the hospital during part of their recovery, the visitor guidelines for inpatient rooms apply.  Please refer to the [facility] website for the visitor guidelines for Inpatients (after your surgery is over and you are in a regular room).   Special instructions:    Oral Hygiene is also important to reduce your risk of infection.  Remember - BRUSH YOUR TEETH THE MORNING OF SURGERY WITH YOUR  REGULAR TOOTHPASTE   [facility]- Preparing For Surgery  Before surgery, you can play an important role. Because skin is not sterile, your skin needs to be as free of germs as possible. You can reduce the number of germs on your skin by washing with an Antibacterial Soap before surgery.   Please follow these instructions carefully.    Shower the NIGHT BEFORE SURGERY and the MORNING OF SURGERY with an Antibacterial Soap   Wash thoroughly, paying special attention to the area where your surgery will be performed.  Pat yourself dry with a CLEAN TOWEL.  Wear CLEAN PAJAMAS to bed the night before surgery  Place CLEAN SHEETS on your bed the night before your  surgery  DO NOT SLEEP WITH PETS.  Reminder: Take a shower with an Antibacterial Soap Wear Clean/Comfortable clothing the morning of surgery Do not apply any deodorants/lotions.   Remember to brush your teeth WITH YOUR REGULAR TOOTHPASTE.   If you have been in contact with anyone that has tested positive in the last 10 days please notify you surgeon.  Notify your provider:  if you develop a fever of 100.4 or greater, sneezing, cough, sore throat, shortness of breath or body aches.  Please read over the following fact sheets that you were given.

## 2022-05-29 NOTE — Progress Notes (Signed)
This pt currently resides at [facility] [phone], fx [phone]. Olivia Mackie faxed the pre-op instructions. Awaiting confirmation of receipt.

## 2022-05-30 SURGERY — Surgical Case
Anesthesia: *Unknown

## 2022-05-30 NOTE — Anesthesia Preprocedure Evaluation (Signed)
Anesthesia Evaluation   Patient awake    Reviewed: Allergy & Precautions, Patient's Chart, lab work & pertinent test results  History of Anesthesia Complications Negative for: history of anesthetic complications  Airway Mallampati: II  TM Distance: >3 FB Neck ROM: Full    Dental  (+) Poor Dentition   Pulmonary Current Smoker,    Pulmonary exam normal        Cardiovascular hypertension, Pt. 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  Reproductive/Obstetrics negative OB ROS                             Anesthesia Physical  Anesthesia Plan  ASA: 3  Anesthesia Plan: General   Post-op Pain Management: Tylenol PO (pre-op)* and Celebrex PO (pre-op)*   Induction: Intravenous  PONV Risk Score and Plan: 2 and Treatment may vary due to age or medical condition, Ondansetron and Dexamethasone  Airway Management Planned: Oral ETT and Nasal ETT  Additional Equipment: None  Intra-op Plan:   Post-operative Plan: Extubation in OR  Informed Consent: I have reviewed the patients History and Physical, chart, labs and discussed the procedure including the risks, benefits and alternatives for the proposed anesthesia with the patient or authorized representative who has indicated his/her understanding and acceptance.     Dental advisory given  Plan Discussed with: CRNA and Anesthesiologist  Anesthesia Plan Comments:          Anesthesia Quick Evaluation

## 2022-05-30 NOTE — Progress Notes (Signed)
Received call from [facility] stating they have not heard from [facility] for instructions. ([phone]

## 2022-05-31 ENCOUNTER — Ambulatory Visit (HOSPITAL_BASED_OUTPATIENT_CLINIC_OR_DEPARTMENT_OTHER): Payer: Medicare (Managed Care) | Admitting: Anesthesiology

## 2022-05-31 ENCOUNTER — Other Ambulatory Visit: Payer: Self-pay

## 2022-05-31 ENCOUNTER — Ambulatory Visit (HOSPITAL_COMMUNITY): Payer: Medicare (Managed Care) | Admitting: Anesthesiology

## 2022-05-31 ENCOUNTER — Ambulatory Visit (HOSPITAL_COMMUNITY)
Admission: RE | Admit: 2022-05-31 | Discharge: 2022-05-31 | Disposition: A | Discharge status: Home / Self Care | Payer: Medicare (Managed Care) | Source: Ambulatory Visit | Attending: Oral Surgery | Admitting: Oral Surgery

## 2022-05-31 ENCOUNTER — Encounter (HOSPITAL_COMMUNITY): Payer: Self-pay | Admitting: Oral Surgery

## 2022-05-31 ENCOUNTER — Encounter (HOSPITAL_COMMUNITY)
Admission: RE | Disposition: A | Discharge status: Home / Self Care | Payer: Self-pay | Source: Ambulatory Visit | Attending: Oral Surgery

## 2022-05-31 DIAGNOSIS — Z79899 Other long term (current) drug therapy: Secondary | ICD-10-CM | POA: Diagnosis not present

## 2022-05-31 DIAGNOSIS — K0889 Other specified disorders of teeth and supporting structures: Secondary | ICD-10-CM | POA: Diagnosis not present

## 2022-05-31 DIAGNOSIS — I493 Ventricular premature depolarization: Secondary | ICD-10-CM | POA: Diagnosis not present

## 2022-05-31 DIAGNOSIS — E1122 Type 2 diabetes mellitus with diabetic chronic kidney disease: Secondary | ICD-10-CM | POA: Diagnosis not present

## 2022-05-31 DIAGNOSIS — Z993 Dependence on wheelchair: Secondary | ICD-10-CM | POA: Diagnosis not present

## 2022-05-31 DIAGNOSIS — M278 Other specified diseases of jaws: Secondary | ICD-10-CM

## 2022-05-31 DIAGNOSIS — Z7984 Long term (current) use of oral hypoglycemic drugs: Secondary | ICD-10-CM | POA: Diagnosis not present

## 2022-05-31 DIAGNOSIS — F17200 Nicotine dependence, unspecified, uncomplicated: Secondary | ICD-10-CM | POA: Diagnosis not present

## 2022-05-31 DIAGNOSIS — I129 Hypertensive chronic kidney disease with stage 1 through stage 4 chronic kidney disease, or unspecified chronic kidney disease: Secondary | ICD-10-CM | POA: Insufficient documentation

## 2022-05-31 DIAGNOSIS — K029 Dental caries, unspecified: Secondary | ICD-10-CM | POA: Diagnosis present

## 2022-05-31 DIAGNOSIS — K056 Periodontal disease, unspecified: Secondary | ICD-10-CM | POA: Diagnosis not present

## 2022-05-31 DIAGNOSIS — N189 Chronic kidney disease, unspecified: Secondary | ICD-10-CM | POA: Insufficient documentation

## 2022-05-31 HISTORY — PX: TOOTH EXTRACTION: SHX859

## 2022-05-31 LAB — GLUCOSE, CAPILLARY
Glucose-Capillary: 131 mg/dL — ABNORMAL HIGH (ref 70–99)
Glucose-Capillary: 201 mg/dL — ABNORMAL HIGH (ref 70–99)

## 2022-05-31 LAB — BASIC METABOLIC PANEL
Anion gap: 8 (ref 5–15)
BUN: 13 mg/dL (ref 8–23)
CO2: 25 mmol/L (ref 22–32)
Calcium: 8.4 mg/dL — ABNORMAL LOW (ref 8.9–10.3)
Chloride: 105 mmol/L (ref 98–111)
Creatinine, Ser: 1.37 mg/dL — ABNORMAL HIGH (ref 0.61–1.24)
GFR, Estimated: 56 mL/min — ABNORMAL LOW (ref >60)
Glucose, Bld: 124 mg/dL — ABNORMAL HIGH (ref 70–99)
Potassium: 4.1 mmol/L (ref 3.5–5.1)
Sodium: 138 mmol/L (ref 135–145)

## 2022-05-31 LAB — CBC
HCT: 35.1 % — ABNORMAL LOW (ref 39.0–52.0)
Hemoglobin: 11.5 g/dL — ABNORMAL LOW (ref 13.0–17.0)
MCH: 27.8 pg (ref 26.0–34.0)
MCHC: 32.8 g/dL (ref 30.0–36.0)
MCV: 85 fL (ref 80.0–100.0)
Platelets: 200 10*3/uL (ref 150–400)
RBC: 4.13 MIL/uL — ABNORMAL LOW (ref 4.22–5.81)
RDW: 13.8 % (ref 11.5–15.5)
WBC: 6.1 10*3/uL (ref 4.0–10.5)
nRBC: 0 % (ref 0.0–0.2)

## 2022-05-31 SURGERY — DENTAL RESTORATION/EXTRACTIONS
Anesthesia: General

## 2022-05-31 MED ORDER — PROPOFOL 10 MG/ML IV BOLUS
INTRAVENOUS | Status: AC
  Filled 2022-05-31: qty 20

## 2022-05-31 MED ORDER — NICOTINE 14 MG/24HR TD PT24: 14.0000 mg | MEDICATED_PATCH | Freq: Every day | TRANSDERMAL | 0 refills | Status: AC

## 2022-05-31 MED ORDER — MEPERIDINE HCL 25 MG/ML IJ SOLN: 6.2500 mg | INTRAMUSCULAR | Status: DC | PRN

## 2022-05-31 MED ORDER — OXYCODONE-ACETAMINOPHEN 5-325 MG PO TABS: 1.0000 | ORAL_TABLET | ORAL | 0 refills | Status: AC | PRN

## 2022-05-31 MED ORDER — MIDAZOLAM HCL 2 MG/2ML IJ SOLN
INTRAMUSCULAR | Status: AC
  Filled 2022-05-31: qty 2

## 2022-05-31 MED ORDER — LIDOCAINE-EPINEPHRINE 2 %-1:100000 IJ SOLN
INTRAMUSCULAR | Status: DC | PRN
  Administered 2022-05-31: 20 mL

## 2022-05-31 MED ORDER — OXYMETAZOLINE HCL 0.05 % NA SOLN
NASAL | Status: AC
  Filled 2022-05-31: qty 30

## 2022-05-31 MED ORDER — ROCURONIUM BROMIDE 10 MG/ML (PF) SYRINGE
PREFILLED_SYRINGE | INTRAVENOUS | Status: DC | PRN
  Administered 2022-05-31: 50 mg via INTRAVENOUS

## 2022-05-31 MED ORDER — OXYCODONE HCL 5 MG/5ML PO SOLN: 5.0000 mg | Freq: Once | ORAL | Status: DC | PRN

## 2022-05-31 MED ORDER — ACETAMINOPHEN 160 MG/5ML PO SOLN: 325.0000 mg | ORAL | Status: DC | PRN

## 2022-05-31 MED ORDER — CHLORHEXIDINE GLUCONATE 0.12 % MT SOLN: 15.0000 mL | Freq: Once | OROMUCOSAL | Status: AC

## 2022-05-31 MED ORDER — LIDOCAINE 2% (20 MG/ML) 5 ML SYRINGE
INTRAMUSCULAR | Status: DC | PRN
  Administered 2022-05-31: 100 mg via INTRAVENOUS

## 2022-05-31 MED ORDER — SUGAMMADEX SODIUM 200 MG/2ML IV SOLN
INTRAVENOUS | Status: DC | PRN
  Administered 2022-05-31 (×2): 200 mg via INTRAVENOUS

## 2022-05-31 MED ORDER — ROCURONIUM BROMIDE 10 MG/ML (PF) SYRINGE
PREFILLED_SYRINGE | INTRAVENOUS | Status: AC
  Filled 2022-05-31: qty 10

## 2022-05-31 MED ORDER — ACETAMINOPHEN 10 MG/ML IV SOLN
INTRAVENOUS | Status: AC
  Filled 2022-05-31: qty 100

## 2022-05-31 MED ORDER — INSULIN ASPART 100 UNIT/ML IJ SOLN: 0.0000 [IU] | INTRAMUSCULAR | Status: DC | PRN

## 2022-05-31 MED ORDER — ONDANSETRON HCL 4 MG/2ML IJ SOLN
INTRAMUSCULAR | Status: DC | PRN
  Administered 2022-05-31: 4 mg via INTRAVENOUS

## 2022-05-31 MED ORDER — DEXAMETHASONE SODIUM PHOSPHATE 10 MG/ML IJ SOLN
INTRAMUSCULAR | Status: AC
  Filled 2022-05-31: qty 1

## 2022-05-31 MED ORDER — LIDOCAINE 2% (20 MG/ML) 5 ML SYRINGE
INTRAMUSCULAR | Status: AC
  Filled 2022-05-31: qty 5

## 2022-05-31 MED ORDER — ONDANSETRON HCL 4 MG/2ML IJ SOLN: 4.0000 mg | Freq: Once | INTRAMUSCULAR | Status: DC | PRN

## 2022-05-31 MED ORDER — ACETAMINOPHEN 325 MG PO TABS: 325.0000 mg | ORAL_TABLET | ORAL | Status: DC | PRN

## 2022-05-31 MED ORDER — PROPOFOL 10 MG/ML IV BOLUS
INTRAVENOUS | Status: DC | PRN
  Administered 2022-05-31: 50 mg via INTRAVENOUS
  Administered 2022-05-31: 200 mg via INTRAVENOUS

## 2022-05-31 MED ORDER — OXYMETAZOLINE HCL 0.05 % NA SOLN
NASAL | Status: DC | PRN
  Administered 2022-05-31 (×2): 2 via NASAL

## 2022-05-31 MED ORDER — SODIUM CHLORIDE 0.9 % IR SOLN
Status: DC | PRN
  Administered 2022-05-31: 1000 mL

## 2022-05-31 MED ORDER — DEXAMETHASONE SODIUM PHOSPHATE 10 MG/ML IJ SOLN
INTRAMUSCULAR | Status: DC | PRN
  Administered 2022-05-31: 10 mg via INTRAVENOUS

## 2022-05-31 MED ORDER — FENTANYL CITRATE (PF) 250 MCG/5ML IJ SOLN
INTRAMUSCULAR | Status: DC | PRN
  Administered 2022-05-31 (×2): 50 ug via INTRAVENOUS

## 2022-05-31 MED ORDER — AMOXICILLIN 500 MG PO CAPS: 500.0000 mg | ORAL_CAPSULE | Freq: Three times a day (TID) | ORAL | 0 refills | Status: AC

## 2022-05-31 MED ORDER — FENTANYL CITRATE (PF) 250 MCG/5ML IJ SOLN
INTRAMUSCULAR | Status: AC
  Filled 2022-05-31: qty 5

## 2022-05-31 MED ORDER — OXYCODONE HCL 5 MG PO TABS: 5.0000 mg | ORAL_TABLET | Freq: Once | ORAL | Status: DC | PRN

## 2022-05-31 MED ORDER — ORAL CARE MOUTH RINSE: 15.0000 mL | Freq: Once | OROMUCOSAL | Status: AC

## 2022-05-31 MED ORDER — CEFAZOLIN SODIUM-DEXTROSE 2-4 GM/100ML-% IV SOLN
2.0000 g | INTRAVENOUS | Status: AC
  Administered 2022-05-31: 2 g via INTRAVENOUS
  Filled 2022-05-31: qty 100

## 2022-05-31 MED ORDER — LACTATED RINGERS IV SOLN
INTRAVENOUS | Status: DC
Start: 2022-05-31 — End: 2022-05-31

## 2022-05-31 MED ORDER — ONDANSETRON HCL 4 MG/2ML IJ SOLN
INTRAMUSCULAR | Status: AC
  Filled 2022-05-31: qty 2

## 2022-05-31 MED ORDER — FENTANYL CITRATE (PF) 100 MCG/2ML IJ SOLN: 25.0000 ug | INTRAMUSCULAR | Status: DC | PRN

## 2022-05-31 MED ORDER — LIDOCAINE-EPINEPHRINE 2 %-1:100000 IJ SOLN
INTRAMUSCULAR | Status: AC
  Filled 2022-05-31: qty 1

## 2022-05-31 MED ORDER — CHLORHEXIDINE GLUCONATE 0.12 % MT SOLN
OROMUCOSAL | Status: AC
  Administered 2022-05-31: 15 mL via OROMUCOSAL
  Filled 2022-05-31: qty 15

## 2022-05-31 SURGICAL SUPPLY — 38 items
BLADE SURG 15 STRL LF DISP TIS (BLADE) ×1 IMPLANT
BLADE SURG 15 STRL SS (BLADE) ×1
BNDG COHESIVE 2X5 TAN ST LF (GAUZE/BANDAGES/DRESSINGS) IMPLANT
BNDG EYE OVAL (GAUZE/BANDAGES/DRESSINGS) IMPLANT
BUR CROSS CUT FISSURE 1.6 (BURR) ×2 IMPLANT
BUR EGG ELITE 4.0 (BURR) IMPLANT
CANISTER SUCT 1200ML W/VALVE (MISCELLANEOUS) ×2 IMPLANT
CATH ROBINSON RED A/P 10FR (CATHETERS) IMPLANT
COVER BACK TABLE 60X90IN (DRAPES) ×2 IMPLANT
COVER MAYO STAND STRL (DRAPES) ×2 IMPLANT
DRAPE U-SHAPE 76X120 STRL (DRAPES) ×2 IMPLANT
GLOVE BIO SURGEON STRL SZ 6.5 (GLOVE) IMPLANT
GLOVE BIO SURGEON STRL SZ8 (GLOVE) ×2 IMPLANT
GLOVE BIOGEL PI IND STRL 6.5 (GLOVE) IMPLANT
GLOVE BIOGEL PI INDICATOR 6.5 (GLOVE)
GOWN STRL REUS W/ TWL LRG LVL3 (GOWN DISPOSABLE) ×1 IMPLANT
GOWN STRL REUS W/ TWL XL LVL3 (GOWN DISPOSABLE) ×1 IMPLANT
GOWN STRL REUS W/TWL LRG LVL3 (GOWN DISPOSABLE) ×1
GOWN STRL REUS W/TWL XL LVL3 (GOWN DISPOSABLE) ×1
IV NS 500ML (IV SOLUTION) ×1
IV NS 500ML BAXH (IV SOLUTION) ×1 IMPLANT
NEEDLE HYPO 22GX1.5 SAFETY (NEEDLE) ×2 IMPLANT
NS IRRIG 1000ML POUR BTL (IV SOLUTION) ×2 IMPLANT
PACK BASIN DAY SURGERY FS (CUSTOM PROCEDURE TRAY) ×2 IMPLANT
SLEEVE IRRIGATION ELITE 7 (MISCELLANEOUS) ×2 IMPLANT
SLEEVE SCD COMPRESS KNEE MED (STOCKING) IMPLANT
SPIKE FLUID TRANSFER (MISCELLANEOUS) IMPLANT
SPONGE SURGIFOAM ABS GEL 12-7 (HEMOSTASIS) IMPLANT
SPONGE T-LAP 4X18 ~~LOC~~+RFID (SPONGE) ×2 IMPLANT
SUT CHROMIC 3 0 PS 2 (SUTURE) ×2 IMPLANT
SYR BULB EAR ULCER 3OZ GRN STR (SYRINGE) ×2 IMPLANT
SYR CONTROL 10ML LL (SYRINGE) ×2 IMPLANT
TOOTHBRUSH ADULT (PERSONAL CARE ITEMS) IMPLANT
TOWEL GREEN STERILE FF (TOWEL DISPOSABLE) ×2 IMPLANT
TRAY DSU PREP LF (CUSTOM PROCEDURE TRAY) ×2 IMPLANT
TUBE CONNECTING 20X1/4 (TUBING) ×2 IMPLANT
TUBING IRRIGATION (MISCELLANEOUS) ×2 IMPLANT
YANKAUER SUCT BULB TIP NO VENT (SUCTIONS) ×2 IMPLANT

## 2022-05-31 NOTE — Transfer of Care (Signed)
Immediate Anesthesia Transfer of Care Note  Patient: Ricky Hanson  Procedure(s) Performed: DENTAL RESTORATION/EXTRACTIONS  Patient Location: PACU  Anesthesia Type:General  Level of Consciousness: drowsy and patient cooperative  Airway & Oxygen Therapy: Patient Spontanous Breathing and Patient connected to face mask oxygen  Post-op Assessment: Report given to RN and Post -op Vital signs reviewed and stable  Post vital signs: Reviewed and stable  Last Vitals:  Vitals Value Taken Time  BP 180/91 05/31/22 0840  Temp    Pulse 91 05/31/22 0842  Resp 12 05/31/22 0842  SpO2 98 % 05/31/22 0842  Vitals shown include unvalidated device data.  Last Pain:  Vitals:   05/31/22 0626  TempSrc:   PainSc: 0-No pain         Complications: No notable events documented.

## 2022-05-31 NOTE — Anesthesia Postprocedure Evaluation (Signed)
Anesthesia Post Note  Patient: Ricky Hanson  Procedure(s) Performed: DENTAL RESTORATION/EXTRACTIONS     Patient location during evaluation: PACU Anesthesia Type: General Level of consciousness: awake and alert Pain management: pain level controlled Vital Signs Assessment: post-procedure vital signs reviewed and stable Respiratory status: spontaneous breathing, nonlabored ventilation, respiratory function stable and patient connected to nasal cannula oxygen Cardiovascular status: blood pressure returned to baseline and stable Postop Assessment: no apparent nausea or vomiting Anesthetic complications: no   No notable events documented.  Last Vitals:  Vitals:   05/31/22 0902 05/31/22 0915  BP: (!) 173/83 (!) 157/85  Pulse: 81 74  Resp: 17 16  Temp:  (!) 36.2 C  SpO2: 90% 98%    Last Pain:  Vitals:   05/31/22 0915  TempSrc:   PainSc: 0-No pain                 Martrell Eguia

## 2022-05-31 NOTE — Op Note (Signed)
05/31/2022  8:23 AM  PATIENT:  Ricky Hanson  71 y.o. male  PRE-OPERATIVE DIAGNOSIS:  NON-RESTORABLE TEETH # 18, 21, 22, 23, 24, 25, 26, 27, 28, 29, 30, 31 SECONDARY TO DENTAL CARIES AND PERIODONTAL DISEASE, RIGHT MANDIBULAR LINGUAL TORUS  POST-OPERATIVE DIAGNOSIS:  SAME + RIGHT MANDIBULAR BUCCAL EXOSTOSIS  PROCEDURE:  Procedure(s): EXTRACTION TEETH # 18, 21, 22, 23, 24, 25, 26, 27, 28, 29, 30, 31 WITH ALVEOLOPLASTY, REMOVAL  RIGHT MANDIBULAR LINGUAL TORUS AND RIGHT MANDIBULAR BUCCAL EXOSTOSIS   SURGEON:  Surgeon(s): Ocie Doyne, DMD  ANESTHESIA:   local and general  EBL:  minimal  DRAINS: none   SPECIMEN:  No Specimen  COUNTS:  YES  PLAN OF CARE: Discharge to home after PACU  PATIENT DISPOSITION:  PACU - hemodynamically stable.   PROCEDURE DETAILS: Dictation #   Georgia Lopes, DMD 05/31/2022 8:23 AM

## 2022-05-31 NOTE — Op Note (Signed)
NAME: Ricky Hanson, COMMON MEDICAL RECORD NO: [id number] ACCOUNT NO: [id number] DATE OF BIRTH: Oct 27, 1951 FACILITY: [facility] LOCATION: [facility] PHYSICIAN: Georgia Lopes, DDS  Operative Report   DATE OF PROCEDURE: 05/31/2022  PREOPERATIVE DIAGNOSES:  Nonrestorable teeth 18, 21, 22, 23, 24, 25, 26, 27, 28, 29, 30, 31 secondary to dental caries and periodontal disease, right mandibular lingual torus.  POSTOPERATIVE DIAGNOSES:  Nonrestorable teeth 18, 21, 22, 23, 24, 25, 26, 27, 28, 29, 30, 31 secondary to dental caries and periodontal disease, right mandibular lingual torus plus right mandibular buccal exostosis.  PROCEDURE:  Extraction teeth 18, 21, 22, 23, 24, 25, 26, 27, 28, 29, 30, 31; alveoloplasty, right and left mandible; removal right mandibular lingual torus and removal right mandibular buccal exostosis.  SURGEON:  Georgia Lopes, DDS  ANESTHESIA:  General, nasal intubation. Dr. Miguel Rota, attending.  DESCRIPTION OF PROCEDURE:  The patient was taken to the operating room and placed on the table in supine position.  General anesthesia was administered and a nasal endotracheal tube was placed and secured.  The eyes were protected and the patient was  draped for surgery.  Timeout was performed.  The posterior pharynx was suctioned and a throat pack was placed.  2% lidocaine, 1:100,000 epinephrine was infiltrated in an inferior alveolar block on the right and left sides and in buccal and lingual  infiltration around the mandible.  A bite block was placed on the right side of the mouth.  A sweetheart retractor was used to retract the tongue.  A #15 blade was used to make an incision beginning at the left mandible. In the area of tooth #18, the  incision was carried forward around teeth numbers 21, 22, 23, 24, 25 and 26 buccally and lingually.  The periosteum was reflected.  The teeth were removed using rongeurs. Teeth numbers 21 and 22 required removal of circumferential bone around the roots  as  there was inadequate bone to purchase with the forceps.  These teeth were removed with 301 elevator.  The remainder that the teeth were removed with the Ash forceps.  Then, the sockets were curetted, debrided.  The periosteum was reflected to expose  the alveolar crest which was irregular in contour.  Alveoloplasty was performed using the egg bur followed by the bone file.  Then, this area was irrigated and closed with 3-0 chromic.  Then, the right side was operated.  The 15 blade was used to make an  incision at the retromolar region laterally for 1 cm and then along the lingual and buccal aspect of teeth numbers 31, 30, 29, 28 and 27.  The periosteum was reflected from around these teeth.  The teeth were elevated with 301 elevator and removed from  the mouth with the dental forceps.  The sockets were curetted and debrided.  The periosteum was reflected to expose the alveolar crest, which was irregular in contour and had undercuts and sharp bony edges.  Alveoplasty was then performed using the egg  bur followed by the bone file, then the lingual area at the retromolar trigone region was reflected to expose the lingual torus.  This was reduced using the rongeurs and the bone file. A buccal exostosis was noted at the posterior region in the area of  tooth #32.  This was removed using the egg bur and the bone file.  Then, the right mandible was irrigated and closed with 3-0 chromic.  Additional local anesthesia was administered.  The oral cavity was irrigated and suctioned.  Throat pack was removed.   The patient was left under care of anesthesia for extubation and transport to recovery room with plans for discharge home through day surgery.  ESTIMATED BLOOD LOSS:  Minimal.  COMPLICATIONS:  None.  SPECIMENS:  No specimens.   SHW D: 05/31/2022 8:28:56 am T: 05/31/2022 9:09:00 am  JOB: [id number]/ [id number]

## 2022-05-31 NOTE — H&P (Signed)
H&P documentation  -History and Physical Reviewed  -Patient has been re-examined  -No change in the plan of care  Ricky Hanson  

## 2022-05-31 NOTE — Anesthesia Procedure Notes (Signed)
Procedure Name: Intubation Date/Time: 05/31/2022 7:44 AM  Performed by: Michele Rockers, CRNAPre-anesthesia Checklist: Patient identified, Emergency Drugs available, Suction available and Patient being monitored Patient Re-evaluated:Patient Re-evaluated prior to induction Oxygen Delivery Method: Circle system utilized Preoxygenation: Pre-oxygenation with 100% oxygen Induction Type: IV induction Ventilation: Two handed mask ventilation required and Oral airway inserted - appropriate to patient size Laryngoscope Size: Mac, 4 and Glidescope Grade View: Grade I Nasal Tubes: Nasal prep performed, Nasal Rae and Right Tube size: 7.5 mm Placement Confirmation: ETT inserted through vocal cords under direct vision, positive ETCO2 and breath sounds checked- equal and bilateral Tube secured with: Tape Dental Injury: Teeth and Oropharynx as per pre-operative assessment

## 2022-06-01 ENCOUNTER — Encounter (HOSPITAL_COMMUNITY): Payer: Self-pay | Admitting: Oral Surgery
